# Patient Record
Sex: Female | Born: 1950
Health system: Southern US, Community
[De-identification: ages and names within clinical notes are randomized; demographics above are authoritative.]

## PROBLEM LIST (undated history)

## (undated) DIAGNOSIS — K219 Gastro-esophageal reflux disease without esophagitis: Secondary | ICD-10-CM

## (undated) DIAGNOSIS — I429 Cardiomyopathy, unspecified: Secondary | ICD-10-CM

## (undated) DIAGNOSIS — C50919 Malignant neoplasm of unspecified site of unspecified female breast: Secondary | ICD-10-CM

## (undated) DIAGNOSIS — E079 Disorder of thyroid, unspecified: Secondary | ICD-10-CM

## (undated) DIAGNOSIS — I454 Nonspecific intraventricular block: Secondary | ICD-10-CM

## (undated) DIAGNOSIS — J45909 Unspecified asthma, uncomplicated: Secondary | ICD-10-CM

## (undated) HISTORY — PX: BREAST SURGERY: SHX581

## (undated) HISTORY — PX: THYROIDECTOMY: SHX17

## (undated) HISTORY — PX: TONSILLECTOMY: SUR1361

## (undated) HISTORY — DX: Cardiomyopathy, unspecified: I42.9

## (undated) HISTORY — PX: TUBAL LIGATION: SHX77

## (undated) HISTORY — PX: STERILIZATION: SHX533

---

## 1998-10-14 DIAGNOSIS — I10 Essential (primary) hypertension: Secondary | ICD-10-CM

## 1998-10-14 HISTORY — DX: Essential (primary) hypertension: I10

## 1998-10-27 ENCOUNTER — Emergency Department (HOSPITAL_COMMUNITY): Admission: EM | Admit: 1998-10-27 | Discharge: 1998-10-28 | Payer: Self-pay | Admitting: Emergency Medicine

## 1998-10-28 ENCOUNTER — Encounter: Payer: Self-pay | Admitting: Emergency Medicine

## 2000-03-26 ENCOUNTER — Encounter: Payer: Self-pay | Admitting: Obstetrics and Gynecology

## 2000-03-26 ENCOUNTER — Encounter: Admission: RE | Admit: 2000-03-26 | Discharge: 2000-03-26 | Payer: Self-pay | Admitting: Obstetrics and Gynecology

## 2000-05-08 ENCOUNTER — Other Ambulatory Visit: Admission: RE | Admit: 2000-05-08 | Discharge: 2000-05-08 | Payer: Self-pay | Admitting: Obstetrics and Gynecology

## 2000-06-20 ENCOUNTER — Encounter: Payer: Self-pay | Admitting: Cardiology

## 2000-06-21 ENCOUNTER — Inpatient Hospital Stay (HOSPITAL_COMMUNITY): Admission: EM | Admit: 2000-06-21 | Discharge: 2000-06-23 | Payer: Self-pay | Admitting: Emergency Medicine

## 2000-06-21 ENCOUNTER — Encounter: Payer: Self-pay | Admitting: Cardiology

## 2001-03-31 ENCOUNTER — Encounter: Payer: Self-pay | Admitting: Obstetrics and Gynecology

## 2001-03-31 ENCOUNTER — Encounter: Admission: RE | Admit: 2001-03-31 | Discharge: 2001-03-31 | Payer: Self-pay | Admitting: Obstetrics and Gynecology

## 2001-05-18 ENCOUNTER — Other Ambulatory Visit: Admission: RE | Admit: 2001-05-18 | Discharge: 2001-05-18 | Payer: Self-pay | Admitting: Obstetrics and Gynecology

## 2001-07-28 ENCOUNTER — Encounter: Admission: RE | Admit: 2001-07-28 | Discharge: 2001-07-28 | Payer: Self-pay | Admitting: Family Medicine

## 2001-07-28 ENCOUNTER — Encounter: Payer: Self-pay | Admitting: Family Medicine

## 2001-08-03 ENCOUNTER — Encounter: Payer: Self-pay | Admitting: Family Medicine

## 2001-08-03 ENCOUNTER — Ambulatory Visit (HOSPITAL_COMMUNITY): Admission: RE | Admit: 2001-08-03 | Discharge: 2001-08-03 | Payer: Self-pay | Admitting: Family Medicine

## 2001-08-06 ENCOUNTER — Encounter: Payer: Self-pay | Admitting: Family Medicine

## 2001-08-06 ENCOUNTER — Encounter: Admission: RE | Admit: 2001-08-06 | Discharge: 2001-08-06 | Payer: Self-pay | Admitting: Family Medicine

## 2002-04-13 ENCOUNTER — Encounter: Payer: Self-pay | Admitting: Obstetrics and Gynecology

## 2002-04-13 ENCOUNTER — Encounter: Admission: RE | Admit: 2002-04-13 | Discharge: 2002-04-13 | Payer: Self-pay | Admitting: Obstetrics and Gynecology

## 2002-10-14 DIAGNOSIS — C50919 Malignant neoplasm of unspecified site of unspecified female breast: Secondary | ICD-10-CM

## 2002-10-14 HISTORY — PX: BREAST LUMPECTOMY: SHX2

## 2002-10-14 HISTORY — DX: Malignant neoplasm of unspecified site of unspecified female breast: C50.919

## 2003-05-24 ENCOUNTER — Encounter: Admission: RE | Admit: 2003-05-24 | Discharge: 2003-05-24 | Payer: Self-pay | Admitting: Obstetrics and Gynecology

## 2003-05-24 ENCOUNTER — Encounter: Payer: Self-pay | Admitting: Obstetrics and Gynecology

## 2003-05-30 ENCOUNTER — Encounter (INDEPENDENT_AMBULATORY_CARE_PROVIDER_SITE_OTHER): Payer: Self-pay | Admitting: *Deleted

## 2003-05-30 ENCOUNTER — Encounter: Payer: Self-pay | Admitting: Obstetrics and Gynecology

## 2003-05-30 ENCOUNTER — Encounter: Admission: RE | Admit: 2003-05-30 | Discharge: 2003-05-30 | Payer: Self-pay | Admitting: Obstetrics and Gynecology

## 2003-06-02 ENCOUNTER — Encounter (HOSPITAL_COMMUNITY): Admission: RE | Admit: 2003-06-02 | Discharge: 2003-08-31 | Payer: Self-pay | Admitting: Surgery

## 2003-06-02 ENCOUNTER — Encounter: Payer: Self-pay | Admitting: Surgery

## 2003-06-03 ENCOUNTER — Encounter: Payer: Self-pay | Admitting: Surgery

## 2003-06-08 ENCOUNTER — Encounter: Payer: Self-pay | Admitting: Surgery

## 2003-06-08 ENCOUNTER — Encounter: Admission: RE | Admit: 2003-06-08 | Discharge: 2003-06-08 | Payer: Self-pay | Admitting: Surgery

## 2003-06-09 ENCOUNTER — Encounter (INDEPENDENT_AMBULATORY_CARE_PROVIDER_SITE_OTHER): Payer: Self-pay | Admitting: *Deleted

## 2003-06-09 ENCOUNTER — Ambulatory Visit (HOSPITAL_BASED_OUTPATIENT_CLINIC_OR_DEPARTMENT_OTHER): Admission: RE | Admit: 2003-06-09 | Discharge: 2003-06-09 | Payer: Self-pay | Admitting: Surgery

## 2003-06-09 ENCOUNTER — Encounter: Admission: RE | Admit: 2003-06-09 | Discharge: 2003-06-09 | Payer: Self-pay | Admitting: Surgery

## 2003-06-09 ENCOUNTER — Encounter: Payer: Self-pay | Admitting: Surgery

## 2003-06-16 ENCOUNTER — Ambulatory Visit: Admission: RE | Admit: 2003-06-16 | Discharge: 2003-08-13 | Payer: Self-pay | Admitting: Radiation Oncology

## 2003-06-27 ENCOUNTER — Encounter: Admission: RE | Admit: 2003-06-27 | Discharge: 2003-06-27 | Payer: Self-pay | Admitting: Oncology

## 2003-06-27 ENCOUNTER — Encounter: Payer: Self-pay | Admitting: Oncology

## 2003-07-01 ENCOUNTER — Ambulatory Visit (HOSPITAL_COMMUNITY): Admission: RE | Admit: 2003-07-01 | Discharge: 2003-07-01 | Payer: Self-pay | Admitting: Oncology

## 2003-07-01 ENCOUNTER — Encounter: Payer: Self-pay | Admitting: Oncology

## 2003-10-04 ENCOUNTER — Ambulatory Visit: Admission: RE | Admit: 2003-10-04 | Discharge: 2003-11-30 | Payer: Self-pay | Admitting: Radiation Oncology

## 2003-10-11 ENCOUNTER — Encounter: Admission: RE | Admit: 2003-10-11 | Discharge: 2003-10-11 | Payer: Self-pay | Admitting: Radiation Oncology

## 2003-11-08 ENCOUNTER — Encounter: Admission: RE | Admit: 2003-11-08 | Discharge: 2003-11-08 | Payer: Self-pay | Admitting: Oncology

## 2003-11-14 ENCOUNTER — Ambulatory Visit (HOSPITAL_COMMUNITY): Admission: RE | Admit: 2003-11-14 | Discharge: 2003-11-14 | Payer: Self-pay | Admitting: Oncology

## 2003-11-26 ENCOUNTER — Emergency Department (HOSPITAL_COMMUNITY): Admission: EM | Admit: 2003-11-26 | Discharge: 2003-11-26 | Payer: Self-pay

## 2003-11-28 ENCOUNTER — Ambulatory Visit (HOSPITAL_COMMUNITY): Admission: RE | Admit: 2003-11-28 | Discharge: 2003-11-28 | Payer: Self-pay | Admitting: Gastroenterology

## 2003-12-29 ENCOUNTER — Ambulatory Visit: Admission: RE | Admit: 2003-12-29 | Discharge: 2003-12-29 | Payer: Self-pay | Admitting: Radiation Oncology

## 2004-05-29 ENCOUNTER — Encounter: Admission: RE | Admit: 2004-05-29 | Discharge: 2004-05-29 | Payer: Self-pay | Admitting: Surgery

## 2004-06-11 ENCOUNTER — Encounter: Admission: RE | Admit: 2004-06-11 | Discharge: 2004-06-11 | Payer: Self-pay | Admitting: Family Medicine

## 2004-07-12 ENCOUNTER — Ambulatory Visit: Admission: RE | Admit: 2004-07-12 | Discharge: 2004-07-12 | Payer: Self-pay | Admitting: Radiation Oncology

## 2004-09-28 ENCOUNTER — Inpatient Hospital Stay (HOSPITAL_COMMUNITY): Admission: EM | Admit: 2004-09-28 | Discharge: 2004-10-02 | Payer: Self-pay | Admitting: Emergency Medicine

## 2004-10-29 ENCOUNTER — Encounter: Admission: RE | Admit: 2004-10-29 | Discharge: 2004-10-29 | Payer: Self-pay | Admitting: Family Medicine

## 2004-11-13 ENCOUNTER — Ambulatory Visit: Payer: Self-pay | Admitting: Oncology

## 2005-02-20 ENCOUNTER — Encounter: Admission: RE | Admit: 2005-02-20 | Discharge: 2005-02-20 | Payer: Self-pay | Admitting: Family Medicine

## 2005-03-20 ENCOUNTER — Encounter: Admission: RE | Admit: 2005-03-20 | Discharge: 2005-03-20 | Payer: Self-pay | Admitting: Endocrinology

## 2005-03-20 ENCOUNTER — Other Ambulatory Visit: Admission: RE | Admit: 2005-03-20 | Discharge: 2005-03-20 | Payer: Self-pay | Admitting: Interventional Radiology

## 2005-03-20 ENCOUNTER — Encounter (INDEPENDENT_AMBULATORY_CARE_PROVIDER_SITE_OTHER): Payer: Self-pay | Admitting: *Deleted

## 2005-05-09 ENCOUNTER — Ambulatory Visit (HOSPITAL_COMMUNITY): Admission: RE | Admit: 2005-05-09 | Discharge: 2005-05-09 | Payer: Self-pay | Admitting: Oncology

## 2005-05-17 ENCOUNTER — Encounter (INDEPENDENT_AMBULATORY_CARE_PROVIDER_SITE_OTHER): Payer: Self-pay | Admitting: Specialist

## 2005-05-17 ENCOUNTER — Ambulatory Visit (HOSPITAL_COMMUNITY): Admission: RE | Admit: 2005-05-17 | Discharge: 2005-05-18 | Payer: Self-pay | Admitting: Surgery

## 2005-05-22 ENCOUNTER — Emergency Department (HOSPITAL_COMMUNITY): Admission: EM | Admit: 2005-05-22 | Discharge: 2005-05-23 | Payer: Self-pay | Admitting: Emergency Medicine

## 2005-05-30 ENCOUNTER — Encounter: Admission: RE | Admit: 2005-05-30 | Discharge: 2005-05-30 | Payer: Self-pay | Admitting: Oncology

## 2005-06-03 ENCOUNTER — Ambulatory Visit: Payer: Self-pay | Admitting: Oncology

## 2005-12-09 ENCOUNTER — Ambulatory Visit: Payer: Self-pay | Admitting: Oncology

## 2006-05-29 ENCOUNTER — Ambulatory Visit: Payer: Self-pay | Admitting: Oncology

## 2006-06-02 ENCOUNTER — Encounter: Admission: RE | Admit: 2006-06-02 | Discharge: 2006-06-02 | Payer: Self-pay | Admitting: Oncology

## 2006-06-03 LAB — CBC WITH DIFFERENTIAL/PLATELET
Basophils Absolute: 0 10*3/uL (ref 0.0–0.1)
HCT: 40.9 % (ref 34.8–46.6)
HGB: 14.2 g/dL (ref 11.6–15.9)
LYMPH%: 30.2 % (ref 14.0–48.0)
MCH: 29.8 pg (ref 26.0–34.0)
MCHC: 34.7 g/dL (ref 32.0–36.0)
MONO#: 0.4 10*3/uL (ref 0.1–0.9)
NEUT%: 60 % (ref 39.6–76.8)
Platelets: 231 10*3/uL (ref 145–400)
lymph#: 1.8 10*3/uL (ref 0.9–3.3)

## 2006-06-03 LAB — COMPREHENSIVE METABOLIC PANEL
BUN: 16 mg/dL (ref 6–23)
CO2: 23 mEq/L (ref 19–32)
Calcium: 9.1 mg/dL (ref 8.4–10.5)
Chloride: 105 mEq/L (ref 96–112)
Creatinine, Ser: 0.67 mg/dL (ref 0.40–1.20)
Total Bilirubin: 0.6 mg/dL (ref 0.3–1.2)

## 2006-06-03 LAB — CANCER ANTIGEN 27.29: CA 27.29: 7 U/mL (ref 0–39)

## 2006-07-17 ENCOUNTER — Ambulatory Visit (HOSPITAL_COMMUNITY): Admission: RE | Admit: 2006-07-17 | Discharge: 2006-07-17 | Payer: Self-pay | Admitting: Oncology

## 2006-12-08 ENCOUNTER — Ambulatory Visit: Payer: Self-pay | Admitting: Oncology

## 2006-12-10 LAB — COMPREHENSIVE METABOLIC PANEL
ALT: 11 U/L (ref 0–35)
AST: 12 U/L (ref 0–37)
CO2: 26 mEq/L (ref 19–32)
Creatinine, Ser: 0.63 mg/dL (ref 0.40–1.20)
Total Bilirubin: 0.5 mg/dL (ref 0.3–1.2)

## 2006-12-10 LAB — CANCER ANTIGEN 27.29: CA 27.29: 12 U/mL (ref 0–39)

## 2006-12-10 LAB — CBC WITH DIFFERENTIAL/PLATELET
BASO%: 0.5 % (ref 0.0–2.0)
EOS%: 3.1 % (ref 0.0–7.0)
HCT: 41.4 % (ref 34.8–46.6)
LYMPH%: 27.8 % (ref 14.0–48.0)
MCH: 29.8 pg (ref 26.0–34.0)
MCHC: 34.7 g/dL (ref 32.0–36.0)
MCV: 85.8 fL (ref 81.0–101.0)
MONO%: 7.3 % (ref 0.0–13.0)
NEUT%: 61.3 % (ref 39.6–76.8)
Platelets: 245 10*3/uL (ref 145–400)

## 2007-05-29 ENCOUNTER — Ambulatory Visit: Payer: Self-pay | Admitting: Oncology

## 2007-06-03 LAB — CBC WITH DIFFERENTIAL/PLATELET
Basophils Absolute: 0 10*3/uL (ref 0.0–0.1)
EOS%: 2 % (ref 0.0–7.0)
HCT: 37.8 % (ref 34.8–46.6)
HGB: 13.3 g/dL (ref 11.6–15.9)
MCH: 30.5 pg (ref 26.0–34.0)
NEUT%: 64 % (ref 39.6–76.8)
lymph#: 2.1 10*3/uL (ref 0.9–3.3)

## 2007-06-03 LAB — COMPREHENSIVE METABOLIC PANEL
AST: 14 U/L (ref 0–37)
BUN: 16 mg/dL (ref 6–23)
CO2: 24 mEq/L (ref 19–32)
Calcium: 9 mg/dL (ref 8.4–10.5)
Chloride: 105 mEq/L (ref 96–112)
Creatinine, Ser: 0.57 mg/dL (ref 0.40–1.20)
Glucose, Bld: 102 mg/dL — ABNORMAL HIGH (ref 70–99)

## 2007-06-03 LAB — CANCER ANTIGEN 27.29: CA 27.29: 5 U/mL (ref 0–39)

## 2007-06-05 ENCOUNTER — Encounter: Admission: RE | Admit: 2007-06-05 | Discharge: 2007-06-05 | Payer: Self-pay | Admitting: Oncology

## 2007-12-02 ENCOUNTER — Ambulatory Visit: Payer: Self-pay | Admitting: Oncology

## 2007-12-04 LAB — CBC WITH DIFFERENTIAL/PLATELET
Eosinophils Absolute: 0.2 10*3/uL (ref 0.0–0.5)
HCT: 40.4 % (ref 34.8–46.6)
HGB: 14.1 g/dL (ref 11.6–15.9)
LYMPH%: 30.6 % (ref 14.0–48.0)
MONO#: 0.5 10*3/uL (ref 0.1–0.9)
NEUT#: 3.5 10*3/uL (ref 1.5–6.5)
NEUT%: 57.9 % (ref 39.6–76.8)
Platelets: 237 10*3/uL (ref 145–400)
WBC: 6 10*3/uL (ref 3.9–10.0)
lymph#: 1.8 10*3/uL (ref 0.9–3.3)

## 2007-12-04 LAB — COMPREHENSIVE METABOLIC PANEL
ALT: 11 U/L (ref 0–35)
Alkaline Phosphatase: 47 U/L (ref 39–117)
CO2: 24 mEq/L (ref 19–32)
Creatinine, Ser: 0.62 mg/dL (ref 0.40–1.20)
Sodium: 141 mEq/L (ref 135–145)
Total Bilirubin: 0.4 mg/dL (ref 0.3–1.2)
Total Protein: 6.7 g/dL (ref 6.0–8.3)

## 2007-12-04 LAB — CANCER ANTIGEN 27.29: CA 27.29: 7 U/mL (ref 0–39)

## 2007-12-30 ENCOUNTER — Encounter: Admission: RE | Admit: 2007-12-30 | Discharge: 2007-12-30 | Payer: Self-pay | Admitting: Family Medicine

## 2008-01-06 ENCOUNTER — Encounter: Admission: RE | Admit: 2008-01-06 | Discharge: 2008-01-06 | Payer: Self-pay | Admitting: Family Medicine

## 2008-01-22 ENCOUNTER — Encounter: Admission: RE | Admit: 2008-01-22 | Discharge: 2008-01-22 | Payer: Self-pay | Admitting: Specialist

## 2008-05-29 ENCOUNTER — Ambulatory Visit: Payer: Self-pay | Admitting: Oncology

## 2008-06-02 LAB — CBC WITH DIFFERENTIAL/PLATELET
Basophils Absolute: 0 10*3/uL (ref 0.0–0.1)
EOS%: 3.8 % (ref 0.0–7.0)
HGB: 14 g/dL (ref 11.6–15.9)
LYMPH%: 29.8 % (ref 14.0–48.0)
MCH: 29.9 pg (ref 26.0–34.0)
MCV: 85.2 fL (ref 81.0–101.0)
MONO%: 8.9 % (ref 0.0–13.0)
RDW: 11.6 % (ref 11.3–14.5)

## 2008-06-03 LAB — COMPREHENSIVE METABOLIC PANEL
AST: 12 U/L (ref 0–37)
Alkaline Phosphatase: 48 U/L (ref 39–117)
BUN: 19 mg/dL (ref 6–23)
Creatinine, Ser: 0.71 mg/dL (ref 0.40–1.20)
Potassium: 4.3 mEq/L (ref 3.5–5.3)
Total Bilirubin: 0.4 mg/dL (ref 0.3–1.2)

## 2008-06-06 ENCOUNTER — Encounter: Admission: RE | Admit: 2008-06-06 | Discharge: 2008-06-06 | Payer: Self-pay | Admitting: Oncology

## 2008-11-29 ENCOUNTER — Ambulatory Visit: Payer: Self-pay | Admitting: Oncology

## 2008-12-01 ENCOUNTER — Encounter: Admission: RE | Admit: 2008-12-01 | Discharge: 2008-12-01 | Payer: Self-pay | Admitting: Oncology

## 2008-12-01 LAB — CBC WITH DIFFERENTIAL/PLATELET
BASO%: 0.1 % (ref 0.0–2.0)
EOS%: 3.1 % (ref 0.0–7.0)
HCT: 40.8 % (ref 34.8–46.6)
LYMPH%: 24.6 % (ref 14.0–48.0)
MCH: 29.9 pg (ref 26.0–34.0)
MCHC: 34 g/dL (ref 32.0–36.0)
MCV: 88 fL (ref 81.0–101.0)
MONO%: 7.1 % (ref 0.0–13.0)
NEUT%: 65.1 % (ref 39.6–76.8)
Platelets: 212 10*3/uL (ref 145–400)
lymph#: 2.2 10*3/uL (ref 0.9–3.3)

## 2008-12-01 LAB — COMPREHENSIVE METABOLIC PANEL
ALT: 18 U/L (ref 0–35)
AST: 18 U/L (ref 0–37)
Alkaline Phosphatase: 50 U/L (ref 39–117)
BUN: 24 mg/dL — ABNORMAL HIGH (ref 6–23)
Creatinine, Ser: 0.61 mg/dL (ref 0.40–1.20)
Total Bilirubin: 0.6 mg/dL (ref 0.3–1.2)

## 2009-05-04 ENCOUNTER — Ambulatory Visit: Payer: Self-pay | Admitting: Oncology

## 2009-05-09 LAB — CBC WITH DIFFERENTIAL/PLATELET
BASO%: 0.2 % (ref 0.0–2.0)
LYMPH%: 22.3 % (ref 14.0–49.7)
MCHC: 33.9 g/dL (ref 31.5–36.0)
MONO#: 0.6 10*3/uL (ref 0.1–0.9)
RBC: 4.53 10*6/uL (ref 3.70–5.45)
WBC: 9.7 10*3/uL (ref 3.9–10.3)
lymph#: 2.2 10*3/uL (ref 0.9–3.3)

## 2009-05-09 LAB — COMPREHENSIVE METABOLIC PANEL
ALT: 16 U/L (ref 0–35)
AST: 18 U/L (ref 0–37)
Alkaline Phosphatase: 33 U/L — ABNORMAL LOW (ref 39–117)
Calcium: 9.3 mg/dL (ref 8.4–10.5)
Chloride: 105 mEq/L (ref 96–112)
Creatinine, Ser: 0.75 mg/dL (ref 0.40–1.20)

## 2009-05-09 LAB — RESEARCH LABS

## 2009-06-07 ENCOUNTER — Encounter: Admission: RE | Admit: 2009-06-07 | Discharge: 2009-06-07 | Payer: Self-pay | Admitting: Oncology

## 2009-09-01 DIAGNOSIS — J029 Acute pharyngitis, unspecified: Secondary | ICD-10-CM

## 2009-09-01 HISTORY — DX: Acute pharyngitis, unspecified: J02.9

## 2009-10-20 ENCOUNTER — Ambulatory Visit: Payer: Self-pay | Admitting: Oncology

## 2009-10-25 LAB — CBC WITH DIFFERENTIAL/PLATELET
EOS%: 4.1 % (ref 0.0–7.0)
Eosinophils Absolute: 0.4 10*3/uL (ref 0.0–0.5)
LYMPH%: 26.7 % (ref 14.0–49.7)
MCH: 30.2 pg (ref 25.1–34.0)
MCHC: 33.4 g/dL (ref 31.5–36.0)
MCV: 90.2 fL (ref 79.5–101.0)
MONO%: 7.7 % (ref 0.0–14.0)
Platelets: 219 10*3/uL (ref 145–400)
RBC: 4.42 10*6/uL (ref 3.70–5.45)

## 2009-10-25 LAB — COMPREHENSIVE METABOLIC PANEL
AST: 19 U/L (ref 0–37)
Alkaline Phosphatase: 30 U/L — ABNORMAL LOW (ref 39–117)
Glucose, Bld: 85 mg/dL (ref 70–99)
Sodium: 137 mEq/L (ref 135–145)
Total Bilirubin: 0.8 mg/dL (ref 0.3–1.2)
Total Protein: 6.5 g/dL (ref 6.0–8.3)

## 2009-10-25 LAB — RESEARCH LABS

## 2009-10-25 LAB — VITAMIN D 25 HYDROXY (VIT D DEFICIENCY, FRACTURES): Vit D, 25-Hydroxy: 61 ng/mL (ref 30–89)

## 2010-01-25 DIAGNOSIS — J302 Other seasonal allergic rhinitis: Secondary | ICD-10-CM | POA: Insufficient documentation

## 2010-01-25 DIAGNOSIS — M899 Disorder of bone, unspecified: Secondary | ICD-10-CM | POA: Insufficient documentation

## 2010-01-25 DIAGNOSIS — R5383 Other fatigue: Secondary | ICD-10-CM | POA: Insufficient documentation

## 2010-01-25 DIAGNOSIS — E039 Hypothyroidism, unspecified: Secondary | ICD-10-CM | POA: Insufficient documentation

## 2010-01-25 HISTORY — DX: Hypothyroidism, unspecified: E03.9

## 2010-01-25 HISTORY — DX: Other seasonal allergic rhinitis: J30.2

## 2010-01-25 HISTORY — DX: Other fatigue: R53.83

## 2010-01-25 HISTORY — DX: Disorder of bone, unspecified: M89.9

## 2010-03-20 ENCOUNTER — Encounter: Payer: Self-pay | Admitting: Emergency Medicine

## 2010-03-20 DIAGNOSIS — R509 Fever, unspecified: Secondary | ICD-10-CM | POA: Insufficient documentation

## 2010-03-20 HISTORY — DX: Fever, unspecified: R50.9

## 2010-04-10 ENCOUNTER — Ambulatory Visit: Payer: Self-pay | Admitting: Emergency Medicine

## 2010-04-10 DIAGNOSIS — K219 Gastro-esophageal reflux disease without esophagitis: Secondary | ICD-10-CM

## 2010-04-10 DIAGNOSIS — C50919 Malignant neoplasm of unspecified site of unspecified female breast: Secondary | ICD-10-CM

## 2010-04-10 DIAGNOSIS — J45909 Unspecified asthma, uncomplicated: Secondary | ICD-10-CM | POA: Insufficient documentation

## 2010-04-10 DIAGNOSIS — R490 Dysphonia: Secondary | ICD-10-CM

## 2010-04-10 DIAGNOSIS — J309 Allergic rhinitis, unspecified: Secondary | ICD-10-CM | POA: Insufficient documentation

## 2010-04-10 HISTORY — DX: Malignant neoplasm of unspecified site of unspecified female breast: C50.919

## 2010-04-10 HISTORY — DX: Gastro-esophageal reflux disease without esophagitis: K21.9

## 2010-04-20 ENCOUNTER — Ambulatory Visit: Payer: Self-pay | Admitting: Emergency Medicine

## 2010-04-24 ENCOUNTER — Ambulatory Visit: Payer: Self-pay | Admitting: Emergency Medicine

## 2010-06-08 ENCOUNTER — Encounter: Admission: RE | Admit: 2010-06-08 | Discharge: 2010-06-08 | Payer: Self-pay | Admitting: Oncology

## 2010-08-06 ENCOUNTER — Ambulatory Visit: Payer: Self-pay | Admitting: Emergency Medicine

## 2010-11-13 NOTE — Letter (Signed)
Summary: [facility]  [facility]   Imported By: [employer] 04/26/2010 08:34:39  _____________________________________________________________________  External Attachment:    Type:   Image     Comment:   External Document

## 2010-11-13 NOTE — Miscellaneous (Signed)
Summary: Orders Update pft charges  Clinical Lists Changes  Orders: Added new Service order of Carbon Monoxide diffusing w/capacity (94720) - Signed Added new Service order of Lung Volumes (94240) - Signed Added new Service order of Spirometry (Pre & Post) (94060) - Signed 

## 2010-11-13 NOTE — Assessment & Plan Note (Signed)
Summary: asthma   Visit Type:  Follow-up Copy to:  Dr. Waynard Edwards Primary Provider/Referring Provider:  Dr. Rodrigo Ran  CC:  3 month follow up , review ventolin usage, and denies cough.  History of Present Illness: 60 yo woman, never smoker, hx allergic rhinitis and asthma. The asthma was dx as a child, was on meds at that time. Improved and then returned in her 20's. Has been very well controlled and intermittant for years. Then over the last 2 years she has had increased SABA use. Since December '10 she has at least 3 bouts of flaring, often when outside. Also woke her from sleep a few times, requiring her to use her SABA. Has been treated with pred. Three weeks ago she started with cough, non-prod, some PND, hoarse voice. Developed wheeze, lost her voice. Then was treated with prednisone. CXR clear 03/20/10. Many of the symptoms are better but still with cough and hoarse voice. Was restarted on Alvesco two times a day. Her SABA use has decreased since she started it.   Triggers seem to include cats, smoke, working outdoors in the heat, cleaning solutions, newspaper ink, perfumes. Has trouble with allergies more in the Fall.   ROS - also significant for some GERD symptoms. Has gained 20 lbs over the last 2 yrs.   ROV 04/24/10 -- returns for f/u. Cough and hoarse voice are better. Last time we stopped Alvesco, she may have lost some ground in that she is using her SABA 2 -3 days out of the week, rarely 2x a day. We didn't agressively treat GERD or allergies.   ROV 08/06/10 -- mild AFL, clinical asthma and UA iritation. UA symptoms are better. We kept a record of her SABA use, rarely used it 2x in a day, then would go many days without it. Cough and hoarse voice are better.   Preventive Screening-Counseling & Management  Alcohol-Tobacco     Smoking Status: never  Current Medications (verified): 1)  Tamoxifen Citrate 20 Mg Tabs (Tamoxifen Citrate) .Marland Kitchen.. 1 By Mouth Daily 2)  Benicar 20 Mg Tabs  (Olmesartan Medoxomil) .Marland Kitchen.. 1 By Mouth Daily 3)  Synthroid 112 Mcg Tabs (Levothyroxine Sodium) .Marland Kitchen.. 1 By Mouth Daily 4)  Aspirin 81 Mg Tabs (Aspirin) .Marland Kitchen.. 1 By Mouth Daily 5)  Multivitamins  Tabs (Multiple Vitamin) .Marland Kitchen.. 1 By Mouth Daily 6)  Fish Oil 1000 Mg Caps (Omega-3 Fatty Acids) .Marland Kitchen.. 1 By Mouth Daily 7)  Nystatin 100000 Unit/ml Susp (Nystatin) .Marland Kitchen.. 1 Tsp Four Times Daily As Needed 8)  Ventolin Hfa 108 (90 Base) Mcg/act Aers (Albuterol Sulfate) .... 2 Puffs Four Times Daily As Needed  Allergies (verified): 1)  ! * Sulfa  Vital Signs:  Patient profile:   60 year old female Height:      64 inches Weight:      167.6 pounds BMI:     28.87 O2 Sat:      94 % on Room air Temp:     98 degrees F oral Pulse rate:   94 / minute BP sitting:   140 / 80  (left arm)  Vitals Entered By: Renold Genta RCP, LPN (August 06, 2010 3:32 PM)  O2 Flow:  Room air CC: 3 month follow up , review ventolin usage, denies cough Comments Medications reviewed with patient Renold Genta RCP, LPN  August 06, 2010 3:36 PM    Physical Exam  General:  normal appearance and healthy appearing.   Head:  normocephalic and atraumatic Eyes:  conjunctiva and sclera  clear Nose:  no deformity, discharge, inflammation, or lesions Mouth:  no deformity or lesions, narrow post pharynx.  Neck:  no masses, thyromegaly, or abnormal cervical nodes, no stridor Lungs:  clear bilaterally to auscultation and percussion Heart:  regular rate and rhythm, S1, S2 without murmurs, rubs, gallops, or clicks Abdomen:  not-examined Msk:  no deformity or scoliosis noted with normal posture Extremities:  no clubbing, cyanosis, edema, or deformity noted Neurologic:  non-focal Psych:  alert and cooperative; normal mood and affect; normal attention span and concentration   Impression & Recommendations:  Problem # 1:  ASTHMA (ICD-493.90) Mild intermitant asthma.  - as needed SABA - rov 1 yr or as needed   Other Orders: Est.  Patient Level III (16109)  Patient Instructions: 1)  Please continue your Ventolin as needed  2)  Follow with Dr Delton Coombes in 1 year or as needed  Prescriptions: VENTOLIN HFA 108 (90 BASE) MCG/ACT AERS (ALBUTEROL SULFATE) 2 puffs four times daily as needed  #1 x 11   Entered and Authorized by:   Leslye Peer MD   Signed by:   Leslye Peer MD on 08/06/2010   Method used:   Electronically to        [facility]* (retail)       [address] [place], [place]  [address]       Ph: [phone] or [phone]       Fax: [phone]   RxID:   [id number]

## 2010-11-13 NOTE — Assessment & Plan Note (Signed)
Summary: asthma, UA irritation   Visit Type:  Follow-up Copy to:  Dr. Waynard Edwards Primary Provider/Referring Provider:  Dr. Rodrigo Ran  CC:  Asthma follow-up with PFt's. The patient says her voice came back within 4-5 days of stopping the Alvesco. She is using her Ventolin inhaler 3-4 times daily.Allison Clark  History of Present Illness: 60 yo woman, never smoker, hx allergic rhinitis and asthma. The asthma was dx as a child, was on meds at that time. Improved and then returned in her 20's. Has been very well controlled and intermittant for years. Then over the last 2 years she has had increased SABA use. Since December '10 she has at least 3 bouts of flaring, often when outside. Also woke her from sleep a few times, requiring her to use her SABA. Has been treated with pred. Three weeks ago she started with cough, non-prod, some PND, hoarse voice. Developed wheeze, lost her voice. Then was treated with prednisone. CXR clear 03/20/10. Many of the symptoms are better but still with cough and hoarse voice. Was restarted on Alvesco two times a day. Her SABA use has decreased since she started it.   Triggers seem to include cats, smoke, working outdoors in the heat, cleaning solutions, newspaper ink, perfumes. Has trouble with allergies more in the Fall.   ROS - also significant for some GERD symptoms. Has gained 20 lbs over the last 2 yrs.   ROV 04/24/10 -- returns for f/u. Cough and hoarse voice are better. Last time we stopped Alvesco, she may have lost some ground in that she is using her SABA 2 -3 days out of the week, rarely 2x a day. We didn't agressively treat GERD or allergies.   Current Medications (verified): 1)  Tamoxifen Citrate 20 Mg Tabs (Tamoxifen Citrate) .Allison Clark.. 1 By Mouth Daily 2)  Benicar 20 Mg Tabs (Olmesartan Medoxomil) .Allison Clark.. 1 By Mouth Daily 3)  Synthroid 112 Mcg Tabs (Levothyroxine Sodium) .Allison Clark.. 1 By Mouth Daily 4)  Aspirin 81 Mg Tabs (Aspirin) .Allison Clark.. 1 By Mouth Daily 5)  Multivitamins  Tabs  (Multiple Vitamin) .Allison Clark.. 1 By Mouth Daily 6)  Fish Oil 1000 Mg Caps (Omega-3 Fatty Acids) .Allison Clark.. 1 By Mouth Daily 7)  Nystatin 100000 Unit/ml Susp (Nystatin) .Allison Clark.. 1 Tsp Four Times Daily As Needed 8)  Ventolin Hfa 108 (90 Base) Mcg/act Aers (Albuterol Sulfate) .... 2 Puffs Four Times Daily As Needed  Allergies (verified): 1)  ! * Sulfa  Vital Signs:  Patient profile:   60 year old female Height:      64 inches (162.56 cm) Weight:      165 pounds (75 kg) BMI:     28.42 O2 Sat:      92 % on Room air Temp:     98.2 degrees F (36.78 degrees C) oral Pulse rate:   74 / minute BP sitting:   130 / 70  (right arm) Cuff size:   regular  O2 Sat at Rest %:  92 O2 Flow:  Room air CC: Asthma follow-up with PFt's. The patient says her voice came back within 4-5 days of stopping the Alvesco. She is using her Ventolin inhaler 3-4 times daily. Comments Medications reviewed. Daytime phone verofied. Michel Bickers [facility]  April 24, 2010 3:37 PM   Physical Exam  General:  normal appearance and healthy appearing.   Head:  normocephalic and atraumatic Eyes:  conjunctiva and sclera clear Nose:  no deformity, discharge, inflammation, or lesions Mouth:  no deformity or lesions, narrow post pharynx.  Neck:  no masses, thyromegaly, or abnormal cervical nodes, no stridor Lungs:  clear bilaterally to auscultation and percussion Heart:  regular rate and rhythm, S1, S2 without murmurs, rubs, gallops, or clicks Abdomen:  not-examined Msk:  no deformity or scoliosis noted with normal posture Extremities:  no clubbing, cyanosis, edema, or deformity noted Neurologic:  non-focal Psych:  alert and cooperative; normal mood and affect; normal attention span and concentration   Pulmonary Function Test Date: 04/20/2010 Height (in.): 64 Gender: Female  Pre-Spirometry FVC    Value: 2.49 L/min   Pred: 3.09 L/min     % Pred: 80 % FEV1    Value: 1.82 L     Pred: 2.29 L     % Pred: 80 % FEV1/FVC  Value: 73 %     Pred: 74  %    FEF 25-75  Value: 1.39 L/min   Pred: 2.62 L/min     % Pred: 53 %  Post-Spirometry FVC    Value: 2.92 L/min   Pred: 3.09 L/min     % Pred: 95 % FEV1    Value: 2.13 L     Pred: 2.29 L     % Pred: 93 % FEV1/FVC  Value: 73 %     Pred: 74 %    FEF 25-75  Value: 1.63 L/min   Pred: 2.62 L/min     % Pred: 62 %  Lung Volumes TLC    Value: 4.48 L   % Pred: 91 % RV    Value: 1.88 L   % Pred: 102 % DLCO    Value: 20.5 %   % Pred: 95 % DLCO/VA  Value: 4.87 %   % Pred: 130 %  Comments: Mild AFL based on BD reponsiveness. Volumes and DLCO normal. RSB  Impression & Recommendations:  Problem # 1:  ASTHMA (ICD-493.90) Confirmed on PFT today. Seems to fall into mild intermittant category although she has periods wher her SABA use goes up.  - asked her to concentrate on trigger avoidance (cats, cleaning products, etc) - keep a log of albuterol use to review next time - SABA as needed  - rov 3 mo  Problem # 2:  HOARSENESS (ZOX-096.04)  Improved. She will be high risk for recurrance in setting GERD, allergies.   Orders: Est. Patient Level IV (54098)  Medications Added to Medication List This Visit: 1)  Ventolin Hfa 108 (90 Base) Mcg/act Aers (Albuterol sulfate) .... 2 puffs four times daily as needed  Patient Instructions: 1)  Use your albuterol inhaler up to every 4 hours if needed for shortness of breath 2)  Do not restart Alvesco  3)  Follow up with Dr Delton Coombes in three months with a log of how frequently you are using your albuterol.  4)  Call our office to be seen sooner if you have worsening of your breathing or any other problems.

## 2010-11-13 NOTE — Assessment & Plan Note (Signed)
Summary: asthma, hoarseness   Visit Type:  Initial Consult Copy to:  Dr. Waynard Edwards Primary Provider/Referring Provider:  Dr. Rodrigo Ran  CC:  Pulmonary consult for asthma.  The patient c/o asthma flare. Increased sob  due to warmer weather..  History of Present Illness: 60 yo woman, never smoker, hx allergic rhinitis and asthma. The asthma was dx as a child, was on meds at that time. Improved and then returned in her 20's. Has been very well controlled and intermittant for years. Then over the last 2 years she has had increased SABA use. Since December '10 she has at least 3 bouts of flaring, often when outside. Also woke her from sleep a few times, requiring her to use her SABA. Has been treated with pred. Three weeks ago she started with cough, non-prod, some PND, hoarse voice. Developed wheeze, lost her voice. Then was treated with prednisone. CXR clear 03/20/10. Many of the symptoms are better but still with cough and hoarse voice. Was restarted on Alvesco two times a day. Her SABA use has decreased since she started it.   Triggers seem to include cats, smoke, working outdoors in the heat, cleaning solutions, newspaper ink, perfumes. Has trouble with allergies more in the Fall.   ROS - also significant for some GERD symptoms. Has gained 20 lbs over the last 2 yrs.   Preventive Screening-Counseling & Management  Alcohol-Tobacco     Alcohol drinks/day: 0     Smoking Status: never  Current Medications (verified): 1)  Tamoxifen Citrate 20 Mg Tabs (Tamoxifen Citrate) .Marland Kitchen.. 1 By Mouth Daily 2)  Benicar 20 Mg Tabs (Olmesartan Medoxomil) .Marland Kitchen.. 1 By Mouth Daily 3)  Synthroid 112 Mcg Tabs (Levothyroxine Sodium) .Marland Kitchen.. 1 By Mouth Daily 4)  Aspirin 81 Mg Tabs (Aspirin) .Marland Kitchen.. 1 By Mouth Daily 5)  Alvesco 80 Mcg/act Aers (Ciclesonide) .Marland Kitchen.. 1 Puff Two Times A Day 6)  Multivitamins  Tabs (Multiple Vitamin) .Marland Kitchen.. 1 By Mouth Daily 7)  Fish Oil 1000 Mg Caps (Omega-3 Fatty Acids) .Marland Kitchen.. 1 By Mouth Daily 8)   Nystatin 100000 Unit/ml Susp (Nystatin) .Marland Kitchen.. 1 Tsp Four Times Daily As Needed  Allergies (verified): 1)  ! * Sulfa  Past History:  Past Medical History: Current Problems:  Hx of BREAST CANCER (ICD-174.9) ASTHMA (ICD-493.90) ALLERGIC RHINITIS (ICD-477.9)  Past Surgical History: Lumpectomy in left breast in 2004 Tonsillectomy age 57 Tubal Ligation in 1986 Thyroidectomy in 2006  Family History: Family History Prostate Cancer---brother Heart disease---mother Clotting disorder---mother  Social History: Patient never smoked.  Married Works as an Camera operator drinks/day:  0 Smoking Status:  never  Review of Systems       The patient complains of shortness of breath with activity, shortness of breath at rest, productive cough, weight change, sore throat, nasal congestion/difficulty breathing through nose, and change in color of mucus.  The patient denies non-productive cough, coughing up blood, chest pain, irregular heartbeats, acid heartburn, indigestion, loss of appetite, abdominal pain, difficulty swallowing, tooth/dental problems, headaches, sneezing, itching, ear ache, anxiety, depression, hand/feet swelling, joint stiffness or pain, rash, and fever.    Vital Signs:  Patient profile:   60 year old female Height:      63 inches (160.02 cm) Weight:      164 pounds (74.55 kg) BMI:     29.16 O2 Sat:      95 % on Room air Temp:     98.3 degrees F (36.83 degrees C) oral Pulse rate:   92 / minute BP sitting:  130 / 76  (right arm) Cuff size:   regular  Vitals Entered By: Michel Bickers CMA (April 10, 2010 1:58 PM)  O2 Sat at Rest %:  95 O2 Flow:  Room air CC: Pulmonary consult for asthma.  The patient c/o asthma flare. Increased sob  due to warmer weather. Comments Medications reviewed. Michel Bickers [facility]  April 10, 2010 1:59 PM   Physical Exam  General:  normal appearance and healthy appearing.   Head:  normocephalic and atraumatic Eyes:  conjunctiva and  sclera clear Nose:  no deformity, discharge, inflammation, or lesions Mouth:  no deformity or lesions, narrow post pharynx.  Neck:  no masses, thyromegaly, or abnormal cervical nodes, no stridor Lungs:  clear bilaterally to auscultation and percussion Heart:  regular rate and rhythm, S1, S2 without murmurs, rubs, gallops, or clicks Abdomen:  not-examined Msk:  no deformity or scoliosis noted with normal posture Extremities:  no clubbing, cyanosis, edema, or deformity noted Neurologic:  non-focal Psych:  alert and cooperative; normal mood and affect; normal attention span and concentration   Impression & Recommendations:  Problem # 1:  ASTHMA (ICD-493.90) - need full PFTs - may need control of factors that are driving her asthma - allergies, GERD  Problem # 2:  HOARSENESS (MWN-027.25)  ? whether related to the Alvesco. Suspect that her allergies are worse than she describes. She also has intermittant bouts of GERD - may be affecting her UA disease nad her asthma. At this time she doesn't want to treat either allergies or GERD. She is willing to hold the Alvesco in case this med is affecting her UA.  - hold Alvesco temporarily. Consider a substitute if her hoarseness improves  Orders: Consultation Level IV (36644)  Problem # 3:  ALLERGIC RHINITIS (ICD-477.9)  Problem # 4:  GERD (ICD-530.81)  Medications Added to Medication List This Visit: 1)  Tamoxifen Citrate 20 Mg Tabs (Tamoxifen citrate) .Marland Kitchen.. 1 by mouth daily 2)  Benicar 20 Mg Tabs (Olmesartan medoxomil) .Marland Kitchen.. 1 by mouth daily 3)  Synthroid 112 Mcg Tabs (Levothyroxine sodium) .Marland Kitchen.. 1 by mouth daily 4)  Aspirin 81 Mg Tabs (Aspirin) .Marland Kitchen.. 1 by mouth daily 5)  Alvesco 80 Mcg/act Aers (Ciclesonide) .Marland Kitchen.. 1 puff two times a day 6)  Multivitamins Tabs (Multiple vitamin) .Marland Kitchen.. 1 by mouth daily 7)  Fish Oil 1000 Mg Caps (Omega-3 fatty acids) .Marland Kitchen.. 1 by mouth daily 8)  Nystatin 100000 Unit/ml Susp (Nystatin) .Marland Kitchen.. 1 tsp four times daily as  needed  Patient Instructions: 1)  Please stop your Alvesco until your next visit 2)  Continue to have your ventolin available to use as needed  3)  Call our office if your breathin worsens off the Alvesco 4)  Follow up with Dr Delton Coombes in 2 weeks. 5)  Full PFT's either on the day of your follow up appointment or prior to that appointment.

## 2011-01-17 ENCOUNTER — Encounter (HOSPITAL_BASED_OUTPATIENT_CLINIC_OR_DEPARTMENT_OTHER): Payer: Managed Care, Other (non HMO) | Admitting: Oncology

## 2011-01-17 ENCOUNTER — Other Ambulatory Visit: Payer: Self-pay | Admitting: Oncology

## 2011-01-17 DIAGNOSIS — C50419 Malignant neoplasm of upper-outer quadrant of unspecified female breast: Secondary | ICD-10-CM

## 2011-01-17 LAB — CBC WITH DIFFERENTIAL/PLATELET
Basophils Absolute: 0.1 10*3/uL (ref 0.0–0.1)
Eosinophils Absolute: 0.3 10*3/uL (ref 0.0–0.5)
HGB: 12.7 g/dL (ref 11.6–15.9)
MCV: 88.6 fL (ref 79.5–101.0)
NEUT#: 5.6 10*3/uL (ref 1.5–6.5)
RDW: 13.1 % (ref 11.2–14.5)
lymph#: 2.7 10*3/uL (ref 0.9–3.3)

## 2011-01-17 LAB — COMPREHENSIVE METABOLIC PANEL
Albumin: 4 g/dL (ref 3.5–5.2)
BUN: 21 mg/dL (ref 6–23)
Calcium: 9 mg/dL (ref 8.4–10.5)
Chloride: 102 mEq/L (ref 96–112)
Glucose, Bld: 95 mg/dL (ref 70–99)
Potassium: 4 mEq/L (ref 3.5–5.3)

## 2011-01-18 LAB — VITAMIN D 25 HYDROXY (VIT D DEFICIENCY, FRACTURES): Vit D, 25-Hydroxy: 87 ng/mL (ref 30–89)

## 2011-01-31 ENCOUNTER — Encounter (HOSPITAL_BASED_OUTPATIENT_CLINIC_OR_DEPARTMENT_OTHER): Payer: Managed Care, Other (non HMO) | Admitting: Oncology

## 2011-01-31 DIAGNOSIS — C50419 Malignant neoplasm of upper-outer quadrant of unspecified female breast: Secondary | ICD-10-CM

## 2011-01-31 DIAGNOSIS — M899 Disorder of bone, unspecified: Secondary | ICD-10-CM

## 2011-01-31 DIAGNOSIS — Z170 Estrogen receptor positive status [ER+]: Secondary | ICD-10-CM

## 2011-02-25 ENCOUNTER — Ambulatory Visit: Payer: Managed Care, Other (non HMO) | Admitting: Adult Health

## 2011-03-01 NOTE — Procedures (Signed)
[place]  Patient:    Allison Clark, Allison Clark                      MRN: [id number] Proc. Date: 06/23/00 Adm. Date:  [id number] Attending:  Norman Clay CC:         Darden Palmer., M.D.   Procedure Report  PROCEDURE PERFORMED:  Esophagogastroduodenoscopy with biopsy  INDICATION FOR PROCEDURE:  Epigastric abdominal pain with negative CT scan and abdominal ultrasound and failure to respond strongly to Proton pump inhibitor, although with some relief by antacids and milk.  DESCRIPTION OF PROCEDURE:  The patient was placed in the left lateral decubitus position and placed on the pulse monitor with continuous low flow oxygen delivered by nasal cannula.  She was sedated with 50 mg of IV Demerol and 5 mg of IV Versed.  The Olympus video endoscope was advanced under direct vision into the oropharynx and esophagus.  The esophagus was straight and of normal caliber with the squamocolumnar line at 38 cm.  There was no visible hiatal hernia, ring, stricture or other abnormality of the GE junction.  The stomach was entered and a small amount of liquid secretions were suctioned from the fundus.  Retroflex view of the cardia was unremarkable.  The fundus, body, antrum and pylorus all appeared normal with the exception of some mild scattered areas of erythema, possibly consistent with a minimal gastritis. The pylorus was nondeformed and easily allowed passive of the endoscope tip into the duodenum.  Both bulb and second portion were well inspected and appeared to be within normal limits.  The scope was withdrawn back into the stomach and a CLOtest obtained.  The scope was then withdrawn and the patient returned to the recovery room in stable condition.  She tolerated the procedure well and there were no immediate complications.  IMPRESSION: Questionable mild antral gastritis, otherwise normal endoscopy.  PLAN:  Will continue Proton pump  inhibitor with p.r.n. Carafate or antacids and will treat her for Helicobacter of CLOtest positive. DD:  06/23/00 TD:  06/23/00 Job: [id number]

## 2011-03-01 NOTE — H&P (Signed)
NAME:  Allison Clark, Allison Clark               ACCOUNT NO.:  [id number]   MEDICAL RECORD NO.:  [id number]          PATIENT TYPE:  INP   LOCATION:  2018                         FACILITY:  [facility]   PHYSICIAN:  Kela Millin, M.D.DATE OF BIRTH:  01-Dec-1950   DATE OF ADMISSION:  09/28/2004  DATE OF DISCHARGE:                                HISTORY & PHYSICAL   PRIMARY CARE PHYSICIAN:  Donia Guiles, M.D.   CHIEF COMPLAINT:  Shortness of breath.   HISTORY OF PRESENT ILLNESS:  The patient is a 60 year old white female with  a past medical history significant for asthma, history of breast cancer,  status post lumpectomy, chemotherapy and radiation, currently on Arimidex,  hypertension, also recently diagnosed with a sinus infection and started on  antibiotics as well as Flonase, who presents with the above complaints x 1  day.  The patient states that about 2-3 days ago, she saw her primary care  physician for URI symptoms and was started on antibiotics and Flonase as  already stated.  On the day prior to presentation, she began coughing,  productive of a small amount of yellow sputum and overnight she was awakened  by the cough and began feeling short of breath and so came to the ER for  further evaluation.  She states that she has had nasal congestion,  rhinorrhea, and headaches.  The patient also admits to chest pain described  as a pressure, a 7 out of 10 in intensity, left precordial in location,  associated with nausea.  No radiation and no vomiting.  She admits to  associated shortness of breath as already discussed.  Denies diaphoresis.  Allison Clark states that she had a cardiac catheterization, in September 2001,  by Dr. Donnie Aho, and this was within normal limits.  She was later diagnosed  with GERD by Dr. Madilyn Fireman and treated with proton pump inhibitors which she  took for some time and discontinued.  The patient denies fevers, dysuria,  melena, hematochezia, hematemesis, and no PND.  The  patient reports leg pain  that is more in her left leg and reports that some time ago she was  evaluated for a DVT by her oncologist, Dr. [name], and that was within  normal limits.  The patient was evaluated in the ER and her D-dimmer was  elevated and also her first set of point-of-care enzymes, the troponin was  elevated and the patient is admitted to the [facility] for  further evaluation and management.   PAST MEDICAL HISTORY:  1.  As stated above.  2.  Seasonal allergies.  3.  History of GERD.  4.  History of left bundle branch block.   MEDICATIONS:  1.  Lisinopril 20 mg p.o. every day.  2.  Amoxicillin 500 mg p.o. b.i.d.  3.  Flonase two sprays every day.  4.  Arimidex 1 mg p.o. every day.  5.  Albuterol p.r.n.   ALLERGIES:  SULFUR.   SOCIAL HISTORY:  Denies tobacco, also denies alcohol.   FAMILY HISTORY:  Her mother had hypertension, MI at age 64, stroke, and  blood clots.  REVIEW OF SYSTEMS:  As per HPI, other comprehensive review of systems  negative.   PHYSICAL EXAMINATION:  VITAL SIGNS:  Temperature 98.2, blood pressure  128/76, pulse 89, respiratory rate initially 26 and now 18, O2 saturation  96%.  GENERAL:  The patient is a middle-aged white female in no acute distress,  acutely ill-appearing, well developed, well nourished.  HEENT:  PERRL.  EOMI.  Facial tenderness over her right maxillary sinus and  also over her left frontal area.  Sclerae anicteric.  Moist mucous  membranes.  No oral exudates.  NECK:  Supple.  No adenopathy.  No JVD.  No carotid bruits appreciated.  LUNGS:  Few wheezes bilaterally.  No crackles.  CARDIOVASCULAR:  Regular rate and rhythm.  Normal S1 S2.  No S3 appreciated.  ABDOMEN:  Soft.  Bowel sounds present.  Nontender.  Nondistended.  No  organomegaly and no masses palpable.  EXTREMITIES:  No edema.  No cyanosis.  There is left calf tenderness  present.  NEURO:  Alert and oriented x 3.  Cranial nerves II-XII  grossly intact.  Nonfocal exam.   LABORATORY DATA:  Chest x-ray:  Borderline cardiomegaly, mild vascular  congestion.   Her white cell count is 9.4, hemoglobin is 15, hematocrit 43.3%, platelet  count is 199, and the neutrophil count is 64%.  Her sodium is 141, potassium  3.3, chloride 111, BUN is 11, glucose is 118.  Her pH is 7.44, pCO2 is 34.2.  The D-dimmer is 0.69.  Her CK-MB is less than 1.  Her troponin is 0.35.  Her  myoglobin is 38.2.   ASSESSMENT/PLAN:  1.  Shortness of breath.  Differential diagnoses include pulmonary embolus,      versus cardiac, versus asthma, versus pneumonia.  The patient has a      history of asthma and breast cancer, currently on Arimidex and she is      also complaining of chest pain.  As already discussed her D-dimmer is      elevated, and her initial troponin I was elevated.  I will obtain a CT      angiogram to evaluate for pulmonary embolus.  Chest x-ray as already      discussed shows mild vascular congestion.  We will check a B-natriuretic      peptide as well.  We will obtain serial cardiac enzymes to rule out MI.      The patient started on aspirin, nitroglycerin p.r.n.  Follow and consult      cardiology pending enzymes if appropriate.  We will obtain an ABG.  The      patient started on bronchodilators and supplemental oxygen as clinically      indicated.  2.  Sinusitis.  Antibiotics, Zithromax, we will also add antihistamines,      antitussives, and continue Flonase.  3.  History of asthma.  Bronchodilators as already discussed.  Follow ABG as      well as a CT angio for any lung infiltrates.  4.  Hypertension.  Continue Lisinopril.  5.  History of gastroesophageal reflux disease.  Protonix, follow.  6.  Leg pain.  We will obtain lower extremity Doppler studies to evaluate      for deep vein thrombosis.  7.  Hypokalemia.  We will replace potassium.  8.  Deep vein thrombosis prophylaxis.  Lovenox. 9.  History of breast cancer.  We will  hold Arimidex for now.  Follow and      resume when appropriate.  The  patient is followed by Dr. [name].  10. History of left bundle branch block.  Follow.       ACV/MEDQ  D:  09/29/2004  T:  09/29/2004  Job:  [id number]   cc:   Donia Guiles, M.D.  [address] [place]  [place] [address]  Fax: [phone]

## 2011-03-01 NOTE — Op Note (Signed)
NAME:  Allison Clark, Allison Clark               ACCOUNT NO.:  [id number]   MEDICAL RECORD NO.:  [id number]          PATIENT TYPE:  OIB   LOCATION:  1432                         FACILITY:  [facility]   PHYSICIAN:  Currie Paris, M.D.DATE OF BIRTH:  Jul 21, 1951   DATE OF PROCEDURE:  05/17/2005  DATE OF DISCHARGE:                                 OPERATIVE REPORT   PREOPERATIVE DIAGNOSIS:  Multinodular goiter gradually enlarging.   POSTOPERATIVE DIAGNOSIS:  Multinodular goiter gradually enlarging.   OPERATION:  Total thyroidectomy with autotransplant of right inferior  parathyroid.   SURGEON:  Dr. Jamey Ripa   ASSISTANT:  Dr. Carolynne Edouard   ANESTHESIA:  General endotracheal.   CLINICAL HISTORY:  This is a 60 year old lady who has recently been  evaluated for a multinodular goiter which has gotten larger.  Because of  increasing symptoms, it was recommended to her that she proceed to  thyroidectomy.   DESCRIPTION OF PROCEDURE:  The patient was seen in the holding area and had  no further questions.  She confirmed total thyroidectomy as planned  procedure.  She was taken to the operating room and after satisfactory  general endotracheal anesthesia had been obtained, the neck was prepped and  draped.  The time-out occurred.   I made a transverse and cervical incision about 1 fingerbreadth above the  clavicle and divided the platysma with the cautery.  Some platysma flaps  were raised in the usual fashion and a self-retaining retractor placed.  The  prethyroid fascia was opened in the midline.  The left lobe was the largest  and most symptomatic, so it was approached first.  I elevated the strap  muscles off of the thyroid with moderate difficulty as they were fairly  stuck to the superior pole.  I was able to mobilize this up and around.  I  started out inferiorly and found in the plane of the surgical plane of the  thyroid.  I divided that some with the cautery and some small vessels with  clips.  I saw  the left inferior parathyroid and was able to dissect that off  the thyroid, push it back down posteriorly, and then continued to come a  little bit further towards the middle of the gland and the middle vein which  was divided.  At this point, I stopped and got better visualization of the  superior pole, was able to dissect that out and strip it down so that we  just had the superior pole vessels, and these were ligated in continuity  with 2-0 silk plus a clip.  I also divided a little bit of tissue medial to  that so we got better mobilization and then mobilized the entire left lobe  out medially so I could see the area of the recurrent laryngeal nerve.  I  also saw what appeared to be a superior parathyroid gland and again up on  the thyroid and was able to get in the plane of the thyroid, divide that,  and some small vessels and push the parathyroid posteriorly.  I also  identified the recurrent laryngeal nerve and made  sure that we avoided  injury to it.  Once I had the nerve identified, I was able to safely divide  the inferior pole vessels coming more close to the midline.  These were also  divided, some tied, and others clipped as needed.  This gave [place] even more  mobility to rotate the gland medially, and I continued to divide it off of  the thyroid, off of ligament of berry, close to the nerve and continue to  rotate it medially until I got across the midline.  There was a small  bleeding vessel right at the nerve, and I put a piece of Surgical on that.  We made sure everything appeared to be dry, and I replaced the left lobe  into its anatomic position and approached the right lobe.   The right lobe was removed pretty much in an identical fashion.  Again, the  superior pole vessels were divided close to the thyroid just divided those  without any other structures.  The middle vein was clipped and divided.  The  recurrent laryngeal nerve was carefully identified, dissected, and  made sure  that we stayed away from it.  What appeared to be a right superior  parathyroid was able to be preserved by staying up in the surgical plane and  pushing it back.  What appeared to be the right inferior parathyroid was  just basically attached by a blood vessel that appeared to be almost  directly coming out of the thyroid.  There was no way to salvage this and  leave it with its normal blood supply, so the vessel was divided and the  gland temporarily placed in saline.  I continued to rotate the gland  medially until we could find and divide off of the artery and, again, we had  a small amount of bleeding right at some tiny vessels near the nerve, and I  left some Surgicel there, and we completed the thyroidectomy by dividing the  remaining attachments to the thyroid gland, primarily using cautery.  The  specimen was sent to pathology.  I irrigated the right side and left some  more Surgicel back by where the small bleeder was near the nerve and looked  at the left side.  This was copiously irrigated and appeared to be dry.  I  replaced some Surgicel by the nerve.  On the right side, we came back and  even a though it had a little bit of pressure on the nerve, it was still  bleeding, so I put some small amount of FloSeal on this and then a piece of  Surgicel, left that for several minutes, and then rechecked and it was  absolutely completely dry.  Final irrigation showed no evidence of other  bleeding.  Again, I left a small piece of Surgicel along this area.   At this point the small parathyroid gland was minced and implanted into the  right sternocleidomastoid muscle.   The wound was closed with 3-0 Vicryl to close the midline fascia, 3-0 Vicryl  on the platysma, and staples and Steri-Strips on the skin.   The patient tolerated the procedure well.  There no operative complications,  and all counts were correct.      CJS/MEDQ  D:  05/17/2005  T:  05/18/2005  Job:   [id number]   cc:   Donia Guiles, M.D.  [address] [place]  [place] [address]  Fax: [phone]   Dorisann Frames, M.D.  [address]  [address] [place] [address]  Fax: [phone]

## 2011-03-01 NOTE — Discharge Summary (Signed)
NAME:  Allison Clark, Allison Clark               ACCOUNT NO.:  [id number]   MEDICAL RECORD NO.:  [id number]          PATIENT TYPE:  INP   LOCATION:  2018                         FACILITY:  [facility]   PHYSICIAN:  Corinna L. Lendell Caprice, MDDATE OF BIRTH:  [birth year]   DATE OF ADMISSION:  09/28/2004  DATE OF DISCHARGE:  10/01/2004                                 DISCHARGE SUMMARY   DIAGNOSES:  1.  Left lower lobe pneumonia.  2.  Pleurisy.  3.  Asthma exacerbation.  4.  Left leg pain, most likely muscle spasm.  5.  Hypertension.  6.  History of breast cancer.  7.  Left bundle branch block.  8.  Sinusitis.   DISCHARGE MEDICATIONS:  1.  Ceftin 500 mg p.o. b.i.d. for 6 more days.  2.  Tylenol or ibuprofen as needed for leg and chest pain.  3.  She is to stop amoxicillin.  4.  Tussionex 5 mL p.o. q.12 h. p.r.n. cough.   She may continue her other home medications which include:  1.  Lisinopril 20 mg p.o. daily.  2.  Flonase 2 sprays to each nostril daily.  3.  Arimidex daily.  4.  Albuterol MDI 2 puffs q.4 h. p.r.n.   FOLLOWUP:  Follow up with Dr. Donia Guiles in 2-4 weeks.   ACTIVITY:  No heavy exertion for a week.  She may return to work in a week.   DIET:  She is encouraged to drink adequate fluids.   PROCEDURES:  None.   PERTINENT LABORATORIES:  UA negative.  Urine culture grew out E. coli,  60,000 colonies.  Serial cardiac enzymes were negative with the exception of  a single troponin of 0.35 and an hour later was normal at less than 0.05.  ___________ test normal.  BNP normal.  Complete metabolic panel on admission  was significant for a potassium of 3.3, which was adequately repleted.  CBC  was unremarkable.  ABG on 2 L nasal cannula revealed a pH of 7.439, PCO2 of  38, PO2 of 68.   SPECIAL STUDIES AND RADIOLOGY:  Chest x-ray and CT angiogram of the chest  showed left lower lobe pneumonia, no pulmonary emboli, mildly progressive  thyroid goiter, reactive lymph node, little change in  previously  demonstrated low-density areas in the spleen are no longer seen.   X-ray of the left tib-fib negative.  EKG showed left bundle branch block,  sinus tachycardia.  Dopplers of the legs were negative for DVT.   HISTORY AND HOSPITAL COURSE:  Allison Clark is a pleasant 60 year old white  female patient of Dr. Donia Guiles, who had been on amoxicillin as an  outpatient for sinusitis.  She presented short of breath.  She has a history  of asthma.  She had a cough productive of yellowish sputum.  She also had  chest pain which was fairly pleuritic in nature.  She had normal vital signs  on admission.  She was admitted and after the CAT scan ruled out pulmonary  embolus, she was started on Rocephin and azithromycin.  She was given  nebulizer treatments.  She ruled out  for a myocardial infarction.  She also  complained of chronic episodic left calf pain, sometimes worse at night.  She had no DVT by Doppler.  X-ray was negative as well.  She reports that it  feels like a cramping sensation and that Tylenol helps this.  At the time of  discharge, her shortness of breath had resolved, she had no wheezing and she  had normal vital signs, ambulating and is stable for discharge.      Cori   CLS/MEDQ  D:  10/02/2004  T:  10/03/2004  Job:  [id number]   cc:   Donia Guiles, M.D.  [address] [place]  [place] [address]  Fax: [phone]

## 2011-05-13 ENCOUNTER — Other Ambulatory Visit (INDEPENDENT_AMBULATORY_CARE_PROVIDER_SITE_OTHER): Payer: Self-pay | Admitting: Surgery

## 2011-05-13 DIAGNOSIS — Z1231 Encounter for screening mammogram for malignant neoplasm of breast: Secondary | ICD-10-CM

## 2011-06-18 ENCOUNTER — Inpatient Hospital Stay (HOSPITAL_COMMUNITY)
Admission: EM | Admit: 2011-06-18 | Discharge: 2011-06-23 | DRG: 194 | Disposition: A | Discharge status: Home / Self Care | Payer: Managed Care, Other (non HMO) | Attending: Internal Medicine | Admitting: Internal Medicine

## 2011-06-18 ENCOUNTER — Emergency Department (HOSPITAL_COMMUNITY): Payer: Managed Care, Other (non HMO)

## 2011-06-18 DIAGNOSIS — T368X5A Adverse effect of other systemic antibiotics, initial encounter: Secondary | ICD-10-CM | POA: Diagnosis present

## 2011-06-18 DIAGNOSIS — Z853 Personal history of malignant neoplasm of breast: Secondary | ICD-10-CM

## 2011-06-18 DIAGNOSIS — J45901 Unspecified asthma with (acute) exacerbation: Secondary | ICD-10-CM | POA: Diagnosis present

## 2011-06-18 DIAGNOSIS — I447 Left bundle-branch block, unspecified: Secondary | ICD-10-CM | POA: Diagnosis present

## 2011-06-18 DIAGNOSIS — J189 Pneumonia, unspecified organism: Principal | ICD-10-CM | POA: Diagnosis present

## 2011-06-18 DIAGNOSIS — M949 Disorder of cartilage, unspecified: Secondary | ICD-10-CM | POA: Diagnosis present

## 2011-06-18 DIAGNOSIS — I10 Essential (primary) hypertension: Secondary | ICD-10-CM | POA: Diagnosis present

## 2011-06-18 DIAGNOSIS — M899 Disorder of bone, unspecified: Secondary | ICD-10-CM | POA: Diagnosis present

## 2011-06-18 DIAGNOSIS — E890 Postprocedural hypothyroidism: Secondary | ICD-10-CM | POA: Diagnosis present

## 2011-06-18 DIAGNOSIS — R0789 Other chest pain: Secondary | ICD-10-CM | POA: Diagnosis present

## 2011-06-18 LAB — POCT I-STAT TROPONIN I

## 2011-06-18 LAB — CARDIAC PANEL(CRET KIN+CKTOT+MB+TROPI)
CK, MB: 1.7 ng/mL (ref 0.3–4.0)
Relative Index: INVALID (ref 0.0–2.5)
Relative Index: INVALID (ref 0.0–2.5)
Total CK: 38 U/L (ref 7–177)
Troponin I: <0.3 ng/mL (ref <0.30)

## 2011-06-18 LAB — COMPREHENSIVE METABOLIC PANEL
Albumin: 3.8 g/dL (ref 3.5–5.2)
Alkaline Phosphatase: 42 U/L (ref 39–117)
BUN: 14 mg/dL (ref 6–23)
Creatinine, Ser: 0.55 mg/dL (ref 0.50–1.10)
GFR calc Af Amer: >60 mL/min (ref >60)
Glucose, Bld: 99 mg/dL (ref 70–99)
Sodium: 140 mEq/L (ref 135–145)
Total Bilirubin: 0.2 mg/dL — ABNORMAL LOW (ref 0.3–1.2)
Total Protein: 7 g/dL (ref 6.0–8.3)

## 2011-06-18 LAB — CBC
HCT: 40.8 % (ref 36.0–46.0)
Hemoglobin: 13.7 g/dL (ref 12.0–15.0)
RBC: 4.63 MIL/uL (ref 3.87–5.11)
WBC: 16.7 10*3/uL — ABNORMAL HIGH (ref 4.0–10.5)

## 2011-06-18 LAB — BLOOD GAS, ARTERIAL
Acid-base deficit: 3.4 mmol/L — ABNORMAL HIGH (ref 0.0–2.0)
Drawn by: 103701
O2 Content: 4 L/min
O2 Saturation: 96.1 %
pCO2 arterial: 34.3 mmHg — ABNORMAL LOW (ref 35.0–45.0)
pO2, Arterial: 112 mmHg — ABNORMAL HIGH (ref 80.0–100.0)

## 2011-06-18 LAB — D-DIMER, QUANTITATIVE: D-Dimer, Quant: 0.45 ug/mL-FEU (ref 0.00–0.48)

## 2011-06-19 ENCOUNTER — Inpatient Hospital Stay (HOSPITAL_COMMUNITY): Payer: Managed Care, Other (non HMO)

## 2011-06-19 LAB — BASIC METABOLIC PANEL
BUN: 12 mg/dL (ref 6–23)
CO2: 22 mEq/L (ref 19–32)
Calcium: 8.7 mg/dL (ref 8.4–10.5)
Creatinine, Ser: 0.5 mg/dL (ref 0.50–1.10)
GFR calc non Af Amer: >60 mL/min (ref >60)
Glucose, Bld: 164 mg/dL — ABNORMAL HIGH (ref 70–99)

## 2011-06-19 LAB — PHOSPHORUS: Phosphorus: 2.1 mg/dL — ABNORMAL LOW (ref 2.3–4.6)

## 2011-06-19 LAB — CARDIAC PANEL(CRET KIN+CKTOT+MB+TROPI): CK, MB: 2 ng/mL (ref 0.3–4.0)

## 2011-06-19 LAB — CBC
HCT: 38.1 % (ref 36.0–46.0)
Hemoglobin: 13 g/dL (ref 12.0–15.0)
MCH: 29.9 pg (ref 26.0–34.0)
MCHC: 34.1 g/dL (ref 30.0–36.0)
MCV: 87.6 fL (ref 78.0–100.0)
RBC: 4.35 MIL/uL (ref 3.87–5.11)

## 2011-06-19 LAB — MAGNESIUM: Magnesium: 2.2 mg/dL (ref 1.5–2.5)

## 2011-06-20 LAB — CBC
Hemoglobin: 11.7 g/dL — ABNORMAL LOW (ref 12.0–15.0)
MCHC: 33.1 g/dL (ref 30.0–36.0)
RBC: 3.98 MIL/uL (ref 3.87–5.11)
WBC: 28 10*3/uL — ABNORMAL HIGH (ref 4.0–10.5)

## 2011-06-20 LAB — BASIC METABOLIC PANEL
Chloride: 106 mEq/L (ref 96–112)
GFR calc non Af Amer: >60 mL/min (ref >60)
Glucose, Bld: 105 mg/dL — ABNORMAL HIGH (ref 70–99)
Potassium: 3.6 mEq/L (ref 3.5–5.1)
Sodium: 139 mEq/L (ref 135–145)

## 2011-06-21 LAB — COMPREHENSIVE METABOLIC PANEL
ALT: 13 U/L (ref 0–35)
Alkaline Phosphatase: 43 U/L (ref 39–117)
CO2: 26 mEq/L (ref 19–32)
GFR calc Af Amer: >60 mL/min (ref >60)
GFR calc non Af Amer: >60 mL/min (ref >60)
Glucose, Bld: 83 mg/dL (ref 70–99)
Potassium: 3.9 mEq/L (ref 3.5–5.1)
Sodium: 140 mEq/L (ref 135–145)
Total Bilirubin: 0.2 mg/dL — ABNORMAL LOW (ref 0.3–1.2)

## 2011-06-21 LAB — DIFFERENTIAL
Basophils Absolute: 0 10*3/uL (ref 0.0–0.1)
Basophils Relative: 0 % (ref 0–1)
Lymphocytes Relative: 21 % (ref 12–46)
Monocytes Relative: 7 % (ref 3–12)
Neutro Abs: 12.2 10*3/uL — ABNORMAL HIGH (ref 1.7–7.7)
Neutrophils Relative %: 70 % (ref 43–77)

## 2011-06-21 LAB — CBC
Hemoglobin: 12.3 g/dL (ref 12.0–15.0)
RBC: 4.19 MIL/uL (ref 3.87–5.11)

## 2011-06-22 ENCOUNTER — Inpatient Hospital Stay (HOSPITAL_COMMUNITY): Payer: Managed Care, Other (non HMO)

## 2011-06-22 DIAGNOSIS — I447 Left bundle-branch block, unspecified: Secondary | ICD-10-CM

## 2011-06-22 DIAGNOSIS — R079 Chest pain, unspecified: Secondary | ICD-10-CM

## 2011-06-22 LAB — BASIC METABOLIC PANEL
BUN: 17 mg/dL (ref 6–23)
Chloride: 102 mEq/L (ref 96–112)
GFR calc Af Amer: >60 mL/min (ref >60)
Glucose, Bld: 145 mg/dL — ABNORMAL HIGH (ref 70–99)
Potassium: 4.4 mEq/L (ref 3.5–5.1)

## 2011-06-22 LAB — CBC
HCT: 40.6 % (ref 36.0–46.0)
Hemoglobin: 13.8 g/dL (ref 12.0–15.0)
RDW: 13.4 % (ref 11.5–15.5)
WBC: 17.1 10*3/uL — ABNORMAL HIGH (ref 4.0–10.5)

## 2011-06-23 LAB — CBC
Hemoglobin: 12.7 g/dL (ref 12.0–15.0)
MCH: 29.1 pg (ref 26.0–34.0)
RBC: 4.36 MIL/uL (ref 3.87–5.11)
WBC: 14.2 10*3/uL — ABNORMAL HIGH (ref 4.0–10.5)

## 2011-06-23 LAB — BASIC METABOLIC PANEL
CO2: 28 mEq/L (ref 19–32)
Calcium: 8.7 mg/dL (ref 8.4–10.5)
Chloride: 101 mEq/L (ref 96–112)
Glucose, Bld: 82 mg/dL (ref 70–99)
Sodium: 138 mEq/L (ref 135–145)

## 2011-06-24 ENCOUNTER — Ambulatory Visit: Payer: Managed Care, Other (non HMO)

## 2011-06-24 NOTE — Consult Note (Signed)
NAMEMarland Kitchen  Clark, Allison [name]:  [id number]  MEDICAL RECORD NO.:  [id number]  LOCATION:  1415                         FACILITY:  [facility]  PHYSICIAN:  Madolyn Frieze. Jens Som, MD, FACCDATE OF BIRTH:  1951/04/07  DATE OF CONSULTATION:  06/22/2011 DATE OF DISCHARGE:                                CONSULTATION   Allison Clark is a pleasant 60 year old female with past medical history of asthma, hypertension, left bundle branch block, breast cancer.  I am asked to evaluate for chest pain and intermittent left bundle branch block.  The patient has seen Dr. Tresa Endo in the past.  An echocardiogram in September 2001, showed preserved LV function with septal motion consistent with left bundle branch block.  She also apparently had a cardiac catheterization as well, but I do not have those records available.  Per the patient, there was no coronary artery disease noted. The patient has a long history of asthma.  Last weekend, she developed viral type symptoms followed by increased shortness of breath.  She also had a chest tightness that was diffuse in nature.  It did not radiate to her neck, left upper extremity, abdomen, or back.  She did have shortness of breath, but no nausea or diaphoresis.  Her symptoms were similar to her previous asthma exacerbations.  She was admitted and has been treated with bronchodilators, steroids, and antibiotics.  She has had some improvement.  Note; when she receives her albuterol treatments, her heart rate increases and she is noted to have a rate related left bundle branch block.  She has had a left bundle branch block in the past, which is why she had a previous cardiac workup.  When she is not having asthma exacerbation, she does have dyspnea on exertion, which is a chronic issue.; However, there is no orthopnea, PND, or pedal edema. She does not have exertional chest pain.  Her medications at present include: 1. Albuterol 2.5 mg inhaled t.i.d. 2.  Aspirin 81 mg daily p.o. q. day. 3. Rocephin. 4. Vitamin D3. 5. Enoxaparin 40 mg subcutaneously daily. 6. Flonase. 7. Claritin. 8. Multivitamin. 9. Benicar 20 mg daily. 10.Lovaza. 11.Protonix. 12.Prednisone 40 mg p.o. q. day. 13.Sudafed. 14.Tamoxifen. 15.__________ medications.  ALLERGIES:  She has an allergy to SULFA and AVELOX.  SOCIAL HISTORY:  She does not smoke and only rarely consumes alcohol.  FAMILY HISTORY:  Positive for a myocardial infarction in her mother in her 22s.  PAST MEDICAL HISTORY:  Significant for hypertension, but there is no diabetes mellitus or hyperlipidemia.  She has had a previous thyroidectomy because of goiter.  She has a long history of asthma.  She has a history of left bundle branch block with negative workup in the past.  She also has a history of breast cancer, has had a previous lumpectomy, chemotherapy, and radiation.  She has a history of gastroesophageal reflux disease and osteoarthritis.  She has had a prior tonsillectomy.  She has had a prior tubal ligation.  REVIEW OF SYSTEMS:  She denies any headaches or fevers or chills.  She has had rhinorrhea and increased shortness of breath and a cough.  There is no hemoptysis.  There is no dysphagia,  odynophagia, melena, or hematochezia.  There is no dysuria or hematuria.  There is no rash or seizure activity.  There is no orthopnea, PND, or pedal edema.  She does have arthritis in her knees.  Remaining systems are negative.  PHYSICAL EXAMINATION:  VITAL SIGNS:  Today shows a blood pressure of 138/78.  Pulse is 80.  Temperature is 98.5.  She is 92% on room air. GENERAL:  She is well-developed and well-nourished.  No acute distress. SKIN:  Warm and dry.  She __________ .  There is no peripheral clubbing. BACK:  Normal. HEENT:  Normal.  Normal eyelids. NECK:  Supple with normal upstrokes bilaterally.  No bruits.  There is no jugular venous distention.  I cannot appreciate thyromegaly. CHEST:   Shows diminished breath sounds throughout with mild expiratory wheeze. CARDIOVASCULAR:  Regular rate rhythm with normal S1 and S2.  No murmurs, rubs, or gallops noted. ABDOMEN:  Nontender, nondistended.  Positive bowel sounds.  No hepatosplenomegaly.  No mass appreciated.  There is no abdominal bruit. She has 2+ femoral pulses bilaterally.  No bruits. EXTREMITIES:  Show no edema.  I could palpable no cords.  She has 2+ posterior tibial pulses bilaterally. NEUROLOGIC:  Grossly intact.  LABORATORY DATA:  Show sodium of 140, potassium of 3.9.  BUN and creatinine are 19 and 0.49 respectively.  Liver functions are normal. White blood cell count is 17.5 with a hemoglobin 12.3, hematocrit 36.9. Platelet count is 254.  Cardiac markers had been negative.  A D-dimer was normal on June 18, 2011.  A chest x-ray from June 19, 2011, shows improved lung volumes with bilateral basilar densities, most likely representing atelectasis.  There is possible small bilateral pleural effusions.  Electrocardiogram from June 18, 2011, showed probable sinus tachycardia with left bundle branch block.  Review of her telemetry shows a sinus rhythm with an intermittent left bundle branch block.  DIAGNOSES: 1. Chest pain - the patient's symptoms are atypical.  They are most     likely related to her asthma flares that she has had similar     symptoms in the past with her asthma exacerbations.  Her enzymes     are negative.  After she is treated for her asthma, she can follow     up with Dr. Tresa Endo for consideration of outpatient stress test. 2. Intermittent left bundle branch block - the patient does have a     rate related left bundle branch block.  She has had a left bundle     diagnosed in the past with a previous negative evaluation as     outlined in the HPI.  Her elevation in heart rate is most likely     secondary to her albuterol along with her asthma exacerbation.  I     agree     with changing  albuterol to Xopenex.  Otherwise, no further workup     at this time. 3. Hypertension - she will continue on her Benicar. 4. Asthma - I agree with steroids, bronchodilators, and antibiotics.     Madolyn Frieze Jens Som, MD, [facility]     BSC/MEDQ  D:  06/22/2011  T:  06/22/2011  Job:  [id number]  Electronically Signed by Olga Millers MD [facility] on 06/24/2011 07:31:10 PM

## 2011-06-25 NOTE — Discharge Summary (Signed)
NAMEMarland Kitchen  Allison Clark, [name]:  [id number]  MEDICAL RECORD NO.:  [id number]  LOCATION:  1415                         FACILITY:  [facility]  PHYSICIAN:  Allison Clark, M.D.  DATE OF BIRTH:  07/12/1951  DATE OF ADMISSION:  06/18/2011 DATE OF DISCHARGE:  06/23/2011                              DISCHARGE SUMMARY   DISCHARGE DIAGNOSES: 1. Acute asthma exacerbation. 2. Left lower lobe pneumonia. 3. Atypical chest pain, likely secondary to acute asthma exacerbation. 4. Intermittent left bundle branch block. 5. Hypertension. 6. Seasonal allergic rhinitis. 7. Breast cancer, status post lumpectomy, chemotherapy, radiation, and     ongoing tamoxifen therapy (diagnosed in 2004). 8. Osteoarthritis. 9. Osteopenia. 10.Hypothyroidism, status post thyroidectomy (2006).  DISCHARGE MEDICATIONS: 1. Augmentin 875 mg b.i.d. for 7 days. 2. Tylenol 650 q.6 h. p.r.n. pain. 3. Claritin 10 mg daily as needed for allergies. 4. Nystatin swish and swallow 5 cc q.i.d. for 1 week. 5. Prednisone taper as directed. 6. QVAR 1 puff b.i.d. 7. Saline nasal spray. 8. Xopenex HFA 2 puffs q.6 h. p.r.n. (to replace albuterol). 9. Flora Q probiotics daily. 10.Aspirin 81 mg daily. 11.Benicar 20 mg daily. 12.Fish oil 1 capsule daily. 13.Flonase 2 sprays each nostril daily. 14.Multivitamin daily. 15.Synthroid 112 mcg daily. 16.Tamoxifen 20 mg daily. 17.Vitamin D 400 units daily.  HOSPITAL CONSULTS: 1. Cardiology Arlys John S. Jens Som, MD, [facility]). 2. Chest x-ray on September 4th, September 5th, and September 8th     showing possible left lower lobe pneumonia versus atelectasis,     resolved on followup x-rays.  HISTORY OF PRESENT ILLNESS:  For full details, please see dictated history and physical.  Briefly, Allison Clark is a 60 year old white female with a history of asthma, seasonal allergic rhinitis, and hypertension who presented to the emergency department with a complaint of shortness of breath  for 24 hours.  The patient states that she developed increasing allergy symptoms 2 days prior to admission.  She subsequently developed increasing cough, shortness of breath, and began using her albuterol more frequently.  She had associated wheezing and green/yellow sputum production.  Her symptoms worsened overnight, on the morning of admission prompting her to come to the emergency department where she complained of a left-sided sharp pain that was worse with a deep breath. She was treated with nebulizers, prednisone, Rocephin, and Avelox in the emergency department with some improvement.  However, given her persistent symptoms she was admitted for further management.  HOSPITAL COURSE:  The patient was admitted to a telemetry bed.  She was placed on IV Solu-Medrol and scheduled nebulizers with albuterol and Atrovent.  She did have a local reaction to Avelox in the emergency department, so she was placed on Rocephin and azithromycin to cover possible left lower lobe community-acquired pneumonia.  With this, she had slow improvement in her wheezing, shortness of breath, cough, and sputum production.  She was transitioned to oral steroids. She continued to have periodic episodes of sharp left-sided chest pain that was worse with a deep breath.  On the day prior to discharge, she had an exaggerated episode with some associated sweating which correlated with a change on her telemetry monitor from a narrow complex rhythm to  a wide complex left bundle branch block.  She does have a known left bundle branch block in the past.  However, given her telemetry changes and recurrent chest pain, Cardiology was consulted.  She was seen by Dr. Olga Millers with [facility] covering for Dr. Donnie Aho who confirmed that her pain was likely related to her asthma exacerbation. He did not feel that further evaluation of her intermittent left bundle branch block was warranted given the known prior  history of left bundle branch block.  He did suggest follow up with Dr. Donnie Aho to consider an outpatient Myoview.  I did decide to switch her albuterol to Xopenex given the propensity for cardiac irritability.  With this, she felt better with no further episodes.  At this point, the patient is feeling much better and denies chest pain, palpitations, or shortness of breath.  Her wheezing and cough has significantly improved and she is stable for discharge home.  DISCHARGE LABORATORY DATA:  CBC shows a white count of 14.2 which is improved, hemoglobin 12.7, platelets 249.  BMET shows sodium 138, potassium 3.7, chloride 101, bicarbonate 28, BUN 22, creatinine 0.56, glucose 82.  Magnesium and phosphorus have been normal.  Cardiac enzymes were negative x3.  D-dimer 0.45 (normal) on admission.  Arterial blood gas on admission showed a pH of 7.39, pCO2 of 34.3, pO2 of 112.  Liver function test on admission were normal.  DISCHARGE DIET:  Cardiac prudent.  DISCHARGE INSTRUCTIONS:  She was instructed to call if she has increasing shortness of breath, cough, wheezing, or chest pain.  HOSPITAL FOLLOWUP:  She will follow up with Dr. Waynard Edwards at [facility] within the next 2 weeks.  She should call for an appointment.  CONDITION ON DISCHARGE:  Improved.  DISPOSITION:  To home.     Allison Clark, M.D.     WS/MEDQ  D:  06/23/2011  T:  06/23/2011  Job:  [id number]  cc:   Georga Hacking, M.D. Fax: [phone] Email: [email or web address]  Mark A. Perini, M.D. Fax: [phone]  Electronically Signed by Lacretia Nicks. Buren Kos M.D. on 06/25/2011 09:43:01 PM

## 2011-06-25 NOTE — H&P (Signed)
NAME:  Allison Clark, Allison [name]:  [id number]  MEDICAL RECORD NO.:  [id number]  LOCATION:  WLED                         FACILITY:  [facility]  PHYSICIAN:  Kari Baars, M.D.  DATE OF BIRTH:  11/22/1950  DATE OF ADMISSION:  06/18/2011 DATE OF DISCHARGE:                             HISTORY & PHYSICAL   CHIEF COMPLAINT:  Shortness of breath.  HISTORY OF PRESENT ILLNESS:  Mrs. Allison Clark is a 60 year old white female with a history of asthma and hypertension, who presented to the emergency department with a complaint of shortness of breath for the past 24 hours.  She reports increasing allergy symptoms developing about 2 days ago which were initially improved with Claritin.  Yesterday, she noticed increasing cough and shortness of breath, and began using her albuterol more frequently.  She did have some wheezing and sputum production associated with this.  Overnight, her symptoms worsened and she developed worsening shortness of breath and increasing left-sided chest pain and tenderness.  She describes this as a sharp pain over her left breast which was worse with a deep breath.  She states her symptoms were unrelieved with albuterol which she took three to four times overnight, prompting her to come to the emergency department.  In the emergency department she has received prednisone 60 mg, nebulizers x2, Rocephin, and Avelox, and is experiencing some improvement in her symptoms.  She continues to have shortness of breath and chest pain albeit improved.  At baseline, she does have longstanding asthma since childhood.  She has been hospitalized one time previously for an asthma exacerbation.  At baseline she does not use albuterol more than 1 or 2 times per month. She has not been on an inhaled corticosteroid as she has not needed this.  Her symptoms are usually triggered by allergies which are worse in the spring in the fall.  REVIEW OF SYSTEMS:  All systems reviewed with  the patient were negative except in HPI with following exceptions:  She does have allergies with increasing sinus drainage and sore throat.  No fevers, chills, or sweats.  PAST MEDICAL HISTORY: 1. Asthma. 2. Seasonal allergic rhinitis. 3. Hypertension. 4. Chronic left bundle-branch block with reported heart     catheterization about 10 years ago by Dr. Viann Clark which was     normal. 5. Breast cancer, status post lumpectomy, chemotherapy, radiation, and     on chronic tamoxifen therapy (diagnosed in 2004). 6. Osteoarthritis. 7. Osteopenia. 8. Status post thyroidectomy (2006).  CURRENT MEDICATIONS: 1. Aspirin 81 mg daily. 2. Ventolin p.r.n. 3. Benicar 20 mg daily. 4. Synthroid 112 mcg daily. 5. Tamoxifen 20 mg daily. 6. Flonase p.r.n. 7. Claritin p.r.n.  ALLERGIES:  SULFA CAUSES A RASH, ARIMIDEX CAUSES RESTLESS LEG SYNDROME, and ALVESCO CAUSED HOARSENESS.  SOCIAL HISTORY:  She is married, and has three children and nine grandchildren.  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 No tobacco, alcohol, or drug use.  FAMILY HISTORY:  Father died of a cirrhosis at 52.  Mother died of an MI at 29 and had a stroke in her 52s.  She has a brother with prostate cancer.  PHYSICAL EXAMINATION:  VITAL SIGNS:  Temperature 98.5,  blood pressure 134/64, pulse 115, respirations 27, and oxygen saturation 93% on 4 liters. GENERAL:  She is anxious and tachypneic at rest. HEENT:  Pupils are equal, round, and reactive to light.  Extraocular movements are intact.  Oropharynx moist. NECK:  Supple without lymphadenopathy, JVD, or carotid bruits. HEART:  Tachycardiac without murmurs, rubs or gallops. LUNGS:  Minimal bilateral wheezing throughout. ABDOMEN:  Soft, nondistended, and nontender with normoactive bowel sounds. EXTREMITIES:  No clubbing, cyanosis, or edema.  LABORATORY DATA:  CBC shows a white count of 16.7,hemoglobin 13.7, and platelets 243.  BMET significant for  sodium 140, potassium 3.8, chloride 104, bicarbonate 24, BUN 14, creatinine 0.5, and glucose 99.  Liver function tests are normal.  Troponin 0.00.  STUDIES: 1. Chest x-ray personally reviewed shows a slight left lower lobe     opacity which may indicate pneumonia.  Mild blunting of the left     costophrenic angle may be a small effusion.  Airway thickening     consistent with reactive airways disease. 2. EKG:  Her first EKG shows sinus tachycardia with an incomplete left     bundle-branch block with repolarization changes including inferior     lateral ST depression with T-wave inversion.  Her second EKG shows     a left bundle-branch block which is old.  ASSESSMENT/PLAN: 1. Asthma exacerbation secondary to probable left lower lobe pneumonia-     her increasing shortness of breath, pleuritic chest pain,     leukocytosis, and hypoxia are most consistent with an asthma     exacerbation in the setting of pneumonia.  Much less likely is a     pulmonary embolism as she does not have any risks other than     tamoxifen therapy.  She will be treated with IV steroids,     nebulizers, and IV antibiotics.  We will obtain an ABG to rule out     carbon dioxide to rule out hypercarbia.  Repeat chest x-ray in the     morning to further evaluate pneumonia.  If her symptoms fail to     improve with this treatment, consider CT scan to rule out pulmonary     embolism, though again I feel this is less likely. 2. Pleuritic chest pain-her history is most consistent with pleuritic     chest pain secondary to her asthma.  It is clearly worse with deep     breath.  The EKG changes are noted.  She does have an old left     bundle-branch block.  Although her initial EKG showed a narrow QRS     complex; the second EKG is consistent with her known left bundle-     branch block.  We will rule out myocardial infarction with cardiac     enzymes and place on telemetry.  She had a prior cardiac     catheterization  about 10 years ago by Dr. Donnie Clark that was negative.     Should further     cardiac evaluation be warranted.  She would desire Dr. York Clark     input. 3. Hypertension-continue Benicar. 4. Deep venous thrombus prophylaxis with Lovenox.     Kari Baars, M.D.     WS/MEDQ  D:  06/18/2011  T:  06/18/2011  Job:  [id number]  cc:   Leslye Peer, MD [address] [place], [place] [address]  Loraine Leriche A. Perini, M.D. Fax: [phone]  W. Allison Clark, M.D. Fax: [phone] Email: [email or web address]  Electronically Signed by  W. Buren Kos M.D. on 06/25/2011 09:42:55 PM

## 2011-06-28 ENCOUNTER — Ambulatory Visit
Admission: RE | Admit: 2011-06-28 | Discharge: 2011-06-28 | Disposition: A | Discharge status: Home / Self Care | Payer: Managed Care, Other (non HMO) | Source: Ambulatory Visit | Attending: Surgery | Admitting: Surgery

## 2011-06-28 DIAGNOSIS — Z1231 Encounter for screening mammogram for malignant neoplasm of breast: Secondary | ICD-10-CM

## 2011-07-01 DIAGNOSIS — I493 Ventricular premature depolarization: Secondary | ICD-10-CM

## 2011-07-01 HISTORY — DX: Ventricular premature depolarization: I49.3

## 2011-08-20 ENCOUNTER — Ambulatory Visit: Payer: Managed Care, Other (non HMO) | Admitting: Emergency Medicine

## 2011-11-20 ENCOUNTER — Other Ambulatory Visit: Payer: Self-pay | Admitting: Oncology

## 2011-11-20 ENCOUNTER — Telehealth: Payer: Self-pay | Admitting: Oncology

## 2011-11-20 DIAGNOSIS — C50919 Malignant neoplasm of unspecified site of unspecified female breast: Secondary | ICD-10-CM

## 2011-11-20 NOTE — Telephone Encounter (Signed)
S/w the pt and she is aware of her April 2013 appts °

## 2012-01-28 ENCOUNTER — Other Ambulatory Visit (HOSPITAL_BASED_OUTPATIENT_CLINIC_OR_DEPARTMENT_OTHER): Payer: Managed Care, Other (non HMO) | Admitting: Lab

## 2012-01-28 DIAGNOSIS — C50919 Malignant neoplasm of unspecified site of unspecified female breast: Secondary | ICD-10-CM

## 2012-01-28 LAB — COMPREHENSIVE METABOLIC PANEL
CO2: 25 mEq/L (ref 19–32)
Calcium: 9 mg/dL (ref 8.4–10.5)
Chloride: 104 mEq/L (ref 96–112)
Creatinine, Ser: 0.59 mg/dL (ref 0.50–1.10)
Glucose, Bld: 88 mg/dL (ref 70–99)
Total Bilirubin: 0.3 mg/dL (ref 0.3–1.2)

## 2012-01-28 LAB — CBC WITH DIFFERENTIAL/PLATELET
Basophils Absolute: 0.1 10*3/uL (ref 0.0–0.1)
Eosinophils Absolute: 0.2 10*3/uL (ref 0.0–0.5)
HCT: 39.5 % (ref 34.8–46.6)
HGB: 13.1 g/dL (ref 11.6–15.9)
LYMPH%: 27.5 % (ref 14.0–49.7)
MCV: 89.2 fL (ref 79.5–101.0)
MONO%: 8 % (ref 0.0–14.0)
NEUT#: 6 10*3/uL (ref 1.5–6.5)
NEUT%: 62.3 % (ref 38.4–76.8)
Platelets: 218 10*3/uL (ref 145–400)

## 2012-02-04 ENCOUNTER — Other Ambulatory Visit: Payer: Managed Care, Other (non HMO) | Admitting: Lab

## 2012-02-04 ENCOUNTER — Telehealth: Payer: Self-pay | Admitting: Oncology

## 2012-02-04 ENCOUNTER — Ambulatory Visit (HOSPITAL_BASED_OUTPATIENT_CLINIC_OR_DEPARTMENT_OTHER): Payer: Managed Care, Other (non HMO) | Admitting: Oncology

## 2012-02-04 VITALS — BP 132/75 | HR 87 | Temp 98.2°F | Ht 64.0 in | Wt 171.3 lb

## 2012-02-04 DIAGNOSIS — C50919 Malignant neoplasm of unspecified site of unspecified female breast: Secondary | ICD-10-CM

## 2012-02-04 DIAGNOSIS — C50419 Malignant neoplasm of upper-outer quadrant of unspecified female breast: Secondary | ICD-10-CM

## 2012-02-04 DIAGNOSIS — Z170 Estrogen receptor positive status [ER+]: Secondary | ICD-10-CM

## 2012-02-04 MED ORDER — TAMOXIFEN CITRATE 20 MG PO TABS: 20.0000 mg | ORAL_TABLET | Freq: Every day | ORAL | Status: DC

## 2012-02-04 NOTE — Progress Notes (Signed)
ID: AMOUR TRIGG   DOB: 1951-07-21  MR#: [id number]  JXB#:[id number]  HISTORY OF PRESENT ILLNESS: [name] has routine, regular mammograms which have been essentially unremarkable.  On the most recent mammogram, obtained May 24, 2003, at [facility], a possible mass was noted in the left breast.  She was referred to the [facility] for further evaluation and on May 30, 2003, she had spot compression views and an ultrasound.  The spot compression views confirmed the presence of an area of architectural distortion and on the sonogram there was a small appearing, 0.6-cm, area of ill- defined shadowing.  This was biopsied on the same day and showed ([id number]) an infiltrating lobular carcinoma.  It was ER positive at 26%,  progesterone receptor positive at 6%, HER-2 neu negative.  With this information she was referred to [facility] and after an appropriate discussion she proceeded to a left lumpectomy and sentinel lymph node dissection on June 09, 2003.  The pathology here ([id number]) showed a 4.2-cm infiltrating lobular carcinoma, with 0/2 lymph nodes involved.  An initially positive margin was cleared with an additional resection during the same procedure.     Her subsequent history is as detailed below  INTERVAL HISTORY: Dula returns today with [name] her husband for followup of her breast cancer. The interval history is significant for her having had at least 3 major asthma attacks since last September, the first one landing her in the hospital intensive care unit. Her medications have been changed and she is now seeing [facility] allergy management.  REVIEW OF SYSTEMS: She still a bit hoarse from her last attack, but currently is otherwise asymptomatic. She does have mild sinus problems for which she takes loratadine. She does a lot of walking at work, including multiple stair climbing daily, but does not otherwise exercise formally. She has some knee issues, mild hot flashes, but otherwise  A  review of systems is negative and she is tolerating tamoxifen without unusual side effects.  PAST MEDICAL HISTORY: Osteoporosis Asthma Goiter,  GERD Arrhythmias DDD LBBB  PAST SURGICAL HISTORY: s/p thyroidectomy S/p BTL S/p A&T  FAMILY HISTORY The patient's father died at the age of 66 from cirrhosis.  The patient's mother died at the age of 73 from clots in her colon.  There is no other family history of a clotting disorder.  The patient's brother has a history of prostate cancer.  GYNECOLOGIC HISTORY: She is G2, P3.  Last menstrual period in 1998.  She never took hormones in terms of postmenopausal estrogen replacement therapy.    SOCIAL HISTORY:  She works as an Brewing technologist.  She has been married for 13 years to [place] who is in Airline pilot.  She has three children from a prior marriage.  A  son who works as an Special educational needs teacher and  twins, one who is in Aeronautical engineer and the other works in a nursery.  They are all married.  She has three grandchildren.  Mr. Stober, himself, has two children from a prior marriage and one grandson.  This large extended family is all in the area.  The patient is a member of the [employer].    ADVANCED DIRECTIVES: in place  HEALTH MAINTENANCE: History  Substance Use Topics  . Smoking status: Not on file  . Smokeless tobacco: Not on file  . Alcohol Use: Not on file     Colonoscopy:  PAP:  Bone density:  Lipid panel:  Allergies  Allergen  Reactions  . Avelox (Moxifloxacin Hcl In Nacl) Hives  . Sulfonamide Derivatives     REACTION: rash    Current Outpatient Prescriptions  Medication Sig Dispense Refill  . beclomethasone (QVAR) 80 MCG/ACT inhaler Inhale 1 puff into the lungs as needed.      Marland Kitchen dexlansoprazole (DEXILANT) 60 MG capsule Take 60 mg by mouth daily.      . fluticasone (FLONASE) 50 MCG/ACT nasal spray Place 2 sprays into the nose daily.      Marland Kitchen levalbuterol (XOPENEX HFA) 45 MCG/ACT  inhaler Inhale 1-2 puffs into the lungs every 4 (four) hours as needed.      . ranitidine (ZANTAC) 300 MG tablet Take 300 mg by mouth at bedtime.      Marland Kitchen BENICAR 20 MG tablet       . montelukast (SINGULAIR) 10 MG tablet       . SYNTHROID 112 MCG tablet       . tamoxifen (NOLVADEX) 20 MG tablet         OBJECTIVE: Middle-aged white woman in no acute distress Filed Vitals:   02/04/12 1453  BP: 132/75  Pulse: 87  Temp: 98.2 F (36.8 C)     Body mass index is 29.40 kg/(m^2).    ECOG FS: 1  Sclerae unicteric Oropharynx clear No peripheral adenopathy Lungs no rales or rhonchi Heart regular rate and rhythm Abd benign MSK no focal spinal tenderness, no peripheral edema Neuro: nonfocal Breasts: right breast no suspicious masses; left breast status post lumpectomy, no evidence of local recurrence  LAB RESULTS: Lab Results  Component Value Date   WBC 9.6 01/28/2012   NEUTROABS 6.0 01/28/2012   HGB 13.1 01/28/2012   HCT 39.5 01/28/2012   MCV 89.2 01/28/2012   PLT 218 01/28/2012      Chemistry      Component Value Date/Time   NA 138 01/28/2012 1421   K 4.3 01/28/2012 1421   CL 104 01/28/2012 1421   CO2 25 01/28/2012 1421   BUN 18 01/28/2012 1421   CREATININE 0.59 01/28/2012 1421      Component Value Date/Time   CALCIUM 9.0 01/28/2012 1421   ALKPHOS 45 01/28/2012 1421   AST 15 01/28/2012 1421   ALT 14 01/28/2012 1421   BILITOT 0.3 01/28/2012 1421       Lab Results  Component Value Date   LABCA2 9 01/17/2011   LABCA2 9 01/17/2011   LABCA2 9 01/17/2011    No components found with this basename: [id number]    No results found for this basename: INR:1;PROTIME:1 in the last 168 hours  Urinalysis No results found for this basename: colorurine, appearanceur, labspec, phurine, glucoseu, hgbur, bilirubinur, ketonesur, proteinur, urobilinogen, nitrite, leukocytesur    STUDIES: Mammogram September 2012 unremarkable  ASSESSMENT:  This is a 61 year old [place] woman, status post left  lumpectomy and sentinel lymph node dissection August 2004 for a T2 N0, stage IIA invasive lobular carcinoma, grade 1,ER and PR positive, but HER2/neu negative, treated adjuvantly with weekly Taxol times twelve, then radiation, then status post five years of Arimidex completed in February 2010, at which point she switched to tamoxifen  PLAN: From my point of view she is doing very well and the plan is to continue tamoxifen for 2 more years of which will make it a total of 10 years on anti-estrogens. she knows to call for any problems that may develop before the next visit here.Effie Janoski C    02/04/2012

## 2012-02-04 NOTE — Telephone Encounter (Signed)
gve the pt her April 2014 appt calendar °

## 2012-06-05 ENCOUNTER — Other Ambulatory Visit (INDEPENDENT_AMBULATORY_CARE_PROVIDER_SITE_OTHER): Payer: Self-pay | Admitting: Surgery

## 2012-06-05 ENCOUNTER — Other Ambulatory Visit: Payer: Self-pay | Admitting: Oncology

## 2012-06-05 DIAGNOSIS — Z1231 Encounter for screening mammogram for malignant neoplasm of breast: Secondary | ICD-10-CM

## 2012-06-12 ENCOUNTER — Emergency Department (HOSPITAL_COMMUNITY)
Admission: EM | Admit: 2012-06-12 | Discharge: 2012-06-13 | Disposition: A | Discharge status: Home / Self Care | Payer: Managed Care, Other (non HMO) | Attending: Emergency Medicine | Admitting: Emergency Medicine

## 2012-06-12 ENCOUNTER — Emergency Department (HOSPITAL_COMMUNITY): Payer: Managed Care, Other (non HMO)

## 2012-06-12 ENCOUNTER — Encounter (HOSPITAL_COMMUNITY): Payer: Self-pay | Admitting: *Deleted

## 2012-06-12 DIAGNOSIS — K5792 Diverticulitis of intestine, part unspecified, without perforation or abscess without bleeding: Secondary | ICD-10-CM

## 2012-06-12 DIAGNOSIS — R109 Unspecified abdominal pain: Secondary | ICD-10-CM | POA: Insufficient documentation

## 2012-06-12 DIAGNOSIS — K5732 Diverticulitis of large intestine without perforation or abscess without bleeding: Secondary | ICD-10-CM | POA: Insufficient documentation

## 2012-06-12 DIAGNOSIS — R10819 Abdominal tenderness, unspecified site: Secondary | ICD-10-CM | POA: Insufficient documentation

## 2012-06-12 HISTORY — DX: Malignant neoplasm of unspecified site of unspecified female breast: C50.919

## 2012-06-12 HISTORY — DX: Disorder of thyroid, unspecified: E07.9

## 2012-06-12 LAB — COMPREHENSIVE METABOLIC PANEL
ALT: 16 U/L (ref 0–35)
AST: 13 U/L (ref 0–37)
Alkaline Phosphatase: 45 U/L (ref 39–117)
CO2: 25 mEq/L (ref 19–32)
GFR calc Af Amer: >90 mL/min (ref >90)
GFR calc non Af Amer: >90 mL/min (ref >90)
Glucose, Bld: 110 mg/dL — ABNORMAL HIGH (ref 70–99)
Potassium: 3.7 mEq/L (ref 3.5–5.1)
Sodium: 134 mEq/L — ABNORMAL LOW (ref 135–145)
Total Protein: 6.8 g/dL (ref 6.0–8.3)

## 2012-06-12 MED ORDER — ONDANSETRON HCL 4 MG/2ML IJ SOLN
4.0000 mg | Freq: Once | INTRAMUSCULAR | Status: AC
  Administered 2012-06-12: 4 mg via INTRAVENOUS
  Filled 2012-06-12: qty 2

## 2012-06-12 MED ORDER — ONDANSETRON HCL 4 MG PO TABS: 4.0000 mg | ORAL_TABLET | Freq: Once | ORAL | Status: DC

## 2012-06-12 MED ORDER — METRONIDAZOLE IN NACL 5-0.79 MG/ML-% IV SOLN
500.0000 mg | Freq: Once | INTRAVENOUS | Status: AC
  Administered 2012-06-12: 500 mg via INTRAVENOUS
  Filled 2012-06-12: qty 100

## 2012-06-12 MED ORDER — HYDROMORPHONE HCL PF 1 MG/ML IJ SOLN
1.0000 mg | Freq: Once | INTRAMUSCULAR | Status: AC
  Administered 2012-06-12: 1 mg via INTRAVENOUS
  Filled 2012-06-12: qty 1

## 2012-06-12 MED ORDER — SODIUM CHLORIDE 0.9 % IV SOLN
Freq: Once | INTRAVENOUS | Status: AC
  Administered 2012-06-12: 21:00:00 via INTRAVENOUS

## 2012-06-12 MED ORDER — IOHEXOL 300 MG/ML  SOLN
100.0000 mL | Freq: Once | INTRAMUSCULAR | Status: AC | PRN
  Administered 2012-06-12: 100 mL via INTRAVENOUS

## 2012-06-12 MED ORDER — AMOXICILLIN-POT CLAVULANATE 875-125 MG PO TABS
1.0000 | ORAL_TABLET | Freq: Once | ORAL | Status: AC
  Administered 2012-06-12: 1 via ORAL
  Filled 2012-06-12: qty 1

## 2012-06-12 NOTE — ED Notes (Signed)
Pt c/o lower severe abd pain x 2 days; vomiting; nausea; seen at [facility] PCP today and had a WBC count 19.7 told to come for CT scan; pain 10/10 at present

## 2012-06-12 NOTE — ED Provider Notes (Signed)
History     CSN: [id number]  Arrival date & time 06/12/12  1907   First MD Initiated Contact with Patient 06/12/12 2047      Chief Complaint  Patient presents with  . Abdominal Pain    (Consider location/radiation/quality/duration/timing/severity/associated sxs/prior treatment) HPI The patient presents with abdominal pain.  She notes her symptoms began yesterday, insidiously.  Since onset symptoms have been worse.  Pain is focally about the lower abdomen, sort/sharp, worse with motion or palpation.  The patient notes that initially she had several episodes of loose stool, then vomiting.  Since that time she's had anorexia, nausea, but no additional episodes of emesis.  She also endorses chills, denies fevers. The patient initially presented to her primary care Center, have labs drawn, with a leukocytosis of 20,000.  She was referred here for further evaluation. Past Medical History  Diagnosis Date  . Breast cancer   . Thyroid disease     Past Surgical History  Procedure Date  . Breast surgery   . Tonsillectomy   . Thyroidectomy   . Sterilization     No family history on file.  History  Substance Use Topics  . Smoking status: Never Smoker   . Smokeless tobacco: Not on file  . Alcohol Use: No    OB History    Grav Para Term Preterm Abortions TAB SAB Ect Mult Living                  Review of Systems  Constitutional:       HPI  HENT:       HPI otherwise negative  Eyes: Negative.   Respiratory:       HPI, otherwise negative  Cardiovascular:       HPI, otherwise nmegative  Gastrointestinal: Positive for vomiting.  Genitourinary:       HPI, otherwise negative  Musculoskeletal:       HPI, otherwise negative  Skin: Negative.   Neurological: Negative for syncope.    Allergies  Avelox and Sulfonamide derivatives  Home Medications   Current Outpatient Rx  Name Route Sig Dispense Refill  . ASPIRIN 81 MG PO TABS Oral Take 81 mg by mouth daily.    .  BECLOMETHASONE DIPROPIONATE 80 MCG/ACT IN AERS Inhalation Inhale 1 puff into the lungs as needed.    Marland Kitchen BENICAR 20 MG PO TABS Oral Take 20 mg by mouth daily.     . DEXLANSOPRAZOLE 60 MG PO CPDR Oral Take 60 mg by mouth daily.    . OMEGA-3 FATTY ACIDS 1000 MG PO CAPS Oral Take 2 g by mouth daily.    Marland Kitchen FLUTICASONE PROPIONATE 50 MCG/ACT NA SUSP Nasal Place 2 sprays into the nose daily.    Marland Kitchen LEVALBUTEROL TARTRATE 45 MCG/ACT IN AERO Inhalation Inhale 1-2 puffs into the lungs every 4 (four) hours as needed. Shortness of breath    . LORATADINE 10 MG PO TABS Oral Take 10 mg by mouth daily.    Marland Kitchen MONTELUKAST SODIUM 10 MG PO TABS Oral Take 10 mg by mouth at bedtime.     . ADULT MULTIVITAMIN W/MINERALS CH Oral Take 1 tablet by mouth daily.    Marland Kitchen RANITIDINE HCL 300 MG PO TABS Oral Take 300 mg by mouth at bedtime.    Marland Kitchen SYNTHROID 112 MCG PO TABS Oral Take 112 mcg by mouth daily.     Marland Kitchen TAMOXIFEN CITRATE 20 MG PO TABS Oral Take 1 tablet (20 mg total) by mouth daily. 90 tablet 12  BP 122/49  Pulse 103  Temp 98.9 F (37.2 C) (Oral)  Resp 20  Wt 170 lb (77.111 kg)  SpO2 95%  Physical Exam  Nursing note and vitals reviewed. Constitutional: She is oriented to person, place, and time. She appears well-developed and well-nourished. No distress.  HENT:  Head: Normocephalic and atraumatic.  Eyes: Conjunctivae and EOM are normal.  Cardiovascular: Normal rate and regular rhythm.   Pulmonary/Chest: Effort normal and breath sounds normal. No stridor. No respiratory distress.  Abdominal: She exhibits no distension. There is no hepatosplenomegaly. There is tenderness in the right lower quadrant, suprapubic area and left lower quadrant. There is guarding. There is no rigidity, no rebound and no CVA tenderness.  Musculoskeletal: She exhibits no edema.  Neurological: She is alert and oriented to person, place, and time. No cranial nerve deficit.  Skin: Skin is warm and dry.  Psychiatric: She has a normal mood and  affect.    ED Course  Procedures (including critical care time)  Labs Reviewed  COMPREHENSIVE METABOLIC PANEL - Abnormal; Notable for the following:    Sodium 134 (*)     Glucose, Bld 110 (*)     All other components within normal limits  LIPASE, BLOOD   No results found.   No diagnosis found.  11:57 PM Patient feeling better  MDM  The patient presents with new abdominal pain, bowel habit changes, chills.  On exam she is uncomfortable appearing, though in no distress.  Given the patient's description of abdominal there suspicion of appendicitis versus diverticulitis versus colitis.  Diverticulitis is demonstrated on her CAT scan, and given her leukocytosis, she's treated with fever from antibiotics and emergency department.  She has a gastroenterologist, with whom she will followup.  She was discharged in stable condition   Gerhard Munch, MD 06/13/12 0002

## 2012-06-13 MED ORDER — ONDANSETRON HCL 4 MG PO TABS: 4.0000 mg | ORAL_TABLET | Freq: Four times a day (QID) | ORAL | Status: AC | PRN

## 2012-06-13 MED ORDER — METRONIDAZOLE 500 MG PO TABS: 500.0000 mg | ORAL_TABLET | Freq: Two times a day (BID) | ORAL | Status: DC

## 2012-06-13 MED ORDER — HYDROCODONE-ACETAMINOPHEN 5-500 MG PO TABS: 1.0000 | ORAL_TABLET | Freq: Four times a day (QID) | ORAL | Status: DC | PRN

## 2012-06-13 MED ORDER — AMOXICILLIN-POT CLAVULANATE 875-125 MG PO TABS: 1.0000 | ORAL_TABLET | Freq: Two times a day (BID) | ORAL | Status: DC

## 2012-06-14 ENCOUNTER — Inpatient Hospital Stay (HOSPITAL_COMMUNITY)
Admission: EM | Admit: 2012-06-14 | Discharge: 2012-06-22 | DRG: 391 | Disposition: A | Discharge status: Home / Self Care | Payer: Managed Care, Other (non HMO) | Attending: Internal Medicine | Admitting: Internal Medicine

## 2012-06-14 ENCOUNTER — Encounter (HOSPITAL_COMMUNITY): Payer: Self-pay | Admitting: *Deleted

## 2012-06-14 ENCOUNTER — Emergency Department (HOSPITAL_COMMUNITY): Payer: Managed Care, Other (non HMO)

## 2012-06-14 DIAGNOSIS — J45909 Unspecified asthma, uncomplicated: Secondary | ICD-10-CM

## 2012-06-14 DIAGNOSIS — R109 Unspecified abdominal pain: Secondary | ICD-10-CM

## 2012-06-14 DIAGNOSIS — A0472 Enterocolitis due to Clostridium difficile, not specified as recurrent: Secondary | ICD-10-CM

## 2012-06-14 DIAGNOSIS — C50919 Malignant neoplasm of unspecified site of unspecified female breast: Secondary | ICD-10-CM

## 2012-06-14 DIAGNOSIS — N179 Acute kidney failure, unspecified: Secondary | ICD-10-CM

## 2012-06-14 DIAGNOSIS — J189 Pneumonia, unspecified organism: Secondary | ICD-10-CM

## 2012-06-14 DIAGNOSIS — K5792 Diverticulitis of intestine, part unspecified, without perforation or abscess without bleeding: Secondary | ICD-10-CM

## 2012-06-14 DIAGNOSIS — E039 Hypothyroidism, unspecified: Secondary | ICD-10-CM | POA: Diagnosis present

## 2012-06-14 DIAGNOSIS — J4489 Other specified chronic obstructive pulmonary disease: Secondary | ICD-10-CM | POA: Diagnosis present

## 2012-06-14 DIAGNOSIS — K5732 Diverticulitis of large intestine without perforation or abscess without bleeding: Principal | ICD-10-CM | POA: Diagnosis present

## 2012-06-14 DIAGNOSIS — J449 Chronic obstructive pulmonary disease, unspecified: Secondary | ICD-10-CM | POA: Diagnosis present

## 2012-06-14 DIAGNOSIS — E876 Hypokalemia: Secondary | ICD-10-CM | POA: Diagnosis present

## 2012-06-14 DIAGNOSIS — K529 Noninfective gastroenteritis and colitis, unspecified: Secondary | ICD-10-CM

## 2012-06-14 DIAGNOSIS — K219 Gastro-esophageal reflux disease without esophagitis: Secondary | ICD-10-CM

## 2012-06-14 HISTORY — DX: Gastro-esophageal reflux disease without esophagitis: K21.9

## 2012-06-14 HISTORY — DX: Pneumonia, unspecified organism: J18.9

## 2012-06-14 HISTORY — DX: Nonspecific intraventricular block: I45.4

## 2012-06-14 HISTORY — DX: Diverticulitis of intestine, part unspecified, without perforation or abscess without bleeding: K57.92

## 2012-06-14 HISTORY — DX: Unspecified asthma, uncomplicated: J45.909

## 2012-06-14 LAB — CBC WITH DIFFERENTIAL/PLATELET
Basophils Absolute: 0 10*3/uL (ref 0.0–0.1)
Basophils Relative: 0 % (ref 0–1)
Eosinophils Absolute: 0.2 10*3/uL (ref 0.0–0.7)
Eosinophils Relative: 2 % (ref 0–5)
HCT: 38.5 % (ref 36.0–46.0)
MCH: 29.3 pg (ref 26.0–34.0)
MCHC: 33.2 g/dL (ref 30.0–36.0)
MCV: 88.1 fL (ref 78.0–100.0)
Monocytes Absolute: 0.9 10*3/uL (ref 0.1–1.0)
RDW: 13.1 % (ref 11.5–15.5)

## 2012-06-14 LAB — DIFFERENTIAL
Basophils Absolute: 0 10*3/uL (ref 0.0–0.1)
Eosinophils Relative: 0 % (ref 0–5)
Lymphocytes Relative: 11 % — ABNORMAL LOW (ref 12–46)
Lymphs Abs: 1.9 10*3/uL (ref 0.7–4.0)
Monocytes Absolute: 0.9 10*3/uL (ref 0.1–1.0)

## 2012-06-14 LAB — URINALYSIS, ROUTINE W REFLEX MICROSCOPIC
Glucose, UA: NEGATIVE mg/dL
Hgb urine dipstick: NEGATIVE
Specific Gravity, Urine: 1.019 (ref 1.005–1.030)
Urobilinogen, UA: 0.2 mg/dL (ref 0.0–1.0)

## 2012-06-14 LAB — COMPREHENSIVE METABOLIC PANEL
ALT: 13 U/L (ref 0–35)
AST: 15 U/L (ref 0–37)
AST: 11 U/L (ref 0–37)
Albumin: 3.4 g/dL — ABNORMAL LOW (ref 3.5–5.2)
Alkaline Phosphatase: 38 U/L — ABNORMAL LOW (ref 39–117)
CO2: 27 mEq/L (ref 19–32)
Calcium: 8.8 mg/dL (ref 8.4–10.5)
Calcium: 8.4 mg/dL (ref 8.4–10.5)
Chloride: 103 mEq/L (ref 96–112)
Creatinine, Ser: 0.58 mg/dL (ref 0.50–1.10)
GFR calc non Af Amer: >90 mL/min (ref >90)
Potassium: 3.6 mEq/L (ref 3.5–5.1)
Sodium: 137 mEq/L (ref 135–145)

## 2012-06-14 LAB — CBC
HCT: 36.5 % (ref 36.0–46.0)
MCH: 29.6 pg (ref 26.0–34.0)
MCV: 87.1 fL (ref 78.0–100.0)
RDW: 13.1 % (ref 11.5–15.5)
WBC: 16.7 10*3/uL — ABNORMAL HIGH (ref 4.0–10.5)

## 2012-06-14 LAB — URINE MICROSCOPIC-ADD ON

## 2012-06-14 LAB — TROPONIN I: Troponin I: <0.3 ng/mL (ref <0.30)

## 2012-06-14 MED ORDER — VANCOMYCIN HCL IN DEXTROSE 1-5 GM/200ML-% IV SOLN
1000.0000 mg | Freq: Three times a day (TID) | INTRAVENOUS | Status: DC
  Filled 2012-06-14 (×2): qty 200

## 2012-06-14 MED ORDER — ASPIRIN EC 81 MG PO TBEC
81.0000 mg | DELAYED_RELEASE_TABLET | Freq: Every day | ORAL | Status: DC
  Administered 2012-06-16 – 2012-06-21 (×5): 81 mg via ORAL
  Filled 2012-06-14 (×8): qty 1

## 2012-06-14 MED ORDER — VANCOMYCIN HCL IN DEXTROSE 1-5 GM/200ML-% IV SOLN
1000.0000 mg | Freq: Once | INTRAVENOUS | Status: AC
  Administered 2012-06-14: 1000 mg via INTRAVENOUS
  Filled 2012-06-14: qty 200

## 2012-06-14 MED ORDER — SODIUM CHLORIDE 0.9 % IV SOLN
INTRAVENOUS | Status: DC
  Administered 2012-06-14: 22:00:00 via INTRAVENOUS

## 2012-06-14 MED ORDER — LEVOTHYROXINE SODIUM 112 MCG PO TABS
112.0000 ug | ORAL_TABLET | Freq: Every day | ORAL | Status: DC
  Administered 2012-06-15 – 2012-06-16 (×2): 112 ug via ORAL
  Filled 2012-06-14 (×6): qty 1

## 2012-06-14 MED ORDER — FENTANYL CITRATE 0.05 MG/ML IJ SOLN
50.0000 ug | INTRAMUSCULAR | Status: DC | PRN
  Administered 2012-06-14 (×3): 50 ug via INTRAVENOUS
  Filled 2012-06-14 (×3): qty 2

## 2012-06-14 MED ORDER — IRBESARTAN 150 MG PO TABS
150.0000 mg | ORAL_TABLET | Freq: Every day | ORAL | Status: DC
  Filled 2012-06-14 (×2): qty 1

## 2012-06-14 MED ORDER — MONTELUKAST SODIUM 10 MG PO TABS
10.0000 mg | ORAL_TABLET | Freq: Every day | ORAL | Status: DC
  Administered 2012-06-16 – 2012-06-21 (×5): 10 mg via ORAL
  Filled 2012-06-14 (×8): qty 1

## 2012-06-14 MED ORDER — VANCOMYCIN HCL IN DEXTROSE 1-5 GM/200ML-% IV SOLN
1000.0000 mg | Freq: Two times a day (BID) | INTRAVENOUS | Status: DC
  Administered 2012-06-15 – 2012-06-16 (×3): 1000 mg via INTRAVENOUS
  Filled 2012-06-14 (×2): qty 200

## 2012-06-14 MED ORDER — PIPERACILLIN-TAZOBACTAM 3.375 G IVPB: 3.3750 g | Freq: Once | INTRAVENOUS | Status: DC

## 2012-06-14 MED ORDER — IOHEXOL 300 MG/ML  SOLN
100.0000 mL | Freq: Once | INTRAMUSCULAR | Status: AC | PRN
  Administered 2012-06-14: 100 mL via INTRAVENOUS

## 2012-06-14 MED ORDER — PIPERACILLIN-TAZOBACTAM 3.375 G IVPB 30 MIN
3.3750 g | INTRAVENOUS | Status: AC
  Administered 2012-06-14: 3.375 g via INTRAVENOUS
  Filled 2012-06-14: qty 50

## 2012-06-14 MED ORDER — METRONIDAZOLE IN NACL 5-0.79 MG/ML-% IV SOLN
500.0000 mg | Freq: Three times a day (TID) | INTRAVENOUS | Status: DC
  Administered 2012-06-14 – 2012-06-15 (×2): 500 mg via INTRAVENOUS
  Filled 2012-06-14 (×3): qty 100

## 2012-06-14 MED ORDER — METRONIDAZOLE IN NACL 5-0.79 MG/ML-% IV SOLN: 500.0000 mg | Freq: Three times a day (TID) | INTRAVENOUS | Status: DC

## 2012-06-14 MED ORDER — TAMOXIFEN CITRATE 10 MG PO TABS
20.0000 mg | ORAL_TABLET | Freq: Every day | ORAL | Status: DC
  Administered 2012-06-16 – 2012-06-21 (×5): 20 mg via ORAL
  Filled 2012-06-14 (×10): qty 2

## 2012-06-14 MED ORDER — ADULT MULTIVITAMIN W/MINERALS CH
1.0000 | ORAL_TABLET | Freq: Every day | ORAL | Status: DC
  Administered 2012-06-16 – 2012-06-18 (×3): 1 via ORAL
  Filled 2012-06-14 (×8): qty 1

## 2012-06-14 MED ORDER — MORPHINE SULFATE 4 MG/ML IJ SOLN
4.0000 mg | Freq: Once | INTRAMUSCULAR | Status: AC
  Administered 2012-06-14: 4 mg via INTRAVENOUS
  Filled 2012-06-14: qty 1

## 2012-06-14 MED ORDER — MORPHINE SULFATE 4 MG/ML IJ SOLN
INTRAMUSCULAR | Status: AC
  Filled 2012-06-14: qty 1

## 2012-06-14 MED ORDER — ONDANSETRON HCL 4 MG/2ML IJ SOLN
4.0000 mg | Freq: Four times a day (QID) | INTRAMUSCULAR | Status: DC | PRN
  Administered 2012-06-14 – 2012-06-15 (×2): 4 mg via INTRAVENOUS

## 2012-06-14 MED ORDER — ONDANSETRON HCL 4 MG/2ML IJ SOLN
4.0000 mg | Freq: Three times a day (TID) | INTRAMUSCULAR | Status: AC | PRN
  Filled 2012-06-14 (×2): qty 2

## 2012-06-14 MED ORDER — SODIUM CHLORIDE 0.9 % IV SOLN: INTRAVENOUS | Status: DC

## 2012-06-14 MED ORDER — HYDROCODONE-ACETAMINOPHEN 5-325 MG PO TABS
1.0000 | ORAL_TABLET | ORAL | Status: DC | PRN
  Administered 2012-06-16 (×3): 2 via ORAL
  Filled 2012-06-14 (×3): qty 2
  Filled 2012-06-14: qty 1

## 2012-06-14 MED ORDER — FAMOTIDINE 20 MG PO TABS
20.0000 mg | ORAL_TABLET | Freq: Two times a day (BID) | ORAL | Status: DC
  Administered 2012-06-15: 20 mg via ORAL
  Filled 2012-06-14 (×5): qty 1

## 2012-06-14 MED ORDER — FLUTICASONE PROPIONATE 50 MCG/ACT NA SUSP
2.0000 | Freq: Every day | NASAL | Status: DC
  Administered 2012-06-17 – 2012-06-21 (×4): 2 via NASAL
  Filled 2012-06-14: qty 16

## 2012-06-14 MED ORDER — METRONIDAZOLE IN NACL 5-0.79 MG/ML-% IV SOLN
500.0000 mg | Freq: Once | INTRAVENOUS | Status: AC
  Administered 2012-06-14: 500 mg via INTRAVENOUS
  Filled 2012-06-14: qty 100

## 2012-06-14 MED ORDER — PIPERACILLIN-TAZOBACTAM 3.375 G IVPB
3.3750 g | Freq: Three times a day (TID) | INTRAVENOUS | Status: DC
  Administered 2012-06-15 – 2012-06-16 (×5): 3.375 g via INTRAVENOUS
  Filled 2012-06-14 (×8): qty 50

## 2012-06-14 MED ORDER — LORATADINE 10 MG PO TABS
10.0000 mg | ORAL_TABLET | Freq: Every day | ORAL | Status: DC
  Administered 2012-06-16 – 2012-06-21 (×5): 10 mg via ORAL
  Filled 2012-06-14 (×8): qty 1

## 2012-06-14 MED ORDER — ONDANSETRON HCL 4 MG/2ML IJ SOLN
4.0000 mg | Freq: Once | INTRAMUSCULAR | Status: AC
  Administered 2012-06-14: 4 mg via INTRAVENOUS
  Filled 2012-06-14: qty 2

## 2012-06-14 MED ORDER — PANTOPRAZOLE SODIUM 40 MG PO TBEC
40.0000 mg | DELAYED_RELEASE_TABLET | Freq: Every day | ORAL | Status: DC
  Administered 2012-06-15: 40 mg via ORAL
  Filled 2012-06-14 (×2): qty 1

## 2012-06-14 MED ORDER — FLUTICASONE PROPIONATE HFA 44 MCG/ACT IN AERO
2.0000 | INHALATION_SPRAY | Freq: Two times a day (BID) | RESPIRATORY_TRACT | Status: DC
  Filled 2012-06-14: qty 10.6

## 2012-06-14 MED ORDER — ONDANSETRON HCL 4 MG PO TABS: 4.0000 mg | ORAL_TABLET | Freq: Four times a day (QID) | ORAL | Status: DC | PRN

## 2012-06-14 MED ORDER — MORPHINE SULFATE 4 MG/ML IJ SOLN: 4.0000 mg | INTRAMUSCULAR | Status: DC | PRN

## 2012-06-14 MED ORDER — HYDROMORPHONE HCL PF 1 MG/ML IJ SOLN
1.0000 mg | INTRAMUSCULAR | Status: AC | PRN
  Administered 2012-06-14 – 2012-06-15 (×6): 1 mg via INTRAVENOUS
  Filled 2012-06-14 (×7): qty 1

## 2012-06-14 MED ORDER — SODIUM CHLORIDE 0.9 % IV BOLUS (SEPSIS)
500.0000 mL | Freq: Once | INTRAVENOUS | Status: AC
  Administered 2012-06-14: 500 mL via INTRAVENOUS

## 2012-06-14 MED ORDER — LEVALBUTEROL TARTRATE 45 MCG/ACT IN AERO
1.0000 | INHALATION_SPRAY | RESPIRATORY_TRACT | Status: DC | PRN
  Filled 2012-06-14: qty 15

## 2012-06-14 NOTE — Progress Notes (Addendum)
ANTIBIOTIC CONSULT NOTE - INITIAL  Pharmacy Consult for Vancomycin  Indication: suspected PNA  Allergies  Allergen Reactions  . Avelox (Moxifloxacin Hcl In Nacl) Hives  . Sulfonamide Derivatives     REACTION: rash    Patient Measurements:  Vital Signs: Temp: 98 F (36.7 C) (09/01 1605) Temp src: Oral (09/01 1605) BP: 126/44 mmHg (09/01 1605) Pulse Rate: 87  (09/01 1605) Intake/Output from previous day:   Intake/Output from this shift:    Labs:  Basename 06/14/12 1402 06/12/12 2020  WBC 13.0* --  HGB 12.8 --  PLT 242 --  LABCREA -- --  CREATININE 0.58 0.56   The CrCl is unknown because both a height and weight (above a minimum accepted value) are required for this calculation. No results found for this basename: VANCOTROUGH:2,VANCOPEAK:2,VANCORANDOM:2,GENTTROUGH:2,GENTPEAK:2,GENTRANDOM:2,TOBRATROUGH:2,TOBRAPEAK:2,TOBRARND:2,AMIKACINPEAK:2,AMIKACINTROU:2,AMIKACIN:2, in the last 72 hours   Microbiology: No results found for this or any previous visit (from the past 720 hour(s)).  Medical History: Past Medical History  Diagnosis Date  . Breast cancer   . Thyroid disease   . Asthma   . BBB (bundle branch block)     left  . GERD (gastroesophageal reflux disease)     Medications:  Scheduled:    . metronidazole  500 mg Intravenous Once  .  morphine injection  4 mg Intravenous Once  .  morphine injection  4 mg Intravenous Once  . ondansetron (ZOFRAN) IV  4 mg Intravenous Once  . piperacillin-tazobactam  3.375 g Intravenous To ER  . sodium chloride  500 mL Intravenous Once  . vancomycin  1,000 mg Intravenous Once  . vancomycin  1,000 mg Intravenous Q8H  . DISCONTD: piperacillin-tazobactam (ZOSYN)  IV  3.375 g Intravenous Once   Infusions:   PRN: fentaNYL, iohexol Assessment:  61 yo F with suspected HCAP on broad spectrum antibiotics with vancomycin and zosyn  WBC elevated (13.0), AF  Renal function stable and WNL, CrCL 75 ml/min  Blood cultures  sent   Goal of Therapy:  Vancomycin trough level 15-20 mcg/ml  Plan:  1.) Vancomycin 1 gram IV q12h. 2.) Continue Zosyn 3.375 gm IV q8h 3.) Monitor renal function 4.) F/u Cultures   Nilza Eaker, Loma Messing PharmD Pager #: [phone] 9:03 PM 06/14/2012

## 2012-06-14 NOTE — ED Notes (Signed)
Pt states she was seen in ED Friday past for LLQ pain which began Thursday.  Pt states she was dx with diverticulitis on Friday.  Pt states this morning after she ate breakfast she began having LLQ pain radiating in to LUQ again.  Pt has taken Vicodin with no relief.  Pt denies N/V/D and fever.

## 2012-06-14 NOTE — H&P (Addendum)
[facility] History and Physical  Allison Clark ZOX:[id number] DOB: 03/23/1951 DOA: 06/14/2012  Referring physician: ER physician PCP: Ezequiel Kayser, MD   Chief Complaint: abdominal pain  HPI:  61 year old female with history of GERD, hypothyroidism, asthma presented with left lower quadrant abdominal pain started today shortly after eating and has lasted about 45 minutes, continuously, 8/10 in intensity, non radiating and not relieved with home pain medications. Patient was seen few days prior for the same abdominal pain, was found to have diverticulitis and was prescribed antibiotics. Initially she reported she felt well but the pain came back suddenly today again with associated nausea but no vomiting. No fever or chills, no shortness of breath, no chest pain. NO lightheadedness and dizziness. No reports of blood in stool or urine.  Principal Problem:  *Diverticulitis - will continue zosyn (allergy to cipro) and flagyl for now - pain control with percocet and morphine PRN - zofran prn nausea - continue IV fludis, NS @ 75 cc/hr - keep NPO  Active Problems: Pneumonia - continue vanco and zosyn  - follow up labs associated with pneumonia order set - patient was just recently in ED, possible HCAP - mild leukocytosis on labs   ASTHMA - continue home meds   GERD - sever, continue protonix and pepcid  Breast cancer - continue tamoxifen  Code Status: Full Family Communication: Pt at bedside Disposition Plan: Admit for further evaluation  Manson Passey, MD  [facility] Pager [phone]  If 7PM-7AM, please contact night-coverage [email or web address] Password [email or web address] 06/14/2012, 8:37 PM  Review of Systems:   Constitutional: Negative for fever, chills and malaise/fatigue. Negative for diaphoresis.  HENT: Negative for hearing loss, ear pain, nosebleeds, congestion, sore throat, neck pain, tinnitus and ear discharge.   Eyes: Negative for blurred vision, double  vision, photophobia, pain, discharge and redness.  Respiratory: Negative for cough, hemoptysis, sputum production, shortness of breath, wheezing and stridor.   Cardiovascular: Negative for chest pain, palpitations, orthopnea, claudication and leg swelling.  Gastrointestinal: per HPI Genitourinary: Negative for dysuria, urgency, frequency, hematuria and flank pain.  Musculoskeletal: Negative for myalgias, back pain, joint pain and falls.  Skin: Negative for itching and rash.  Neurological: Negative for dizziness and weakness. Negative for tingling, tremors, sensory change, speech change, focal weakness, loss of consciousness and headaches.  Endo/Heme/Allergies: Negative for environmental allergies and polydipsia. Does not bruise/bleed easily.  Psychiatric/Behavioral: Negative for suicidal ideas. The patient is not nervous/anxious.      Past Medical History  Diagnosis Date  . Breast cancer   . Thyroid disease   . Asthma   . BBB (bundle branch block)     left  . GERD (gastroesophageal reflux disease)    Past Surgical History  Procedure Date  . Breast surgery   . Tonsillectomy   . Thyroidectomy   . Sterilization   . Tubal ligation    Social History:  reports that she has never smoked. She does not have any smokeless tobacco history on file. She reports that she does not drink alcohol. Her drug history not on file.  Allergies  Allergen Reactions  . Avelox (Moxifloxacin Hcl In Nacl) Hives  . Sulfonamide Derivatives     REACTION: rash    Family History: htn in mother  Prior to Admission medications   Medication Sig Start Date End Date Taking? Authorizing Provider  amoxicillin-clavulanate (AUGMENTIN) 875-125 MG per tablet Take 1 tablet by mouth 2 (two) times daily. 06/13/12 06/23/12 Yes Gerhard Munch, MD  aspirin 81  MG tablet Take 81 mg by mouth daily.   Yes Historical Provider, MD  beclomethasone (QVAR) 80 MCG/ACT inhaler Inhale 2 puffs into the lungs 2 (two) times daily.    Yes  Historical Provider, MD  BENICAR 20 MG tablet Take 20 mg by mouth daily.  01/20/12  Yes Historical Provider, MD  dexlansoprazole (DEXILANT) 60 MG capsule Take 60 mg by mouth daily.   Yes Historical Provider, MD  fish oil-omega-3 fatty acids 1000 MG capsule Take 2 g by mouth daily.   Yes Historical Provider, MD  fluticasone (FLONASE) 50 MCG/ACT nasal spray Place 2 sprays into the nose daily.   Yes Historical Provider, MD  HYDROcodone-acetaminophen (VICODIN) 5-500 MG per tablet Take 1 tablet by mouth every 6 (six) hours as needed for pain. 06/13/12 06/23/12 Yes Gerhard Munch, MD  levalbuterol [facility] HFA) 45 MCG/ACT inhaler Inhale 1-2 puffs into the lungs every 4 (four) hours as needed. Shortness of breath   Yes Historical Provider, MD  levothyroxine (SYNTHROID, LEVOTHROID) 112 MCG tablet Take 112 mcg by mouth daily.   Yes Historical Provider, MD  loratadine (CLARITIN) 10 MG tablet Take 10 mg by mouth daily.   Yes Historical Provider, MD  metroNIDAZOLE (FLAGYL) 500 MG tablet Take 1 tablet (500 mg total) by mouth 2 (two) times daily. 06/13/12 06/23/12 Yes Gerhard Munch, MD  montelukast (SINGULAIR) 10 MG tablet Take 10 mg by mouth daily.  01/20/12  Yes Historical Provider, MD  Multiple Vitamin (MULTIVITAMIN WITH MINERALS) TABS Take 1 tablet by mouth daily.   Yes Historical Provider, MD  ondansetron (ZOFRAN) 4 MG tablet Take 1 tablet (4 mg total) by mouth every 6 (six) hours as needed for nausea. 06/13/12 06/20/12 Yes Gerhard Munch, MD  ranitidine (ZANTAC) 300 MG tablet Take 300 mg by mouth at bedtime.   Yes Historical Provider, MD  tamoxifen (NOLVADEX) 20 MG tablet Take 1 tablet (20 mg total) by mouth daily. 02/04/12  Yes Lowella Dell, MD   Physical Exam: Filed Vitals:   06/14/12 1257 06/14/12 1605  BP: 128/53 126/44  Pulse: 77 87  Temp: 98.3 F (36.8 C) 98 F (36.7 C)  TempSrc:  Oral  Resp: 22 20  SpO2: 97% 95%    Physical Exam  Constitutional: Appears well-developed and well-nourished. No  distress.  HENT: Normocephalic. External right and left ear normal. Oropharynx is clear and moist.  Eyes: Conjunctivae and EOM are normal. PERRLA, no scleral icterus.  Neck: Normal ROM. Neck supple. No JVD. No tracheal deviation. No thyromegaly.  CVS: RRR, S1/S2 +, no murmurs, no gallops, no carotid bruit.  Pulmonary: Effort and breath sounds normal, no stridor, rhonchi, wheezes, rales.  Abdominal: (+) BS< left lower and mid abdominal tenderness with no rebound tenderness, (+) guarding Musculoskeletal: Normal range of motion. No edema and no tenderness.  Lymphadenopathy: No lymphadenopathy noted, cervical, inguinal. Neuro: Alert. Normal reflexes, muscle tone coordination. No cranial nerve deficit. Skin: Skin is warm and dry. No rash noted. Not diaphoretic. No erythema. No pallor.  Psychiatric: Normal mood and affect. Behavior, judgment, thought content normal.   Labs on Admission:  Basic Metabolic Panel:  Lab 06/14/12 4098 06/12/12 2020  NA 136 134*  K 3.5 3.7  CL 101 98  CO2 25 25  GLUCOSE 97 110*  BUN 15 17  CREATININE 0.58 0.56  CALCIUM 8.8 9.2  MG -- --  PHOS -- --   Liver Function Tests:  Lab 06/14/12 1402 06/12/12 2020  AST 15 13  ALT 14 16  ALKPHOS  43 45  BILITOT 0.2* 0.4  PROT 6.3 6.8  ALBUMIN 3.4* 3.8    Lab 06/12/12 2020  LIPASE 27  AMYLASE --   No results found for this basename: AMMONIA:5 in the last 168 hours CBC:  Lab 06/14/12 1402  WBC 13.0*  NEUTROABS 9.4*  HGB 12.8  HCT 38.5  MCV 88.1  PLT 242   Cardiac Enzymes:  Lab 06/14/12 1403  CKTOTAL --  CKMB --  CKMBINDEX --  TROPONINI <0.30   BNP: No components found with this basename: POCBNP:5 CBG: No results found for this basename: GLUCAP:5 in the last 168 hours  Radiological Exams on Admission: Ct Abdomen Pelvis W Contrast  06/14/2012  *RADIOLOGY REPORT*  Clinical Data: Progressive left abdominal pain.  History of diverticulitis.  CT ABDOMEN AND PELVIS WITH CONTRAST  Technique:   Multidetector CT imaging of the abdomen and pelvis was performed following the standard protocol during bolus administration of intravenous contrast.  Contrast: OMNIPAQUE IOHEXOL 300 MG/ML  SOLN  Comparison: Acute abdominal series same date.  Abdominal pelvic CT 06/12/2012.  Findings: There is mildly increased atelectasis at both lung bases. No significant pleural effusion is demonstrated.  Multiple hepatic cysts are again noted.  There is a stable cyst posteriorly in the mid left kidney.  The right kidney appears normal.  The spleen, gallbladder, pancreas and adrenal glands appear normal.  Small splenic artery aneurysms are unchanged. There is no evidence of large vessel occlusion.  Distal transverse colon wall thickening and surrounding inflammatory change are stable.  There is no evidence of bowel obstruction, perforation or extraluminal fluid collection.  The small bowel and appendix appear normal.  The uterus, ovaries and bladder appear normal.  There are no acute osseous findings.  IMPRESSION:  1.  Stable probable inflammatory process involving the distal transverse colon and splenic flexure since CT performed 2 days ago. Again, findings most likely represent diverticulitis, although focal ischemic colitis and underlying mucosal lesion cannot be excluded. 2.  No evidence of perforation or obstruction.  No evidence of large vessel occlusion. 3.  Stable hepatic and left renal cysts.   Original Report Authenticated By: Gerrianne Scale, M.D.    Ct Abdomen Pelvis W Contrast  06/12/2012  *RADIOLOGY REPORT*  Clinical Data: Lower abdominal pain, nausea, vomiting and elevated white blood cell count.  CT ABDOMEN AND PELVIS WITH CONTRAST  Technique:  Multidetector CT imaging of the abdomen and pelvis was performed following the standard protocol during bolus administration of intravenous contrast.  Contrast: OMNIPAQUE IOHEXOL 300 MG/ML  SOLN  Comparison: None.  Findings: At the level of the distal  transverse colon and splenic flexure, focal inflammation and dilatation of the colon is present with a stool ball present measuring approximately 5 cm.  At this level, inflammatory changes are present surrounding the colon and findings most likely reflect acute diverticulitis.  The colon is also thickened at this level and it would be difficult to exclude underlying tumor or stricture by CT.  The colon distal to this level is decompressed.  No evidence of enlarged lymph nodes or free intraperitoneal air. No focal abscess or significant free fluid is identified.  Small bowel loops are normal in caliber.  The liver shows multiple cysts which all appear benign.  The largest in the left lobe measures approximately 4.1 cm and in the right lobe the largest measures 3.6 cm.  There is no evidence of biliary ductal dilatation.  The gallbladder is unremarkable.  The pancreas, spleen, adrenal  glands and kidneys are unremarkable. There are focal partially calcified aneurysms involving the distal branches of the splenic artery near the splenic hilum.  The larger measures approximately 8 mm in diameter and a smaller aneurysm measures approximately 4 mm in diameter.  No evidence of rupture.  The bladder is unremarkable.  Uterus and adnexal regions are unremarkable by CT.  No hernias are identified.  IMPRESSION:  1.  Inflammation of the distal transverse colon and splenic flexure at the level of focal stool accumulation.  The findings likely relate to acute diverticulitis.  However, underlying lesion or stricture of the colon cannot be excluded.  There is no evidence of extraluminal air to suggest overt colonic perforation. 2.  Hepatic cysts which appear benign. 3.  Incidental small splenic artery aneurysms with the largest measuring 8 mm and a smaller aneurysm measuring 4 mm.   Original Report Authenticated By: Reola Calkins, M.D.    Dg Abd Acute W/chest  06/14/2012  *RADIOLOGY REPORT*  Clinical Data: Left lower quadrant  abdominal pain, nausea and vomiting.  ACUTE ABDOMEN SERIES (ABDOMEN 2 VIEW & CHEST 1 VIEW)  Comparison: Chest dated 06/22/2011 and abdomen and pelvis CT dated 06/12/2012.  Findings: The cardiac silhouette is borderline enlarged.  Interval linear density at the right lung base and patchy density at the left lung base.  Possible small left pleural effusion.  Normal bowel gas pattern without free peritoneal air.  Diffuse osteopenia.  IMPRESSION:  1.  Patchy opacity at the left lung base concerning for pneumonia. 2.  Minimal right basilar atelectasis. 3.  No acute abdominal abnormality.   Original Report Authenticated By: Darrol Angel, M.D.     EKG: Normal sinus rhythm, no ST/T wave changes

## 2012-06-14 NOTE — ED Notes (Signed)
Per Robynn Pane: Patient given female urinal by Robynn Pane. Patient unable to void. Offered patient help to the bathroom using stedy. Patient refused, requested I/O cath. RN aware

## 2012-06-14 NOTE — ED Provider Notes (Cosign Needed)
Patient was seen today for ago for abdominal pain and was diagnosed with diverticulitis. She was sent home with medications and was doing better yesterday. However this morning she felt like she daily and she ate scrambled eggs, biscuit, and taken with orange juice and about 45 minutes later started having her diffuse abdominal pain again. Patient was left to me at the change of shift to get her repeat CT scan done. Which basically is unchanged from 2 days ago showing of colitis or diverticulitis. Patient reports that she is having a lot of pain and doesn't feel like she can go home. Her chest x-ray did show possible new pneumonia.  Discussed results of CT tonight with patient and husband, she is agreeable to being admitted.   PCP Dr Waynard Edwards  20:28 Dr Elisabeth Pigeon admit to med-surg team 8   Ct Abdomen Pelvis W Contrast  06/14/2012  *RADIOLOGY REPORT*  Clinical Data: Progressive left abdominal pain.  History of diverticulitis.  CT ABDOMEN AND PELVIS WITH CONTRAST  Technique:  Multidetector CT imaging of the abdomen and pelvis was performed following the standard protocol during bolus administration of intravenous contrast.  Contrast: OMNIPAQUE IOHEXOL 300 MG/ML  SOLN  Comparison: Acute abdominal series same date.  Abdominal pelvic CT 06/12/2012.  Findings: There is mildly increased atelectasis at both lung bases. No significant pleural effusion is demonstrated.  Multiple hepatic cysts are again noted.  There is a stable cyst posteriorly in the mid left kidney.  The right kidney appears normal.  The spleen, gallbladder, pancreas and adrenal glands appear normal.  Small splenic artery aneurysms are unchanged. There is no evidence of large vessel occlusion.  Distal transverse colon wall thickening and surrounding inflammatory change are stable.  There is no evidence of bowel obstruction, perforation or extraluminal fluid collection.  The small bowel and appendix appear normal.  The uterus, ovaries and bladder  appear normal.  There are no acute osseous findings.  IMPRESSION:  1.  Stable probable inflammatory process involving the distal transverse colon and splenic flexure since CT performed 2 days ago. Again, findings most likely represent diverticulitis, although focal ischemic colitis and underlying mucosal lesion cannot be excluded. 2.  No evidence of perforation or obstruction.  No evidence of large vessel occlusion. 3.  Stable hepatic and left renal cysts.   Original Report Authenticated By: Gerrianne Scale, M.D.    Ct Abdomen Pelvis W Contrast  06/12/2012  *RADIOLOGY REPORT*  Clinical Data: Lower abdominal pain, nausea, vomiting and elevated white blood cell count.  CT ABDOMEN AND PELVIS WITH CONTRAST  Technique:  Multidetector CT imaging of the abdomen and pelvis was performed following the standard protocol during bolus administration of intravenous contrast.  Contrast: OMNIPAQUE IOHEXOL 300 MG/ML  SOLN  Comparison: None.  Findings: At the level of the distal transverse colon and splenic flexure, focal inflammation and dilatation of the colon is present with a stool ball present measuring approximately 5 cm.  At this level, inflammatory changes are present surrounding the colon and findings most likely reflect acute diverticulitis.  The colon is also thickened at this level and it would be difficult to exclude underlying tumor or stricture by CT.  The colon distal to this level is decompressed.  No evidence of enlarged lymph nodes or free intraperitoneal air. No focal abscess or significant free fluid is identified.  Small bowel loops are normal in caliber.  The liver shows multiple cysts which all appear benign.  The largest in the left lobe measures  approximately 4.1 cm and in the right lobe the largest measures 3.6 cm.  There is no evidence of biliary ductal dilatation.  The gallbladder is unremarkable.  The pancreas, spleen, adrenal glands and kidneys are unremarkable. There are focal partially  calcified aneurysms involving the distal branches of the splenic artery near the splenic hilum.  The larger measures approximately 8 mm in diameter and a smaller aneurysm measures approximately 4 mm in diameter.  No evidence of rupture.  The bladder is unremarkable.  Uterus and adnexal regions are unremarkable by CT.  No hernias are identified.  IMPRESSION:  1.  Inflammation of the distal transverse colon and splenic flexure at the level of focal stool accumulation.  The findings likely relate to acute diverticulitis.  However, underlying lesion or stricture of the colon cannot be excluded.  There is no evidence of extraluminal air to suggest overt colonic perforation. 2.  Hepatic cysts which appear benign. 3.  Incidental small splenic artery aneurysms with the largest measuring 8 mm and a smaller aneurysm measuring 4 mm.   Original Report Authenticated By: Reola Calkins, M.D.    Dg Abd Acute W/chest  06/14/2012  *RADIOLOGY REPORT*  Clinical Data: Left lower quadrant abdominal pain, nausea and vomiting.  ACUTE ABDOMEN SERIES (ABDOMEN 2 VIEW & CHEST 1 VIEW)  Comparison: Chest dated 06/22/2011 and abdomen and pelvis CT dated 06/12/2012.  Findings: The cardiac silhouette is borderline enlarged.  Interval linear density at the right lung base and patchy density at the left lung base.  Possible small left pleural effusion.  Normal bowel gas pattern without free peritoneal air.  Diffuse osteopenia.  IMPRESSION:  1.  Patchy opacity at the left lung base concerning for pneumonia. 2.  Minimal right basilar atelectasis. 3.  No acute abdominal abnormality.   Original Report Authenticated By: Darrol Angel, M.D.     Ward Givens, MD 06/14/12 2029

## 2012-06-14 NOTE — ED Notes (Addendum)
Patient c/o of itching and burning at IV site approximately 20 minutes after last dose of Morphine . Arm is red IV flushed and  tubing changed  states arm feels better. Will notified MD

## 2012-06-14 NOTE — ED Notes (Signed)
Pt was unsuccessful at voiding for UA. Will try again in 20 min.

## 2012-06-14 NOTE — ED Provider Notes (Signed)
History     CSN: [id number]  Arrival date & time 06/14/12  1240   First MD Initiated Contact with Patient 06/14/12 1351      Chief Complaint  Patient presents with  . Abdominal Pain    (Consider location/radiation/quality/duration/timing/severity/associated sxs/prior treatment) HPI Pt seen Friday evening and diagnosed with diverticulitis. Pt was feeling better until today when she ate solid and developed acutely worsening LUQ/LLQ pain with chills. No N/V/D. Patient has not had BM or flatus today. Took oral pain meds without relief. Pt states she has asthma and that it is baseline. No home O2, no cough, fever, chest pain.  Past Medical History  Diagnosis Date  . Breast cancer   . Thyroid disease   . Asthma   . BBB (bundle branch block)     left  . GERD (gastroesophageal reflux disease)     Past Surgical History  Procedure Date  . Breast surgery   . Tonsillectomy   . Thyroidectomy   . Sterilization   . Tubal ligation     No family history on file.  History  Substance Use Topics  . Smoking status: Never Smoker   . Smokeless tobacco: Not on file  . Alcohol Use: No    OB History    Grav Para Term Preterm Abortions TAB SAB Ect Mult Living                  Review of Systems  Constitutional: Positive for chills, diaphoresis and fatigue. Negative for fever.  Respiratory: Negative for shortness of breath and wheezing.   Cardiovascular: Negative for chest pain, palpitations and leg swelling.  Gastrointestinal: Positive for abdominal pain and constipation. Negative for nausea, vomiting and diarrhea.  Skin: Negative for rash.  Neurological: Negative for dizziness, light-headedness, numbness and headaches.    Allergies  Avelox and Sulfonamide derivatives  Home Medications   No current outpatient prescriptions on file.  BP 147/61  Pulse 96  Temp 99.3 F (37.4 C) (Oral)  Resp 18  Ht 5' 4.17" (1.63 m)  Wt 169 lb 15.6 oz (77.1 kg)  BMI 29.02 kg/m2  SpO2  91%  Physical Exam  Nursing note and vitals reviewed. Constitutional: She is oriented to person, place, and time. She appears well-developed and well-nourished. She appears distressed.  HENT:  Head: Normocephalic and atraumatic.  Mouth/Throat: Oropharynx is clear and moist.  Eyes: EOM are normal. Pupils are equal, round, and reactive to light.  Neck: Normal range of motion. Neck supple.  Cardiovascular: Normal rate and regular rhythm.   Pulmonary/Chest: Effort normal and breath sounds normal. No respiratory distress. She has no wheezes. She has no rales. She exhibits no tenderness.  Abdominal: Soft. Bowel sounds are normal. She exhibits no mass. There is tenderness (LLQ/LUQ/epigastrum. mild guarding. ). There is guarding.  Musculoskeletal: Normal range of motion. She exhibits no edema and no tenderness.  Neurological: She is alert and oriented to person, place, and time.       Moves all ext without deificit  Skin: Skin is warm and dry. No rash noted. No erythema.  Psychiatric: She has a normal mood and affect. Her behavior is normal.    ED Course  Procedures (including critical care time)  Labs Reviewed  CBC WITH DIFFERENTIAL - Abnormal; Notable for the following:    WBC 13.0 (*)     Neutro Abs 9.4 (*)     All other components within normal limits  COMPREHENSIVE METABOLIC PANEL - Abnormal; Notable for the following:  Albumin 3.4 (*)     Total Bilirubin 0.2 (*)     All other components within normal limits  URINALYSIS, ROUTINE W REFLEX MICROSCOPIC - Abnormal; Notable for the following:    Ketones, ur 15 (*)     Leukocytes, UA SMALL (*)     All other components within normal limits  COMPREHENSIVE METABOLIC PANEL - Abnormal; Notable for the following:    Glucose, Bld 117 (*)     Total Protein 5.9 (*)     Albumin 3.1 (*)     Alkaline Phosphatase 38 (*)     All other components within normal limits  CBC - Abnormal; Notable for the following:    WBC 16.7 (*)     All other  components within normal limits  DIFFERENTIAL - Abnormal; Notable for the following:    Neutrophils Relative 83 (*)     Neutro Abs 13.9 (*)     Lymphocytes Relative 11 (*)     All other components within normal limits  TSH - Abnormal; Notable for the following:    TSH 18.436 (*)     All other components within normal limits  COMPREHENSIVE METABOLIC PANEL - Abnormal; Notable for the following:    Glucose, Bld 105 (*)     Calcium 8.0 (*)     Total Protein 5.6 (*)     Albumin 2.9 (*)     Alkaline Phosphatase 37 (*)     All other components within normal limits  CBC - Abnormal; Notable for the following:    WBC 13.2 (*)     HCT 35.4 (*)     All other components within normal limits  GLUCOSE, CAPILLARY - Abnormal; Notable for the following:    Glucose-Capillary 100 (*)     All other components within normal limits  CBC - Abnormal; Notable for the following:    RBC 3.85 (*)     Hemoglobin 11.2 (*)     HCT 34.0 (*)     All other components within normal limits  BASIC METABOLIC PANEL - Abnormal; Notable for the following:    Potassium 3.1 (*)     Glucose, Bld 103 (*)     Calcium 7.9 (*)     All other components within normal limits  BASIC METABOLIC PANEL - Abnormal; Notable for the following:    Creatinine, Ser 1.51 (*)     Calcium 8.3 (*)     GFR calc non Af Amer 36 (*)     GFR calc Af Amer 42 (*)     All other components within normal limits  CBC - Abnormal; Notable for the following:    WBC 14.3 (*)     Hemoglobin 11.6 (*)     HCT 34.1 (*)     All other components within normal limits  TROPONIN I  URINE MICROSCOPIC-ADD ON  CULTURE, BLOOD (ROUTINE X 2)  CULTURE, BLOOD (ROUTINE X 2)  HIV ANTIBODY (ROUTINE TESTING)  LEGIONELLA ANTIGEN, URINE  STREP PNEUMONIAE URINARY ANTIGEN  MAGNESIUM  PHOSPHORUS  APTT  PROTIME-INR  GRAM STAIN   Dg Chest 2 View  06/16/2012  *RADIOLOGY REPORT*  Clinical Data: Follow up pneumonia.  No cough for congestion. History breast cancer.  CHEST  - 2 VIEW  Comparison: 06/22/2011.  Findings: Left lower lobe opacity is present.  This is in the retrocardiac region, better seen on the lateral view.  Increased density over the lower thoracic spine.  The cardiopericardial silhouette appears within normal limits.  Stable  prominence of the ascending aorta on the lateral view.  Basilar atelectasis present on the right.  Small pleural effusion on the left.  Follow-up to ensure radiographic clearing recommended.  Clearing is usually observed at 8 weeks.  IMPRESSION: Left lower lobe opacity.  On prior abdominal CT, this area was visualized and represents an area of atelectasis and / or scarring. Superimposed pneumonia is difficult to exclude but based on the prior CT, pneumonia is unlikely.  Follow-up to ensure resolution is recommended.   Original Report Authenticated By: Andreas Newport, M.D.      1. Abdominal pain   2. Colitis   3. BREAST CANCER   4. Diverticulitis   5. Esophageal reflux   6. Healthcare-associated pneumonia   7. Unspecified asthma      Date: 06/14/2012  Rate: 72  Rhythm: normal sinus rhythm  QRS Axis: normal  Intervals: normal  ST/T Wave abnormalities: ST depressions inferiorly and ST depressions laterally  Conduction Disutrbances:none  Narrative Interpretation:   Old EKG Reviewed: changes noted ST depression in lateral leads 9/812 not as pronounced as today. Inferior abnormality noted previously.    MDM          Loren Racer, MD 06/17/12 [phone]

## 2012-06-15 DIAGNOSIS — J45909 Unspecified asthma, uncomplicated: Secondary | ICD-10-CM

## 2012-06-15 DIAGNOSIS — J189 Pneumonia, unspecified organism: Secondary | ICD-10-CM

## 2012-06-15 LAB — COMPREHENSIVE METABOLIC PANEL
ALT: 11 U/L (ref 0–35)
AST: 10 U/L (ref 0–37)
CO2: 27 mEq/L (ref 19–32)
Chloride: 104 mEq/L (ref 96–112)
GFR calc non Af Amer: >90 mL/min (ref >90)
Sodium: 138 mEq/L (ref 135–145)
Total Bilirubin: 0.3 mg/dL (ref 0.3–1.2)

## 2012-06-15 LAB — CBC
MCH: 29.9 pg (ref 26.0–34.0)
MCHC: 33.9 g/dL (ref 30.0–36.0)
Platelets: 209 10*3/uL (ref 150–400)

## 2012-06-15 LAB — TSH: TSH: 18.436 u[IU]/mL — ABNORMAL HIGH (ref 0.350–4.500)

## 2012-06-15 LAB — GLUCOSE, CAPILLARY: Glucose-Capillary: 100 mg/dL — ABNORMAL HIGH (ref 70–99)

## 2012-06-15 LAB — STREP PNEUMONIAE URINARY ANTIGEN: Strep Pneumo Urinary Antigen: NEGATIVE

## 2012-06-15 MED ORDER — FLUTICASONE PROPIONATE HFA 44 MCG/ACT IN AERO
1.0000 | INHALATION_SPRAY | Freq: Two times a day (BID) | RESPIRATORY_TRACT | Status: DC
  Filled 2012-06-15: qty 10.6

## 2012-06-15 MED ORDER — PROMETHAZINE HCL 25 MG/ML IJ SOLN
12.5000 mg | Freq: Four times a day (QID) | INTRAMUSCULAR | Status: DC | PRN
  Administered 2012-06-15 – 2012-06-16 (×2): 12.5 mg via INTRAVENOUS
  Administered 2012-06-17: 25 mg via INTRAVENOUS
  Administered 2012-06-18 – 2012-06-20 (×9): 12.5 mg via INTRAVENOUS
  Filled 2012-06-15 (×13): qty 1

## 2012-06-15 MED ORDER — SENNOSIDES-DOCUSATE SODIUM 8.6-50 MG PO TABS
1.0000 | ORAL_TABLET | Freq: Two times a day (BID) | ORAL | Status: DC
  Administered 2012-06-15 – 2012-06-18 (×6): 1 via ORAL
  Filled 2012-06-15 (×13): qty 1

## 2012-06-15 MED ORDER — DEXTROSE-NACL 5-0.45 % IV SOLN
INTRAVENOUS | Status: DC
  Administered 2012-06-15 (×2): via INTRAVENOUS

## 2012-06-15 MED ORDER — POLYETHYLENE GLYCOL 3350 17 G PO PACK
17.0000 g | PACK | Freq: Every day | ORAL | Status: DC
  Administered 2012-06-16 – 2012-06-18 (×3): 17 g via ORAL
  Filled 2012-06-15 (×6): qty 1

## 2012-06-15 MED ORDER — BECLOMETHASONE DIPROPIONATE 80 MCG/ACT IN AERS: 2.0000 | INHALATION_SPRAY | Freq: Two times a day (BID) | RESPIRATORY_TRACT | Status: DC

## 2012-06-15 MED ORDER — HYDROMORPHONE HCL PF 1 MG/ML IJ SOLN
1.0000 mg | INTRAMUSCULAR | Status: DC | PRN
  Administered 2012-06-15 – 2012-06-22 (×22): 1 mg via INTRAVENOUS
  Filled 2012-06-15 (×23): qty 1

## 2012-06-15 NOTE — Evaluation (Signed)
Physical Therapy Evaluation Patient Details Name: Allison Clark MRN: [id number] DOB: 05/07/1951 Today's Date: 06/15/2012 Time: 1340-1400 PT Time Calculation (min): 20 min  PT Assessment / Plan / Recommendation Clinical Impression  Pt admitted with dx of divertiulitis presents with limitations in functional mobility caused by ongoing pain and nausea.    PT Assessment  Patient needs continued PT services    Follow Up Recommendations  No PT follow up    Barriers to Discharge        Equipment Recommendations  None recommended by PT    Recommendations for Other Services OT consult   Frequency Min 3X/week    Precautions / Restrictions Precautions Precautions: Fall Restrictions Weight Bearing Restrictions: No   Pertinent Vitals/Pain       Mobility  Bed Mobility Bed Mobility: Rolling Left;Supine to Sit;Left Sidelying to Sit Rolling Left: 4: Min guard;With rail Left Sidelying to Sit: 4: Min guard;HOB elevated Supine to Sit: 4: Min guard;HOB elevated Transfers Transfers: Sit to Stand;Stand to Sit Sit to Stand: 4: Min assist Stand to Sit: 4: Min assist Details for Transfer Assistance: cues for use of UEs to self assist Ambulation/Gait Ambulation/Gait Assistance: 4: Min assist Ambulation Distance (Feet): 5 Feet Assistive device: Rolling walker Ambulation/Gait Assistance Details: min cues for posture and position from RW Gait Pattern: Step-to pattern    Exercises     PT Diagnosis: Difficulty walking;Acute pain  PT Problem List: Decreased activity tolerance;Decreased balance;Decreased mobility;Decreased knowledge of use of DME;Pain PT Treatment Interventions: DME instruction;Gait training;Stair training;Functional mobility training;Therapeutic activities;Patient/family education   PT Goals Acute Rehab PT Goals PT Goal Formulation: With patient Time For Goal Achievement: 06/22/12 Potential to Achieve Goals: Good Pt will go Supine/Side to Sit: with modified  independence PT Goal: Supine/Side to Sit - Progress: Goal set today Pt will go Sit to Supine/Side: with modified independence PT Goal: Sit to Supine/Side - Progress: Goal set today Pt will go Sit to Stand: with modified independence PT Goal: Sit to Stand - Progress: Goal set today Pt will go Stand to Sit: with modified independence PT Goal: Stand to Sit - Progress: Goal set today Pt will Ambulate: >150 feet;with supervision PT Goal: Ambulate - Progress: Goal set today Pt will Go Up / Down Stairs: 1-2 stairs;with supervision;with least restrictive assistive device PT Goal: Up/Down Stairs - Progress: Goal set today  Visit Information  Last PT Received On: 06/15/12 Assistance Needed: +1    Subjective Data  Subjective: I get nauseous when I move around Patient Stated Goal: Resume previous lifestyle   Prior Functioning  Home Living Lives With: Spouse Available Help at Discharge: Family Type of Home: House Home Access: Stairs to enter Secretary/administrator of Steps: 1 Entrance Stairs-Rails: Left Home Layout: One level Home Adaptive Equipment: None Prior Function Level of Independence: Independent Able to Take Stairs?: Yes Driving: Yes Vocation: Full time employment Communication Communication: No difficulties    Cognition  Overall Cognitive Status: Appears within functional limits for tasks assessed/performed Arousal/Alertness: Awake/alert Orientation Level: Appears intact for tasks assessed Behavior During Session: [facility] for tasks performed    Extremity/Trunk Assessment Right Upper Extremity Assessment RUE ROM/Strength/Tone: [facility] for tasks assessed Left Upper Extremity Assessment LUE ROM/Strength/Tone: [facility] for tasks assessed Right Lower Extremity Assessment RLE ROM/Strength/Tone: [facility] for tasks assessed Left Lower Extremity Assessment LLE ROM/Strength/Tone: WFL for tasks assessed   Balance    End of Session PT - End of Session Activity Tolerance: Other (comment) (ltd by  nausea) Patient left: in chair;with call bell/phone within  reach;with family/visitor present Nurse Communication: Mobility status  GP     Teniola Tseng 06/15/2012, 2:16 PM

## 2012-06-15 NOTE — Progress Notes (Signed)
Utilization review completed.  

## 2012-06-15 NOTE — Progress Notes (Signed)
[facility] PROGRESS NOTE  Allison Clark AVW:[id number] DOB: 1950/10/30 DOA: 06/14/2012 PCP: Ezequiel Kayser, MD  Assessment/Plan: 1-Diverticulitis: continue pain meds, PRN antiemetics, continue IV antibiotics and NPO status. Due to findings of stool in the area of inflammation and hx of constipation will ad miralax and colace; patient to ambulate to increase peristalsis. Will follow clinical response and if no BM's in 2 days will repeat abd x-ray.   2-BREAST CANCER: continue tamoxifen  3-Hypothyroidism: continue synthroid  4-ASTHMA/COPD: continue home inhalers; no acute exacerbation appreciated.  5-GERD: continue protonix and pepcid  6-PNA: will continue van and zosyn; start IS, titrate O2 down to RA as tolerated and repeat CXR in am.  7-Constipation: due to thyroid disease and narcotics. Will start miralax and colace.   DVT: SCD's   Code Status: Full Family Communication: Husband at bedside Disposition Plan: back home when medically stable   Brief narrative: 61 year old female with history of GERD, hypothyroidism, asthma presented with left lower quadrant abdominal pain started today shortly after eating and has lasted about 45 minutes, continuously, 8/10 in intensity, non radiating and not relieved with home pain medications.   Consultants:  None  Procedures:  CT abdomen (demonstarting diverticulitis and some stool in her colon)  Antibiotics:  vanc   zosyn  HPI/Subjective: Afebrile; complaining of nausea and LLQ pain. Denies CP, no vomiting.  Objective: Filed Vitals:   06/14/12 1605 06/14/12 2100 06/14/12 2150 06/15/12 0545  BP: 126/44  129/47 108/67  Pulse: 87  86 80  Temp: 98 F (36.7 C)  98.5 F (36.9 C) 98.9 F (37.2 C)  TempSrc: Oral  Oral Oral  Resp: 20  18 18   Height:  5' 4.17" (1.63 m)    Weight:  77.1 kg (169 lb 15.6 oz)    SpO2: 95%  98% 97%    Intake/Output Summary (Last 24 hours) at 06/15/12 1008 Last data filed at 06/15/12 0604  Gross per 24 hour  Intake    300 ml  Output      0 ml  Net    300 ml   Filed Weights   06/14/12 2100  Weight: 77.1 kg (169 lb 15.6 oz)    Exam:   General:  NAD  Cardiovascular: S1 and S2; no rubs or gallops  Respiratory: no crackles, no wheezing; positive rhonchi L > R  Abdomen: soft, no guarding; TTP on LLQ, mild distension and positive BS  Data Reviewed: Basic Metabolic Panel:  Lab 06/15/12 8295 06/14/12 2210 06/14/12 1402 06/12/12 2020  NA 138 137 136 134*  K 3.5 3.6 3.5 3.7  CL 104 103 101 98  CO2 27 27 25 25   GLUCOSE 105* 117* 97 110*  BUN 13 11 15 17   CREATININE 0.62 0.59 0.58 0.56  CALCIUM 8.0* 8.4 8.8 9.2  MG -- 1.9 -- --  PHOS -- 3.2 -- --   Liver Function Tests:  Lab 06/15/12 0407 06/14/12 2210 06/14/12 1402 06/12/12 2020  AST 10 11 15 13   ALT 11 13 14 16   ALKPHOS 37* 38* 43 45  BILITOT 0.3 0.3 0.2* 0.4  PROT 5.6* 5.9* 6.3 6.8  ALBUMIN 2.9* 3.1* 3.4* 3.8    Lab 06/12/12 2020  LIPASE 27  AMYLASE --   CBC:  Lab 06/15/12 0407 06/14/12 2210 06/14/12 1402  WBC 13.2* 16.7* 13.0*  NEUTROABS -- 13.9* 9.4*  HGB 12.0 12.4 12.8  HCT 35.4* 36.5 38.5  MCV 88.3 87.1 88.1  PLT 209 218 242   Cardiac Enzymes:  Lab 06/14/12 1403  CKTOTAL --  CKMB --  CKMBINDEX --  TROPONINI <0.30   CBG:  Lab 06/15/12 0757  GLUCAP 100*    Studies: Ct Abdomen Pelvis W Contrast  06/14/2012  *RADIOLOGY REPORT*  Clinical Data: Progressive left abdominal pain.  History of diverticulitis.  CT ABDOMEN AND PELVIS WITH CONTRAST  Technique:  Multidetector CT imaging of the abdomen and pelvis was performed following the standard protocol during bolus administration of intravenous contrast.  Contrast: OMNIPAQUE IOHEXOL 300 MG/ML  SOLN  Comparison: Acute abdominal series same date.  Abdominal pelvic CT 06/12/2012.  Findings: There is mildly increased atelectasis at both lung bases. No significant pleural effusion is demonstrated.  Multiple hepatic cysts are again noted.   There is a stable cyst posteriorly in the mid left kidney.  The right kidney appears normal.  The spleen, gallbladder, pancreas and adrenal glands appear normal.  Small splenic artery aneurysms are unchanged. There is no evidence of large vessel occlusion.  Distal transverse colon wall thickening and surrounding inflammatory change are stable.  There is no evidence of bowel obstruction, perforation or extraluminal fluid collection.  The small bowel and appendix appear normal.  The uterus, ovaries and bladder appear normal.  There are no acute osseous findings.  IMPRESSION:  1.  Stable probable inflammatory process involving the distal transverse colon and splenic flexure since CT performed 2 days ago. Again, findings most likely represent diverticulitis, although focal ischemic colitis and underlying mucosal lesion cannot be excluded. 2.  No evidence of perforation or obstruction.  No evidence of large vessel occlusion. 3.  Stable hepatic and left renal cysts.   Original Report Authenticated By: Gerrianne Scale, M.D.    Ct Abdomen Pelvis W Contrast  06/12/2012  *RADIOLOGY REPORT*  Clinical Data: Lower abdominal pain, nausea, vomiting and elevated white blood cell count.  CT ABDOMEN AND PELVIS WITH CONTRAST  Technique:  Multidetector CT imaging of the abdomen and pelvis was performed following the standard protocol during bolus administration of intravenous contrast.  Contrast: OMNIPAQUE IOHEXOL 300 MG/ML  SOLN  Comparison: None.  Findings: At the level of the distal transverse colon and splenic flexure, focal inflammation and dilatation of the colon is present with a stool ball present measuring approximately 5 cm.  At this level, inflammatory changes are present surrounding the colon and findings most likely reflect acute diverticulitis.  The colon is also thickened at this level and it would be difficult to exclude underlying tumor or stricture by CT.  The colon distal to this level is decompressed.  No  evidence of enlarged lymph nodes or free intraperitoneal air. No focal abscess or significant free fluid is identified.  Small bowel loops are normal in caliber.  The liver shows multiple cysts which all appear benign.  The largest in the left lobe measures approximately 4.1 cm and in the right lobe the largest measures 3.6 cm.  There is no evidence of biliary ductal dilatation.  The gallbladder is unremarkable.  The pancreas, spleen, adrenal glands and kidneys are unremarkable. There are focal partially calcified aneurysms involving the distal branches of the splenic artery near the splenic hilum.  The larger measures approximately 8 mm in diameter and a smaller aneurysm measures approximately 4 mm in diameter.  No evidence of rupture.  The bladder is unremarkable.  Uterus and adnexal regions are unremarkable by CT.  No hernias are identified.  IMPRESSION:  1.  Inflammation of the distal transverse colon and splenic flexure at the  level of focal stool accumulation.  The findings likely relate to acute diverticulitis.  However, underlying lesion or stricture of the colon cannot be excluded.  There is no evidence of extraluminal air to suggest overt colonic perforation. 2.  Hepatic cysts which appear benign. 3.  Incidental small splenic artery aneurysms with the largest measuring 8 mm and a smaller aneurysm measuring 4 mm.   Original Report Authenticated By: Reola Calkins, M.D.    Dg Abd Acute W/chest  06/14/2012  *RADIOLOGY REPORT*  Clinical Data: Left lower quadrant abdominal pain, nausea and vomiting.  ACUTE ABDOMEN SERIES (ABDOMEN 2 VIEW & CHEST 1 VIEW)  Comparison: Chest dated 06/22/2011 and abdomen and pelvis CT dated 06/12/2012.  Findings: The cardiac silhouette is borderline enlarged.  Interval linear density at the right lung base and patchy density at the left lung base.  Possible small left pleural effusion.  Normal bowel gas pattern without free peritoneal air.  Diffuse osteopenia.  IMPRESSION:  1.   Patchy opacity at the left lung base concerning for pneumonia. 2.  Minimal right basilar atelectasis. 3.  No acute abdominal abnormality.   Original Report Authenticated By: Darrol Angel, M.D.     Scheduled Meds:   . aspirin EC  81 mg Oral Daily  . famotidine  20 mg Oral BID  . fluticasone  2 spray Each Nare Daily  . fluticasone  1 puff Inhalation BID  . irbesartan  150 mg Oral Daily  . levothyroxine  112 mcg Oral Q0600  . loratadine  10 mg Oral Daily  . metronidazole  500 mg Intravenous Once  . montelukast  10 mg Oral Daily  .  morphine injection  4 mg Intravenous Once  .  morphine injection  4 mg Intravenous Once  . multivitamin with minerals  1 tablet Oral Daily  . ondansetron (ZOFRAN) IV  4 mg Intravenous Once  . pantoprazole  40 mg Oral Q1200  . piperacillin-tazobactam  3.375 g Intravenous To ER  . piperacillin-tazobactam (ZOSYN)  IV  3.375 g Intravenous Q8H  . polyethylene glycol  17 g Oral Daily  . senna-docusate  1 tablet Oral BID  . sodium chloride  500 mL Intravenous Once  . tamoxifen  20 mg Oral Daily  . vancomycin  1,000 mg Intravenous Once  . vancomycin  1,000 mg Intravenous Q12H  . DISCONTD: beclomethasone  2 puff Inhalation BID  . DISCONTD: fluticasone  2 puff Inhalation BID  . DISCONTD: metronidazole  500 mg Intravenous Q8H  . DISCONTD: metronidazole  500 mg Intravenous Q8H  . DISCONTD: piperacillin-tazobactam (ZOSYN)  IV  3.375 g Intravenous Once  . DISCONTD: vancomycin  1,000 mg Intravenous Q8H   Continuous Infusions:   . dextrose 5 % and 0.45% NaCl 75 mL/hr at 06/15/12 0959  . DISCONTD: sodium chloride 125 mL/hr at 06/14/12 2209  . DISCONTD: sodium chloride       Time spent: >30    Andray Assefa  [facility] Pager [phone]. If 8PM-8AM, please contact night-coverage at [email or web address], password [facility] 06/15/2012, 10:08 AM  LOS: 1 day

## 2012-06-16 ENCOUNTER — Inpatient Hospital Stay (HOSPITAL_COMMUNITY): Payer: Managed Care, Other (non HMO)

## 2012-06-16 LAB — CBC
HCT: 34 % — ABNORMAL LOW (ref 36.0–46.0)
MCH: 29.1 pg (ref 26.0–34.0)
MCV: 88.3 fL (ref 78.0–100.0)
Platelets: 195 10*3/uL (ref 150–400)
RBC: 3.85 MIL/uL — ABNORMAL LOW (ref 3.87–5.11)

## 2012-06-16 LAB — BASIC METABOLIC PANEL
BUN: 12 mg/dL (ref 6–23)
CO2: 28 mEq/L (ref 19–32)
Calcium: 7.9 mg/dL — ABNORMAL LOW (ref 8.4–10.5)
Creatinine, Ser: 0.61 mg/dL (ref 0.50–1.10)

## 2012-06-16 MED ORDER — POTASSIUM CHLORIDE CRYS ER 20 MEQ PO TBCR
40.0000 meq | EXTENDED_RELEASE_TABLET | ORAL | Status: AC
  Administered 2012-06-16 (×3): 40 meq via ORAL
  Filled 2012-06-16 (×3): qty 2

## 2012-06-16 MED ORDER — BECLOMETHASONE DIPROPIONATE 80 MCG/ACT IN AERS
2.0000 | INHALATION_SPRAY | Freq: Two times a day (BID) | RESPIRATORY_TRACT | Status: DC
  Administered 2012-06-16 – 2012-06-22 (×7): 2 via RESPIRATORY_TRACT

## 2012-06-16 MED ORDER — IRBESARTAN 75 MG PO TABS
75.0000 mg | ORAL_TABLET | Freq: Every day | ORAL | Status: DC
  Administered 2012-06-17: 75 mg via ORAL
  Filled 2012-06-16: qty 1

## 2012-06-16 MED ORDER — SODIUM CHLORIDE 0.9 % IV SOLN
INTRAVENOUS | Status: DC
  Administered 2012-06-16 – 2012-06-18 (×3): via INTRAVENOUS
  Administered 2012-06-19: 150 mL/h via INTRAVENOUS
  Administered 2012-06-19: 09:00:00 via INTRAVENOUS
  Administered 2012-06-19: 150 mL/h via INTRAVENOUS
  Administered 2012-06-19: 16:00:00 via INTRAVENOUS
  Administered 2012-06-20: 150 mL/h via INTRAVENOUS
  Administered 2012-06-20 – 2012-06-21 (×2): via INTRAVENOUS

## 2012-06-16 MED ORDER — PIPERACILLIN-TAZOBACTAM 3.375 G IVPB
3.3750 g | Freq: Three times a day (TID) | INTRAVENOUS | Status: DC
  Administered 2012-06-16 – 2012-06-20 (×13): 3.375 g via INTRAVENOUS
  Filled 2012-06-16 (×17): qty 50

## 2012-06-16 MED ORDER — VANCOMYCIN HCL IN DEXTROSE 1-5 GM/200ML-% IV SOLN
1000.0000 mg | Freq: Two times a day (BID) | INTRAVENOUS | Status: AC
  Administered 2012-06-17: 1000 mg via INTRAVENOUS
  Filled 2012-06-16 (×2): qty 200

## 2012-06-16 MED ORDER — PANTOPRAZOLE SODIUM 40 MG PO TBEC
40.0000 mg | DELAYED_RELEASE_TABLET | Freq: Two times a day (BID) | ORAL | Status: DC
  Administered 2012-06-16 – 2012-06-21 (×10): 40 mg via ORAL
  Filled 2012-06-16 (×14): qty 1

## 2012-06-16 NOTE — Progress Notes (Signed)
Physical Therapy Treatment Patient Details Name: Allison Clark MRN: [id number] DOB: [birth year] Today's Date: 06/16/2012 Time: 7846-9629 PT Time Calculation (min): 10 min  PT Assessment / Plan / Recommendation Comments on Treatment Session  Pt ambulated to bathroom with spouse 1 HHA and then ambulated in hallway 60 feet with RW, distance limited by abdominal pain.    Follow Up Recommendations  No PT follow up    Barriers to Discharge        Equipment Recommendations  None recommended by PT    Recommendations for Other Services    Frequency     Plan Discharge plan remains appropriate;Frequency remains appropriate    Precautions / Restrictions Restrictions Weight Bearing Restrictions: No   Pertinent Vitals/Pain 8/10 abdomen pain, premedicated, repositioned, activity increased, RN notified    Mobility  Bed Mobility Bed Mobility: Right Sidelying to Sit Right Sidelying to Sit: HOB elevated;5: Supervision Transfers Transfers: Sit to Stand;Stand to Sit Sit to Stand: 4: Min guard;From toilet;From bed Stand to Sit: 4: Min guard;To toilet;To chair/3-in-1 Details for Transfer Assistance: slow transfers Ambulation/Gait Ambulation/Gait Assistance: 4: Min guard Ambulation Distance (Feet): 60 Feet Assistive device: Rolling walker Ambulation/Gait Assistance Details: pt doing better with ambulation today however very slow and reports still increased better however better than yesterday, RW used for comfort as pt is steady just with increased pain Gait Pattern: Step-through pattern;Decreased stride length;Trunk flexed    Exercises     PT Diagnosis:    PT Problem List:   PT Treatment Interventions:     PT Goals Acute Rehab PT Goals PT Goal: Supine/Side to Sit - Progress: Progressing toward goal PT Goal: Sit to Stand - Progress: Progressing toward goal PT Goal: Stand to Sit - Progress: Progressing toward goal PT Goal: Ambulate - Progress: Progressing toward goal  Visit  Information  Last PT Received On: 06/16/12 Assistance Needed: +1    Subjective Data  Subjective: I need to use the bathroom first.   Cognition  Overall Cognitive Status: Appears within functional limits for tasks assessed/performed    Balance     End of Session PT - End of Session Activity Tolerance: Patient limited by pain Patient left: in chair;with call bell/phone within reach;with family/visitor present   GP     Coreen Shippee,KATHrine E 06/16/2012, 11:36 AM Pager: [phone]

## 2012-06-16 NOTE — Progress Notes (Signed)
[facility] PROGRESS NOTE  Allison Clark YNW:[id number] DOB: 05/19/1951 DOA: 06/14/2012 PCP: Ezequiel Kayser, MD  Assessment/Plan: 1-Diverticulitis: continue pain meds, PRN antiemetics, continue IV antibiotics and since improving, will change diet to clear liquid and follow clinical response. Due to findings of stool in the area of inflammation and hx of constipation will continue miralax and colace; patient to ambulate to increase peristalsis. Will follow clinical response and if no BM's in 2 days will repeat abd x-ray. Will require bowel regimen and also low residue diet at discharge. In 2-3 weeks colonoscopy as an outpatient.      2-BREAST CANCER: stable, continue tamoxifen  3-Hypothyroidism: continue synthroid  4-ASTHMA/COPD: continue home inhalers; no acute exacerbation appreciated.  5-GERD: continue protonix BID  6-PNA: repeat CXR 2 views indicating opacity is most likely due to scarring and no new infiltrate. Will discontinue vancomycin after today dose; continue IS to prevent and improved atelectasis. Patient with good O2 sat on RA and w/o resp symptoms.Marland Kitchen  7-Constipation: due to thyroid disease and narcotics. Will continue miralax and colace; passing gas but no BM's yet. If no BM's in 48 hours repeat abd x-ray.  8-Hypokalemia: will replete; follow BMET in am  9-HTN: BP stable but soft, due to antihypertensive drugs and also pain meds. Will cut avapro in half for now and continue monitoring.   DVT: SCD's   Code Status: Full Family Communication: Husband at bedside Disposition Plan: back home when medically stable   Brief narrative: 61 year old female with history of GERD, hypothyroidism, asthma presented with left lower quadrant abdominal pain started today shortly after eating and has lasted about 45 minutes, continuously, 8/10 in intensity, non radiating and not relieved with home pain medications.   Consultants:  None  Procedures:  CT abdomen (demonstarting  diverticulitis and some stool in her colon)  Antibiotics:  vanc (9/1-9/3)  Zosyn 9/1  HPI/Subjective: Afebrile; reports feeling better, no nausea or vomiting today; had some during yesterday night. Denies CP, cough or SOB.  Objective: Filed Vitals:   06/15/12 0545 06/15/12 1450 06/15/12 2209 06/16/12 0517  BP: 108/67 127/68 100/58 102/56  Pulse: 80 71 75 79  Temp: 98.9 F (37.2 C) 99.3 F (37.4 C) 98.2 F (36.8 C) 98.5 F (36.9 C)  TempSrc: Oral  Oral Oral  Resp: 18 16 14 12   Height:      Weight:      SpO2: 97% 91% 93% 95%    Intake/Output Summary (Last 24 hours) at 06/16/12 1050 Last data filed at 06/16/12 0900  Gross per 24 hour  Intake    870 ml  Output   1650 ml  Net   -780 ml   Filed Weights   06/14/12 2100  Weight: 77.1 kg (169 lb 15.6 oz)    Exam:   General:  NAD, feeling better, afebrile  Cardiovascular: S1 and S2; no rubs or gallops; positive systolic murmur  Respiratory: no crackles, no wheezing; scattered rhonchi  Abdomen: soft, no guarding; TTP on LLQ (improved from yesterday), positive rebound and positive BS  Neurology: non focal deficit  Data Reviewed: Basic Metabolic Panel:  Lab 06/16/12 6578 06/15/12 0407 06/14/12 2210 06/14/12 1402 06/12/12 2020  NA 137 138 137 136 134*  K 3.1* 3.5 3.6 3.5 3.7  CL 102 104 103 101 98  CO2 28 27 27 25 25   GLUCOSE 103* 105* 117* 97 110*  BUN 12 13 11 15 17   CREATININE 0.61 0.62 0.59 0.58 0.56  CALCIUM 7.9* 8.0* 8.4 8.8  9.2  MG -- -- 1.9 -- --  PHOS -- -- 3.2 -- --   Liver Function Tests:  Lab 06/15/12 0407 06/14/12 2210 06/14/12 1402 06/12/12 2020  AST 10 11 15 13   ALT 11 13 14 16   ALKPHOS 37* 38* 43 45  BILITOT 0.3 0.3 0.2* 0.4  PROT 5.6* 5.9* 6.3 6.8  ALBUMIN 2.9* 3.1* 3.4* 3.8    Lab 06/12/12 2020  LIPASE 27  AMYLASE --   CBC:  Lab 06/16/12 0424 06/15/12 0407 06/14/12 2210 06/14/12 1402  WBC 9.6 13.2* 16.7* 13.0*  NEUTROABS -- -- 13.9* 9.4*  HGB 11.2* 12.0 12.4 12.8  HCT 34.0*  35.4* 36.5 38.5  MCV 88.3 88.3 87.1 88.1  PLT 195 209 218 242   Cardiac Enzymes:  Lab 06/14/12 1403  CKTOTAL --  CKMB --  CKMBINDEX --  TROPONINI <0.30   CBG:  Lab 06/15/12 0757  GLUCAP 100*    Studies: Ct Abdomen Pelvis W Contrast  06/14/2012  *RADIOLOGY REPORT*  Clinical Data: Progressive left abdominal pain.  History of diverticulitis.  CT ABDOMEN AND PELVIS WITH CONTRAST  Technique:  Multidetector CT imaging of the abdomen and pelvis was performed following the standard protocol during bolus administration of intravenous contrast.  Contrast: OMNIPAQUE IOHEXOL 300 MG/ML  SOLN  Comparison: Acute abdominal series same date.  Abdominal pelvic CT 06/12/2012.  Findings: There is mildly increased atelectasis at both lung bases. No significant pleural effusion is demonstrated.  Multiple hepatic cysts are again noted.  There is a stable cyst posteriorly in the mid left kidney.  The right kidney appears normal.  The spleen, gallbladder, pancreas and adrenal glands appear normal.  Small splenic artery aneurysms are unchanged. There is no evidence of large vessel occlusion.  Distal transverse colon wall thickening and surrounding inflammatory change are stable.  There is no evidence of bowel obstruction, perforation or extraluminal fluid collection.  The small bowel and appendix appear normal.  The uterus, ovaries and bladder appear normal.  There are no acute osseous findings.  IMPRESSION:  1.  Stable probable inflammatory process involving the distal transverse colon and splenic flexure since CT performed 2 days ago. Again, findings most likely represent diverticulitis, although focal ischemic colitis and underlying mucosal lesion cannot be excluded. 2.  No evidence of perforation or obstruction.  No evidence of large vessel occlusion. 3.  Stable hepatic and left renal cysts.   Original Report Authenticated By: Gerrianne Scale, M.D.    Ct Abdomen Pelvis W Contrast  06/12/2012  *RADIOLOGY  REPORT*  Clinical Data: Lower abdominal pain, nausea, vomiting and elevated white blood cell count.  CT ABDOMEN AND PELVIS WITH CONTRAST  Technique:  Multidetector CT imaging of the abdomen and pelvis was performed following the standard protocol during bolus administration of intravenous contrast.  Contrast: OMNIPAQUE IOHEXOL 300 MG/ML  SOLN  Comparison: None.  Findings: At the level of the distal transverse colon and splenic flexure, focal inflammation and dilatation of the colon is present with a stool ball present measuring approximately 5 cm.  At this level, inflammatory changes are present surrounding the colon and findings most likely reflect acute diverticulitis.  The colon is also thickened at this level and it would be difficult to exclude underlying tumor or stricture by CT.  The colon distal to this level is decompressed.  No evidence of enlarged lymph nodes or free intraperitoneal air. No focal abscess or significant free fluid is identified.  Small bowel loops are normal in caliber.  The liver shows multiple cysts which all appear benign.  The largest in the left lobe measures approximately 4.1 cm and in the right lobe the largest measures 3.6 cm.  There is no evidence of biliary ductal dilatation.  The gallbladder is unremarkable.  The pancreas, spleen, adrenal glands and kidneys are unremarkable. There are focal partially calcified aneurysms involving the distal branches of the splenic artery near the splenic hilum.  The larger measures approximately 8 mm in diameter and a smaller aneurysm measures approximately 4 mm in diameter.  No evidence of rupture.  The bladder is unremarkable.  Uterus and adnexal regions are unremarkable by CT.  No hernias are identified.  IMPRESSION:  1.  Inflammation of the distal transverse colon and splenic flexure at the level of focal stool accumulation.  The findings likely relate to acute diverticulitis.  However, underlying lesion or stricture of the colon  cannot be excluded.  There is no evidence of extraluminal air to suggest overt colonic perforation. 2.  Hepatic cysts which appear benign. 3.  Incidental small splenic artery aneurysms with the largest measuring 8 mm and a smaller aneurysm measuring 4 mm.   Original Report Authenticated By: Reola Calkins, M.D.    Dg Abd Acute W/chest  06/14/2012  *RADIOLOGY REPORT*  Clinical Data: Left lower quadrant abdominal pain, nausea and vomiting.  ACUTE ABDOMEN SERIES (ABDOMEN 2 VIEW & CHEST 1 VIEW)  Comparison: Chest dated 06/22/2011 and abdomen and pelvis CT dated 06/12/2012.  Findings: The cardiac silhouette is borderline enlarged.  Interval linear density at the right lung base and patchy density at the left lung base.  Possible small left pleural effusion.  Normal bowel gas pattern without free peritoneal air.  Diffuse osteopenia.  IMPRESSION:  1.  Patchy opacity at the left lung base concerning for pneumonia. 2.  Minimal right basilar atelectasis. 3.  No acute abdominal abnormality.   Original Report Authenticated By: Darrol Angel, M.D.     Scheduled Meds:    . aspirin EC  81 mg Oral Daily  . beclomethasone  2 puff Inhalation BID  . fluticasone  2 spray Each Nare Daily  . irbesartan  75 mg Oral Daily  . levothyroxine  112 mcg Oral Q0600  . loratadine  10 mg Oral Daily  . montelukast  10 mg Oral Daily  . multivitamin with minerals  1 tablet Oral Daily  . pantoprazole  40 mg Oral BID AC  . piperacillin-tazobactam (ZOSYN)  IV  3.375 g Intravenous Q8H  . polyethylene glycol  17 g Oral Daily  . potassium chloride  40 mEq Oral Q4H  . senna-docusate  1 tablet Oral BID  . tamoxifen  20 mg Oral Daily  . vancomycin  1,000 mg Intravenous Q12H  . DISCONTD: famotidine  20 mg Oral BID  . DISCONTD: fluticasone  1 puff Inhalation BID  . DISCONTD: irbesartan  150 mg Oral Daily  . DISCONTD: pantoprazole  40 mg Oral Q1200   Continuous Infusions:    . sodium chloride    . DISCONTD: dextrose 5 % and  0.45% NaCl 75 mL/hr at 06/15/12 2201     Time spent: >30    [facility]  [facility] Pager [phone]. If 8PM-8AM, please contact night-coverage at [email or web address], password [facility] 06/16/2012, 10:50 AM  LOS: 2 days

## 2012-06-17 DIAGNOSIS — K5289 Other specified noninfective gastroenteritis and colitis: Secondary | ICD-10-CM

## 2012-06-17 LAB — CBC
MCH: 29.8 pg (ref 26.0–34.0)
MCHC: 34 g/dL (ref 30.0–36.0)
MCV: 87.7 fL (ref 78.0–100.0)
Platelets: 197 10*3/uL (ref 150–400)
RBC: 3.89 MIL/uL (ref 3.87–5.11)
RDW: 13.1 % (ref 11.5–15.5)

## 2012-06-17 LAB — BASIC METABOLIC PANEL
BUN: 16 mg/dL (ref 6–23)
CO2: 26 mEq/L (ref 19–32)
Calcium: 8.3 mg/dL — ABNORMAL LOW (ref 8.4–10.5)
Creatinine, Ser: 1.51 mg/dL — ABNORMAL HIGH (ref 0.50–1.10)
GFR calc non Af Amer: 36 mL/min — ABNORMAL LOW (ref >90)
Glucose, Bld: 96 mg/dL (ref 70–99)
Sodium: 139 mEq/L (ref 135–145)

## 2012-06-17 MED ORDER — LEVOTHYROXINE SODIUM 112 MCG PO TABS
112.0000 ug | ORAL_TABLET | Freq: Every day | ORAL | Status: DC
  Administered 2012-06-17 – 2012-06-22 (×6): 112 ug via ORAL

## 2012-06-17 NOTE — Progress Notes (Signed)
ANTIBIOTIC CONSULT NOTE - FOLLOW UP  Pharmacy Consult for Zosyn Indication: Diverticulitis  Allergies  Allergen Reactions  . Avelox (Moxifloxacin Hcl In Nacl) Hives  . Sulfonamide Derivatives     REACTION: rash    Patient Measurements: Height: 5' 4.17" (163 cm) Weight: 169 lb 15.6 oz (77.1 kg) IBW/kg (Calculated) : 55.1   Vital Signs: Temp: 99.3 F (37.4 C) (09/04 0509) Temp src: Oral (09/04 0509) BP: 147/61 mmHg (09/04 0509) Pulse Rate: 96  (09/04 0509) Intake/Output from previous day: 09/03 0701 - 09/04 0700 In: 125.8 [I.V.:125.8] Out: 2000 [Urine:2000] Intake/Output from this shift: Total I/O In: -  Out: 200 [Urine:200]  Labs:  [facility] 06/17/12 0641 06/17/12 0350 06/16/12 0424 06/15/12 0407  WBC 14.3* -- 9.6 13.2*  HGB 11.6* -- 11.2* 12.0  PLT 197 -- 195 209  LABCREA -- -- -- --  CREATININE -- 1.51* 0.61 0.62   Estimated Creatinine Clearance: 39.5 ml/min (by C-G formula based on Cr of 1.51).   Microbiology: Recent Results (from the past 720 hour(s))  CULTURE, BLOOD (ROUTINE X 2)     Status: Normal (Preliminary result)   Collection Time   06/14/12  9:00 PM      Component Value Range Status Comment   Specimen Description BLOOD R FOREARM   Final    Special Requests BOTTLES DRAWN AEROBIC AND ANAEROBIC 4CC   Final    Culture  Setup Time 06/15/2012 05:38   Final    Culture     Final    Value:        BLOOD CULTURE RECEIVED NO GROWTH TO DATE CULTURE WILL BE HELD FOR 5 DAYS BEFORE ISSUING A FINAL NEGATIVE REPORT   Report Status PENDING   Incomplete   CULTURE, BLOOD (ROUTINE X 2)     Status: Normal (Preliminary result)   Collection Time   06/14/12 10:10 PM      Component Value Range Status Comment   Specimen Description BLOOD R HAND   Final    Special Requests BOTTLES DRAWN AEROBIC ONLY 9CC   Final    Culture  Setup Time 06/15/2012 05:38   Final    Culture     Final    Value:        BLOOD CULTURE RECEIVED NO GROWTH TO DATE CULTURE WILL BE HELD FOR 5 DAYS BEFORE  ISSUING A FINAL NEGATIVE REPORT   Report Status PENDING   Incomplete     Anti-infectives     Start     Dose/Rate Route Frequency Ordered Stop   06/16/12 1700   vancomycin (VANCOCIN) IVPB 1000 mg/200 mL premix        1,000 mg 200 mL/hr over 60 Minutes Intravenous Every 12 hours 06/16/12 1124 06/17/12 0102   06/16/12 1400  piperacillin-tazobactam (ZOSYN) IVPB 3.375 g       3.375 g 12.5 mL/hr over 240 Minutes Intravenous 3 times per day 06/16/12 1123     06/15/12 0000   piperacillin-tazobactam (ZOSYN) IVPB 3.375 g  Status:  Discontinued        3.375 g 12.5 mL/hr over 240 Minutes Intravenous 3 times per day 06/14/12 2109 06/16/12 1050   06/14/12 2200   metroNIDAZOLE (FLAGYL) IVPB 500 mg  Status:  Discontinued        500 mg 100 mL/hr over 60 Minutes Intravenous Every 8 hours 06/14/12 2116 06/15/12 0941   06/14/12 2145   metroNIDAZOLE (FLAGYL) IVPB 500 mg  Status:  Discontinued        500 mg 100  mL/hr over 60 Minutes Intravenous Every 8 hours 06/14/12 2134 06/14/12 2140   06/14/12 2130   vancomycin (VANCOCIN) IVPB 1000 mg/200 mL premix  Status:  Discontinued        1,000 mg 200 mL/hr over 60 Minutes Intravenous Every 8 hours 06/14/12 2050 06/14/12 2111   06/14/12 2115   vancomycin (VANCOCIN) IVPB 1000 mg/200 mL premix  Status:  Discontinued        1,000 mg 200 mL/hr over 60 Minutes Intravenous Every 12 hours 06/14/12 2111 06/16/12 1050   06/14/12 1545   vancomycin (VANCOCIN) IVPB 1000 mg/200 mL premix        1,000 mg 200 mL/hr over 60 Minutes Intravenous  Once 06/14/12 1542 06/14/12 1817   06/14/12 1500  piperacillin-tazobactam (ZOSYN) IVPB 3.375 g       3.375 g 100 mL/hr over 30 Minutes Intravenous To Emergency Dept 06/14/12 1407 06/14/12 1641   06/14/12 1415   metroNIDAZOLE (FLAGYL) IVPB 500 mg        500 mg 100 mL/hr over 60 Minutes Intravenous  Once 06/14/12 1402 06/14/12 1518   06/14/12 1415   piperacillin-tazobactam (ZOSYN) IVPB 3.375 g  Status:  Discontinued         3.375 g 12.5 mL/hr over 240 Minutes Intravenous  Once 06/14/12 1402 06/14/12 1406          Assessment:  61 YOF with diverticulitis, on day #4 Zosyn; Vancomycin d/c 9/3 since PNA ruled out.  Scr has doubled overnight (0.61 >> 1.51), CrCl ~40 ml/min  Goal of Therapy:  Appropriate renal dosing Eradication of infection  Plan:   Continue Zosyn 3.375gm IV q8h (4hr extended infusions)  Follow Scr closely, will need to adjust dose if CrCl falls < 20 ml/min  Follow up cultures  Loralee Pacas, PharmD, BCPS Pager: [phone] 06/17/2012,9:27 AM

## 2012-06-17 NOTE — Progress Notes (Signed)
[facility] PROGRESS NOTE  Allison Clark:[id number] DOB: 05-Dec-1950 DOA: 06/14/2012 PCP: Ezequiel Kayser, MD  Assessment/Plan: Principal Problem:  *Diverticulitis Active Problems:  BREAST CANCER  ASTHMA  GERD  Healthcare-associated pneumonia   Acute diverticulitis -Patient is on Zosyn, currently no fever or chills. -On clear liquids, tolerating very well, denies any abdominal pain. I will advance to full liquids. -Pain is controlled with IV narcotics. -Patient will need colonoscopy as outpatient.  Breast cancer -Stable chronic condition, continue tamoxifen.  Hypothyroidism -stable, chronic condition, continue Synthroid.  Hypertension -Blood pressure stable, but in the low side. -This is likely secondary to the effect of IV narcotics augmenting her blood pressure medicines.  Code Status: Full Family Communication: Husband at bedside, plan explained to both. Disposition Plan: Likely home when she is medical stable.   Brief narrative: 61 year old Caucasian female with past medical history of hypertension and hypothyroidism came in to the hospital because of abdominal pain, CT scan showed diverticulitis. Patient is on Zosyn. She tolerates clear liquid solid mass for liquids today.  Consultants:  None  Procedures:  None  Antibiotics:  Zosyn  HPI/Subjective: Feels much better, tolerating her clear liquids very well. Denies any nausea or vomiting.  Objective: Filed Vitals:   06/16/12 1400 06/16/12 2046 06/16/12 2138 06/17/12 0509  BP: 124/71  129/55 147/61  Pulse: 84  81 96  Temp: 98.3 F (36.8 C)  99.2 F (37.3 C) 99.3 F (37.4 C)  TempSrc: Oral  Oral Oral  Resp: 18  16 18   Height:      Weight:      SpO2: 89% 93% 92% 91%    Intake/Output Summary (Last 24 hours) at 06/17/12 1218 Last data filed at 06/17/12 1000  Gross per 24 hour  Intake 725.83 ml  Output   1800 ml  Net -1074.17 ml   Filed Weights   06/14/12 2100  Weight: 77.1 kg (169 lb  15.6 oz)    Exam: General: Alert and awake, oriented x3, not in any acute distress. HEENT: anicteric sclera, pupils reactive to light and accommodation, EOMI CVS: S1-S2 clear, no murmur rubs or gallops Chest: clear to auscultation bilaterally, no wheezing, rales or rhonchi Abdomen: soft nontender, nondistended, normal bowel sounds, no organomegaly Extremities: no cyanosis, clubbing or edema noted bilaterally Neuro: Cranial nerves II-XII intact, no focal neurological deficits  Data Reviewed: Basic Metabolic Panel:  Lab 06/17/12 8119 06/16/12 0424 06/15/12 0407 06/14/12 2210 06/14/12 1402  NA 139 137 138 137 136  K 4.5 3.1* 3.5 3.6 3.5  CL 105 102 104 103 101  CO2 26 28 27 27 25   GLUCOSE 96 103* 105* 117* 97  BUN 16 12 13 11 15   CREATININE 1.51* 0.61 0.62 0.59 0.58  CALCIUM 8.3* 7.9* 8.0* 8.4 8.8  MG -- -- -- 1.9 --  PHOS -- -- -- 3.2 --   Liver Function Tests:  Lab 06/15/12 0407 06/14/12 2210 06/14/12 1402 06/12/12 2020  AST 10 11 15 13   ALT 11 13 14 16   ALKPHOS 37* 38* 43 45  BILITOT 0.3 0.3 0.2* 0.4  PROT 5.6* 5.9* 6.3 6.8  ALBUMIN 2.9* 3.1* 3.4* 3.8    Lab 06/12/12 2020  LIPASE 27  AMYLASE --   No results found for this basename: AMMONIA:5 in the last 168 hours CBC:  Lab 06/17/12 0641 06/16/12 0424 06/15/12 0407 06/14/12 2210 06/14/12 1402  WBC 14.3* 9.6 13.2* 16.7* 13.0*  NEUTROABS -- -- -- 13.9* 9.4*  HGB 11.6* 11.2* 12.0 12.4 12.8  HCT 34.1* 34.0* 35.4* 36.5 38.5  MCV 87.7 88.3 88.3 87.1 88.1  PLT 197 195 209 218 242   Cardiac Enzymes:  Lab 06/14/12 1403  CKTOTAL --  CKMB --  CKMBINDEX --  TROPONINI <0.30   BNP (last 3 results) No results found for this basename: PROBNP:3 in the last 8760 hours CBG:  Lab 06/15/12 0757  GLUCAP 100*    Recent Results (from the past 240 hour(s))  CULTURE, BLOOD (ROUTINE X 2)     Status: Normal (Preliminary result)   Collection Time   06/14/12  9:00 PM      Component Value Range Status Comment   Specimen  Description BLOOD R FOREARM   Final    Special Requests BOTTLES DRAWN AEROBIC AND ANAEROBIC 4CC   Final    Culture  Setup Time 06/15/2012 05:38   Final    Culture     Final    Value:        BLOOD CULTURE RECEIVED NO GROWTH TO DATE CULTURE WILL BE HELD FOR 5 DAYS BEFORE ISSUING A FINAL NEGATIVE REPORT   Report Status PENDING   Incomplete   CULTURE, BLOOD (ROUTINE X 2)     Status: Normal (Preliminary result)   Collection Time   06/14/12 10:10 PM      Component Value Range Status Comment   Specimen Description BLOOD R HAND   Final    Special Requests BOTTLES DRAWN AEROBIC ONLY 9CC   Final    Culture  Setup Time 06/15/2012 05:38   Final    Culture     Final    Value:        BLOOD CULTURE RECEIVED NO GROWTH TO DATE CULTURE WILL BE HELD FOR 5 DAYS BEFORE ISSUING A FINAL NEGATIVE REPORT   Report Status PENDING   Incomplete      Studies: Dg Chest 2 View  06/16/2012  *RADIOLOGY REPORT*  Clinical Data: Follow up pneumonia.  No cough for congestion. History breast cancer.  CHEST - 2 VIEW  Comparison: 06/22/2011.  Findings: Left lower lobe opacity is present.  This is in the retrocardiac region, better seen on the lateral view.  Increased density over the lower thoracic spine.  The cardiopericardial silhouette appears within normal limits.  Stable prominence of the ascending aorta on the lateral view.  Basilar atelectasis present on the right.  Small pleural effusion on the left.  Follow-up to ensure radiographic clearing recommended.  Clearing is usually observed at 8 weeks.  IMPRESSION: Left lower lobe opacity.  On prior abdominal CT, this area was visualized and represents an area of atelectasis and / or scarring. Superimposed pneumonia is difficult to exclude but based on the prior CT, pneumonia is unlikely.  Follow-up to ensure resolution is recommended.   Original Report Authenticated By: Andreas Newport, M.D.    Ct Abdomen Pelvis W Contrast  06/14/2012  *RADIOLOGY REPORT*  Clinical Data: Progressive  left abdominal pain.  History of diverticulitis.  CT ABDOMEN AND PELVIS WITH CONTRAST  Technique:  Multidetector CT imaging of the abdomen and pelvis was performed following the standard protocol during bolus administration of intravenous contrast.  Contrast: OMNIPAQUE IOHEXOL 300 MG/ML  SOLN  Comparison: Acute abdominal series same date.  Abdominal pelvic CT 06/12/2012.  Findings: There is mildly increased atelectasis at both lung bases. No significant pleural effusion is demonstrated.  Multiple hepatic cysts are again noted.  There is a stable cyst posteriorly in the mid left kidney.  The right kidney appears normal.  The spleen, gallbladder, pancreas and adrenal glands appear normal.  Small splenic artery aneurysms are unchanged. There is no evidence of large vessel occlusion.  Distal transverse colon wall thickening and surrounding inflammatory change are stable.  There is no evidence of bowel obstruction, perforation or extraluminal fluid collection.  The small bowel and appendix appear normal.  The uterus, ovaries and bladder appear normal.  There are no acute osseous findings.  IMPRESSION:  1.  Stable probable inflammatory process involving the distal transverse colon and splenic flexure since CT performed 2 days ago. Again, findings most likely represent diverticulitis, although focal ischemic colitis and underlying mucosal lesion cannot be excluded. 2.  No evidence of perforation or obstruction.  No evidence of large vessel occlusion. 3.  Stable hepatic and left renal cysts.   Original Report Authenticated By: Gerrianne Scale, M.D.    Ct Abdomen Pelvis W Contrast  06/12/2012  *RADIOLOGY REPORT*  Clinical Data: Lower abdominal pain, nausea, vomiting and elevated white blood cell count.  CT ABDOMEN AND PELVIS WITH CONTRAST  Technique:  Multidetector CT imaging of the abdomen and pelvis was performed following the standard protocol during bolus administration of intravenous contrast.  Contrast:  OMNIPAQUE IOHEXOL 300 MG/ML  SOLN  Comparison: None.  Findings: At the level of the distal transverse colon and splenic flexure, focal inflammation and dilatation of the colon is present with a stool ball present measuring approximately 5 cm.  At this level, inflammatory changes are present surrounding the colon and findings most likely reflect acute diverticulitis.  The colon is also thickened at this level and it would be difficult to exclude underlying tumor or stricture by CT.  The colon distal to this level is decompressed.  No evidence of enlarged lymph nodes or free intraperitoneal air. No focal abscess or significant free fluid is identified.  Small bowel loops are normal in caliber.  The liver shows multiple cysts which all appear benign.  The largest in the left lobe measures approximately 4.1 cm and in the right lobe the largest measures 3.6 cm.  There is no evidence of biliary ductal dilatation.  The gallbladder is unremarkable.  The pancreas, spleen, adrenal glands and kidneys are unremarkable. There are focal partially calcified aneurysms involving the distal branches of the splenic artery near the splenic hilum.  The larger measures approximately 8 mm in diameter and a smaller aneurysm measures approximately 4 mm in diameter.  No evidence of rupture.  The bladder is unremarkable.  Uterus and adnexal regions are unremarkable by CT.  No hernias are identified.  IMPRESSION:  1.  Inflammation of the distal transverse colon and splenic flexure at the level of focal stool accumulation.  The findings likely relate to acute diverticulitis.  However, underlying lesion or stricture of the colon cannot be excluded.  There is no evidence of extraluminal air to suggest overt colonic perforation. 2.  Hepatic cysts which appear benign. 3.  Incidental small splenic artery aneurysms with the largest measuring 8 mm and a smaller aneurysm measuring 4 mm.   Original Report Authenticated By: Reola Calkins, M.D.     Dg Abd Acute W/chest  06/14/2012  *RADIOLOGY REPORT*  Clinical Data: Left lower quadrant abdominal pain, nausea and vomiting.  ACUTE ABDOMEN SERIES (ABDOMEN 2 VIEW & CHEST 1 VIEW)  Comparison: Chest dated 06/22/2011 and abdomen and pelvis CT dated 06/12/2012.  Findings: The cardiac silhouette is borderline enlarged.  Interval linear density at the right lung base and patchy density at the left  lung base.  Possible small left pleural effusion.  Normal bowel gas pattern without free peritoneal air.  Diffuse osteopenia.  IMPRESSION:  1.  Patchy opacity at the left lung base concerning for pneumonia. 2.  Minimal right basilar atelectasis. 3.  No acute abdominal abnormality.   Original Report Authenticated By: Darrol Angel, M.D.     Scheduled Meds:   . aspirin EC  81 mg Oral Daily  . beclomethasone  2 puff Inhalation BID  . fluticasone  2 spray Each Nare Daily  . irbesartan  75 mg Oral Daily  . levothyroxine  112 mcg Oral Q0600  . loratadine  10 mg Oral Daily  . montelukast  10 mg Oral Daily  . multivitamin with minerals  1 tablet Oral Daily  . pantoprazole  40 mg Oral BID AC  . piperacillin-tazobactam (ZOSYN)  IV  3.375 g Intravenous Q8H  . polyethylene glycol  17 g Oral Daily  . potassium chloride  40 mEq Oral Q4H  . senna-docusate  1 tablet Oral BID  . tamoxifen  20 mg Oral Daily  . vancomycin  1,000 mg Intravenous Q12H  . DISCONTD: levothyroxine  112 mcg Oral Q0600   Continuous Infusions:   . sodium chloride 50 mL/hr at 06/16/12 2130    Principal Problem:  *Diverticulitis Active Problems:  BREAST CANCER  ASTHMA  GERD  Healthcare-associated pneumonia    Time spent: 35 minutes    [facility] A  [facility] Pager [phone]. If 8PM-8AM, please contact night-coverage at [email or web address], password [facility] 06/17/2012, 12:18 PM  LOS: 3 days

## 2012-06-17 NOTE — Progress Notes (Signed)
PT Cancellation Note  Treatment cancelled today due to pt reports she has already been up and ambulating with husband multiple times today. Will check back another day for tx session. Thanks.   Allison Clark Alert [facility] 06/17/2012, 2:20 PM [phone]

## 2012-06-18 ENCOUNTER — Inpatient Hospital Stay (HOSPITAL_COMMUNITY): Payer: Managed Care, Other (non HMO)

## 2012-06-18 DIAGNOSIS — N179 Acute kidney failure, unspecified: Secondary | ICD-10-CM | POA: Diagnosis not present

## 2012-06-18 HISTORY — DX: Acute kidney failure, unspecified: N17.9

## 2012-06-18 LAB — CBC
MCV: 87.8 fL (ref 78.0–100.0)
Platelets: 217 10*3/uL (ref 150–400)
RBC: 3.92 MIL/uL (ref 3.87–5.11)
RDW: 13.1 % (ref 11.5–15.5)
WBC: 12.1 10*3/uL — ABNORMAL HIGH (ref 4.0–10.5)

## 2012-06-18 LAB — SODIUM, URINE, RANDOM: Sodium, Ur: 32 mEq/L

## 2012-06-18 LAB — BASIC METABOLIC PANEL
Calcium: 8.2 mg/dL — ABNORMAL LOW (ref 8.4–10.5)
Creatinine, Ser: 1.92 mg/dL — ABNORMAL HIGH (ref 0.50–1.10)
GFR calc Af Amer: 31 mL/min — ABNORMAL LOW (ref >90)
GFR calc non Af Amer: 27 mL/min — ABNORMAL LOW (ref >90)
Sodium: 138 mEq/L (ref 135–145)

## 2012-06-18 NOTE — Progress Notes (Signed)
[facility] PROGRESS NOTE  GRACELIN WEISBERG WUJ:[id number] DOB: [birth year] DOA: 06/14/2012 PCP: Ezequiel Kayser, MD  Assessment/Plan: Principal Problem:  *Diverticulitis Active Problems:  BREAST CANCER  ASTHMA  GERD  Healthcare-associated pneumonia  ARF (acute renal failure)   Acute diverticulitis -Patient is on Zosyn, currently no fever or chills. -On clear liquids, tolerating very well, denies any abdominal pain. I will advance to full liquids. -Pain is controlled with IV narcotics. -Patient will need colonoscopy as outpatient. I will advance her diet to soft mechanical diet.  Acute renal failure -This is likely secondary to dehydration from poor oral intake and vomiting. -Renal ultrasound showed evidence of chronic medical renal disease. -Increased IV fluids, check BMP in the morning.  Breast cancer -Stable chronic condition, continue tamoxifen.  Hypothyroidism -stable, chronic condition, continue Synthroid.  Hypertension -Blood pressure stable, but in the low side. -This is likely secondary to the effect of IV narcotics augmenting her blood pressure medicines.  Code Status: Full Family Communication: Husband at bedside, plan explained to both. Disposition Plan: Likely home when she is medical stable.   Brief narrative: 61 year old Caucasian female with past medical history of hypertension and hypothyroidism came in to the hospital because of abdominal pain, CT scan showed diverticulitis. Patient is on Zosyn. She tolerates clear liquid solid mass for liquids today.  Consultants:  None  Procedures:  None  Antibiotics:  Zosyn  HPI/Subjective: Feels much better, tolerating her clear liquids very well. Denies any nausea or vomiting.  Objective: Filed Vitals:   06/17/12 0509 06/17/12 1345 06/17/12 2152 06/18/12 0620  BP: 147/61 120/69 137/50 142/54  Pulse: 96 89 89 85  Temp: 99.3 F (37.4 C) 97.9 F (36.6 C) 99.2 F (37.3 C) 99.4 F (37.4 C)    TempSrc: Oral Oral Oral Oral  Resp: 18 16 18 16   Height:      Weight:      SpO2: 91% 92% 98% 93%    Intake/Output Summary (Last 24 hours) at 06/18/12 1357 Last data filed at 06/18/12 0956  Gross per 24 hour  Intake   2820 ml  Output   2800 ml  Net     20 ml   Filed Weights   06/14/12 2100  Weight: 77.1 kg (169 lb 15.6 oz)    Exam: General: Alert and awake, oriented x3, not in any acute distress. HEENT: anicteric sclera, pupils reactive to light and accommodation, EOMI CVS: S1-S2 clear, no murmur rubs or gallops Chest: clear to auscultation bilaterally, no wheezing, rales or rhonchi Abdomen: soft nontender, nondistended, normal bowel sounds, no organomegaly Extremities: no cyanosis, clubbing or edema noted bilaterally Neuro: Cranial nerves II-XII intact, no focal neurological deficits  Data Reviewed: Basic Metabolic Panel:  Lab 06/18/12 9562 06/17/12 0350 06/16/12 0424 06/15/12 0407 06/14/12 2210  NA 138 139 137 138 137  K 3.5 4.5 3.1* 3.5 3.6  CL 104 105 102 104 103  CO2 25 26 28 27 27   GLUCOSE 106* 96 103* 105* 117*  BUN 15 16 12 13 11   CREATININE 1.92* 1.51* 0.61 0.62 0.59  CALCIUM 8.2* 8.3* 7.9* 8.0* 8.4  MG -- -- -- -- 1.9  PHOS -- -- -- -- 3.2   Liver Function Tests:  Lab 06/15/12 0407 06/14/12 2210 06/14/12 1402 06/12/12 2020  AST 10 11 15 13   ALT 11 13 14 16   ALKPHOS 37* 38* 43 45  BILITOT 0.3 0.3 0.2* 0.4  PROT 5.6* 5.9* 6.3 6.8  ALBUMIN 2.9* 3.1* 3.4* 3.8    Lab  06/12/12 2020  LIPASE 27  AMYLASE --   No results found for this basename: AMMONIA:5 in the last 168 hours CBC:  Lab 06/18/12 0444 06/17/12 0641 06/16/12 0424 06/15/12 0407 06/14/12 2210 06/14/12 1402  WBC 12.1* 14.3* 9.6 13.2* 16.7* --  NEUTROABS -- -- -- -- 13.9* 9.4*  HGB 11.5* 11.6* 11.2* 12.0 12.4 --  HCT 34.4* 34.1* 34.0* 35.4* 36.5 --  MCV 87.8 87.7 88.3 88.3 87.1 --  PLT 217 197 195 209 218 --   Cardiac Enzymes:  Lab 06/14/12 1403  CKTOTAL --  CKMB --  CKMBINDEX --   TROPONINI <0.30   BNP (last 3 results) No results found for this basename: PROBNP:3 in the last 8760 hours CBG:  Lab 06/15/12 0757  GLUCAP 100*    Recent Results (from the past 240 hour(s))  CULTURE, BLOOD (ROUTINE X 2)     Status: Normal (Preliminary result)   Collection Time   06/14/12  9:00 PM      Component Value Range Status Comment   Specimen Description BLOOD R FOREARM   Final    Special Requests BOTTLES DRAWN AEROBIC AND ANAEROBIC 4CC   Final    Culture  Setup Time 06/15/2012 05:38   Final    Culture     Final    Value:        BLOOD CULTURE RECEIVED NO GROWTH TO DATE CULTURE WILL BE HELD FOR 5 DAYS BEFORE ISSUING A FINAL NEGATIVE REPORT   Report Status PENDING   Incomplete   CULTURE, BLOOD (ROUTINE X 2)     Status: Normal (Preliminary result)   Collection Time   06/14/12 10:10 PM      Component Value Range Status Comment   Specimen Description BLOOD R HAND   Final    Special Requests BOTTLES DRAWN AEROBIC ONLY [facility]   Final    Culture  Setup Time 06/15/2012 05:38   Final    Culture     Final    Value:        BLOOD CULTURE RECEIVED NO GROWTH TO DATE CULTURE WILL BE HELD FOR 5 DAYS BEFORE ISSUING A FINAL NEGATIVE REPORT   Report Status PENDING   Incomplete      Studies: Dg Chest 2 View  06/16/2012  *RADIOLOGY REPORT*  Clinical Data: Follow up pneumonia.  No cough for congestion. History breast cancer.  CHEST - 2 VIEW  Comparison: 06/22/2011.  Findings: Left lower lobe opacity is present.  This is in the retrocardiac region, better seen on the lateral view.  Increased density over the lower thoracic spine.  The cardiopericardial silhouette appears within normal limits.  Stable prominence of the ascending aorta on the lateral view.  Basilar atelectasis present on the right.  Small pleural effusion on the left.  Follow-up to ensure radiographic clearing recommended.  Clearing is usually observed at 8 weeks.  IMPRESSION: Left lower lobe opacity.  On prior abdominal CT, this area was  visualized and represents an area of atelectasis and / or scarring. Superimposed pneumonia is difficult to exclude but based on the prior CT, pneumonia is unlikely.  Follow-up to ensure resolution is recommended.   Original Report Authenticated By: Andreas Newport, M.D.    Ct Abdomen Pelvis W Contrast  06/14/2012  *RADIOLOGY REPORT*  Clinical Data: Progressive left abdominal pain.  History of diverticulitis.  CT ABDOMEN AND PELVIS WITH CONTRAST  Technique:  Multidetector CT imaging of the abdomen and pelvis was performed following the standard protocol during bolus administration of  intravenous contrast.  Contrast: OMNIPAQUE IOHEXOL 300 MG/ML  SOLN  Comparison: Acute abdominal series same date.  Abdominal pelvic CT 06/12/2012.  Findings: There is mildly increased atelectasis at both lung bases. No significant pleural effusion is demonstrated.  Multiple hepatic cysts are again noted.  There is a stable cyst posteriorly in the mid left kidney.  The right kidney appears normal.  The spleen, gallbladder, pancreas and adrenal glands appear normal.  Small splenic artery aneurysms are unchanged. There is no evidence of large vessel occlusion.  Distal transverse colon wall thickening and surrounding inflammatory change are stable.  There is no evidence of bowel obstruction, perforation or extraluminal fluid collection.  The small bowel and appendix appear normal.  The uterus, ovaries and bladder appear normal.  There are no acute osseous findings.  IMPRESSION:  1.  Stable probable inflammatory process involving the distal transverse colon and splenic flexure since CT performed 2 days ago. Again, findings most likely represent diverticulitis, although focal ischemic colitis and underlying mucosal lesion cannot be excluded. 2.  No evidence of perforation or obstruction.  No evidence of large vessel occlusion. 3.  Stable hepatic and left renal cysts.   Original Report Authenticated By: Gerrianne Scale, M.D.    Ct  Abdomen Pelvis W Contrast  06/12/2012  *RADIOLOGY REPORT*  Clinical Data: Lower abdominal pain, nausea, vomiting and elevated white blood cell count.  CT ABDOMEN AND PELVIS WITH CONTRAST  Technique:  Multidetector CT imaging of the abdomen and pelvis was performed following the standard protocol during bolus administration of intravenous contrast.  Contrast: OMNIPAQUE IOHEXOL 300 MG/ML  SOLN  Comparison: None.  Findings: At the level of the distal transverse colon and splenic flexure, focal inflammation and dilatation of the colon is present with a stool ball present measuring approximately 5 cm.  At this level, inflammatory changes are present surrounding the colon and findings most likely reflect acute diverticulitis.  The colon is also thickened at this level and it would be difficult to exclude underlying tumor or stricture by CT.  The colon distal to this level is decompressed.  No evidence of enlarged lymph nodes or free intraperitoneal air. No focal abscess or significant free fluid is identified.  Small bowel loops are normal in caliber.  The liver shows multiple cysts which all appear benign.  The largest in the left lobe measures approximately 4.1 cm and in the right lobe the largest measures 3.6 cm.  There is no evidence of biliary ductal dilatation.  The gallbladder is unremarkable.  The pancreas, spleen, adrenal glands and kidneys are unremarkable. There are focal partially calcified aneurysms involving the distal branches of the splenic artery near the splenic hilum.  The larger measures approximately 8 mm in diameter and a smaller aneurysm measures approximately 4 mm in diameter.  No evidence of rupture.  The bladder is unremarkable.  Uterus and adnexal regions are unremarkable by CT.  No hernias are identified.  IMPRESSION:  1.  Inflammation of the distal transverse colon and splenic flexure at the level of focal stool accumulation.  The findings likely relate to acute diverticulitis.   However, underlying lesion or stricture of the colon cannot be excluded.  There is no evidence of extraluminal air to suggest overt colonic perforation. 2.  Hepatic cysts which appear benign. 3.  Incidental small splenic artery aneurysms with the largest measuring 8 mm and a smaller aneurysm measuring 4 mm.   Original Report Authenticated By: Reola Calkins, M.D.  Dg Abd Acute W/chest  06/14/2012  *RADIOLOGY REPORT*  Clinical Data: Left lower quadrant abdominal pain, nausea and vomiting.  ACUTE ABDOMEN SERIES (ABDOMEN 2 VIEW & CHEST 1 VIEW)  Comparison: Chest dated 06/22/2011 and abdomen and pelvis CT dated 06/12/2012.  Findings: The cardiac silhouette is borderline enlarged.  Interval linear density at the right lung base and patchy density at the left lung base.  Possible small left pleural effusion.  Normal bowel gas pattern without free peritoneal air.  Diffuse osteopenia.  IMPRESSION:  1.  Patchy opacity at the left lung base concerning for pneumonia. 2.  Minimal right basilar atelectasis. 3.  No acute abdominal abnormality.   Original Report Authenticated By: Darrol Angel, M.D.     Scheduled Meds:    . aspirin EC  81 mg Oral Daily  . beclomethasone  2 puff Inhalation BID  . fluticasone  2 spray Each Nare Daily  . levothyroxine  112 mcg Oral Q0600  . loratadine  10 mg Oral Daily  . montelukast  10 mg Oral Daily  . multivitamin with minerals  1 tablet Oral Daily  . pantoprazole  40 mg Oral BID AC  . piperacillin-tazobactam (ZOSYN)  IV  3.375 g Intravenous Q8H  . polyethylene glycol  17 g Oral Daily  . senna-docusate  1 tablet Oral BID  . tamoxifen  20 mg Oral Daily   Continuous Infusions:    . sodium chloride 100 mL/hr at 06/18/12 0429    Principal Problem:  *Diverticulitis Active Problems:  BREAST CANCER  ASTHMA  GERD  Healthcare-associated pneumonia  ARF (acute renal failure)    Time spent: 35 minutes    [facility] A  [facility] Pager [phone]. If  8PM-8AM, please contact night-coverage at [email or web address], password [facility] 06/18/2012, 1:57 PM  LOS: 4 days

## 2012-06-18 NOTE — Progress Notes (Signed)
PT Cancellation Note  Treatment cancelled today due to medical issues with patient which prohibited therapy. Husband requests PT check back on tomorrow for tx session-pt in agreement. States pt has been nauseous and she just received pain meds prior to PTs arrival.   Rebeca Alert [facility] 06/18/2012, 3:33 PM [phone]

## 2012-06-19 ENCOUNTER — Encounter (HOSPITAL_COMMUNITY): Payer: Self-pay | Admitting: Gastroenterology

## 2012-06-19 LAB — BASIC METABOLIC PANEL
CO2: 23 mEq/L (ref 19–32)
Calcium: 8.3 mg/dL — ABNORMAL LOW (ref 8.4–10.5)
Glucose, Bld: 98 mg/dL (ref 70–99)
Sodium: 141 mEq/L (ref 135–145)

## 2012-06-19 LAB — HEPATIC FUNCTION PANEL
ALT: 10 U/L (ref 0–35)
AST: 20 U/L (ref 0–37)
Bilirubin, Direct: <0.1 mg/dL (ref 0.0–0.3)
Total Bilirubin: 0.3 mg/dL (ref 0.3–1.2)

## 2012-06-19 LAB — CBC
Hemoglobin: 12 g/dL (ref 12.0–15.0)
MCH: 29.1 pg (ref 26.0–34.0)
MCV: 87.4 fL (ref 78.0–100.0)
Platelets: 199 10*3/uL (ref 150–400)
RBC: 4.13 MIL/uL (ref 3.87–5.11)

## 2012-06-19 MED ORDER — POTASSIUM CHLORIDE CRYS ER 20 MEQ PO TBCR
40.0000 meq | EXTENDED_RELEASE_TABLET | Freq: Once | ORAL | Status: AC
Start: 2012-06-19 — End: 2012-06-19
  Administered 2012-06-19: 40 meq via ORAL
  Filled 2012-06-19: qty 2

## 2012-06-19 NOTE — Progress Notes (Signed)
[facility] PROGRESS NOTE  Allison Clark ZOX:[id number] DOB: 19-Apr-1951 DOA: 06/14/2012 PCP: Ezequiel Kayser, MD  Assessment/Plan: Principal Problem:  *Diverticulitis Active Problems:  BREAST CANCER  ASTHMA  GERD  Healthcare-associated pneumonia  ARF (acute renal failure)   Acute diverticulitis -Patient is on Zosyn, currently no fever or chills. -She was making very slow progress, she was advanced to regular diet yesterday.  -Complaining about more nausea and epigastric abdominal pain today. -She has seen Dr. Madilyn Fireman or [facility] before, I asked [facility] to evaluate her and help me with the management of her persistent abdominal pain/diverticulitis.  Acute renal failure -This is likely secondary to dehydration from poor oral intake and vomiting.  -FENa and  is 0.89% indicating it's prerenal acute kidney injury -Renal ultrasound showed evidence of chronic medical renal disease. -Continue IV fluids, check BMP in the morning.  Breast cancer -Stable chronic condition, continue tamoxifen.  Hypothyroidism -stable, chronic condition, continue Synthroid.  Hypertension -Blood pressure stable, but in the low side. -This is likely secondary to the effect of IV narcotics augmenting her blood pressure medicines.  Code Status: Full Family Communication: Husband at bedside, plan explained to both. Disposition Plan: Likely home when she is medical stable.   Brief narrative: 61 year old Caucasian female with past medical history of hypertension and hypothyroidism came in to the hospital because of abdominal pain, CT scan showed diverticulitis. Patient is on Zosyn. She tolerates clear liquid solid mass for liquids today.  Consultants:  None  Procedures:  None  Antibiotics:  Zosyn  HPI/Subjective: Feels much better, tolerating her clear liquids very well. Denies any nausea or vomiting.  Objective: Filed Vitals:   06/18/12 0620 06/18/12 1400 06/18/12 2225 06/19/12 0630    BP: 142/54 147/62 148/81 147/74  Pulse: 85 91 95 93  Temp: 99.4 F (37.4 C) 98.9 F (37.2 C) 98.1 F (36.7 C) 97.9 F (36.6 C)  TempSrc: Oral Oral Oral Oral  Resp: 16 14 16 16   Height:      Weight:      SpO2: 93% 91% 92% 94%    Intake/Output Summary (Last 24 hours) at 06/19/12 1200 Last data filed at 06/19/12 1000  Gross per 24 hour  Intake 1575.83 ml  Output   2800 ml  Net -1224.17 ml   Filed Weights   06/14/12 2100  Weight: 77.1 kg (169 lb 15.6 oz)    Exam: General: Alert and awake, oriented x3, not in any acute distress. HEENT: anicteric sclera, pupils reactive to light and accommodation, EOMI CVS: S1-S2 clear, no murmur rubs or gallops Chest: clear to auscultation bilaterally, no wheezing, rales or rhonchi Abdomen: soft nontender, nondistended, normal bowel sounds, no organomegaly Extremities: no cyanosis, clubbing or edema noted bilaterally Neuro: Cranial nerves II-XII intact, no focal neurological deficits  Data Reviewed: Basic Metabolic Panel:  Lab 06/19/12 8119 06/18/12 0444 06/17/12 0350 06/16/12 0424 06/15/12 0407 06/14/12 2210  NA 141 138 139 137 138 --  K 3.4* 3.5 4.5 3.1* 3.5 --  CL 106 104 105 102 104 --  CO2 23 25 26 28 27  --  GLUCOSE 98 106* 96 103* 105* --  BUN 15 15 16 12 13  --  CREATININE 1.88* 1.92* 1.51* 0.61 0.62 --  CALCIUM 8.3* 8.2* 8.3* 7.9* 8.0* --  MG -- -- -- -- -- 1.9  PHOS -- -- -- -- -- 3.2   Liver Function Tests:  Lab 06/15/12 0407 06/14/12 2210 06/14/12 1402 06/12/12 2020  AST 10 11 15 13   ALT 11  13 14 16   ALKPHOS 37* 38* 43 45  BILITOT 0.3 0.3 0.2* 0.4  PROT 5.6* 5.9* 6.3 6.8  ALBUMIN 2.9* 3.1* 3.4* 3.8    Lab 06/12/12 2020  LIPASE 27  AMYLASE --   No results found for this basename: AMMONIA:5 in the last 168 hours CBC:  Lab 06/19/12 0403 06/18/12 0444 06/17/12 0641 06/16/12 0424 06/15/12 0407 06/14/12 2210 06/14/12 1402  WBC 12.9* 12.1* 14.3* 9.6 13.2* -- --  NEUTROABS -- -- -- -- -- 13.9* 9.4*  HGB 12.0 11.5*  11.6* 11.2* 12.0 -- --  HCT 36.1 34.4* 34.1* 34.0* 35.4* -- --  MCV 87.4 87.8 87.7 88.3 88.3 -- --  PLT 199 217 197 195 209 -- --   Cardiac Enzymes:  Lab 06/14/12 1403  CKTOTAL --  CKMB --  CKMBINDEX --  TROPONINI <0.30   BNP (last 3 results) No results found for this basename: PROBNP:3 in the last 8760 hours CBG:  Lab 06/15/12 0757  GLUCAP 100*    Recent Results (from the past 240 hour(s))  CULTURE, BLOOD (ROUTINE X 2)     Status: Normal (Preliminary result)   Collection Time   06/14/12  9:00 PM      Component Value Range Status Comment   Specimen Description BLOOD R FOREARM   Final    Special Requests BOTTLES DRAWN AEROBIC AND ANAEROBIC 4CC   Final    Culture  Setup Time 06/15/2012 05:38   Final    Culture     Final    Value:        BLOOD CULTURE RECEIVED NO GROWTH TO DATE CULTURE WILL BE HELD FOR 5 DAYS BEFORE ISSUING A FINAL NEGATIVE REPORT   Report Status PENDING   Incomplete   CULTURE, BLOOD (ROUTINE X 2)     Status: Normal (Preliminary result)   Collection Time   06/14/12 10:10 PM      Component Value Range Status Comment   Specimen Description BLOOD R HAND   Final    Special Requests BOTTLES DRAWN AEROBIC ONLY [facility]   Final    Culture  Setup Time 06/15/2012 05:38   Final    Culture     Final    Value:        BLOOD CULTURE RECEIVED NO GROWTH TO DATE CULTURE WILL BE HELD FOR 5 DAYS BEFORE ISSUING A FINAL NEGATIVE REPORT   Report Status PENDING   Incomplete      Studies: Dg Chest 2 View  06/16/2012  *RADIOLOGY REPORT*  Clinical Data: Follow up pneumonia.  No cough for congestion. History breast cancer.  CHEST - 2 VIEW  Comparison: 06/22/2011.  Findings: Left lower lobe opacity is present.  This is in the retrocardiac region, better seen on the lateral view.  Increased density over the lower thoracic spine.  The cardiopericardial silhouette appears within normal limits.  Stable prominence of the ascending aorta on the lateral view.  Basilar atelectasis present on the right.   Small pleural effusion on the left.  Follow-up to ensure radiographic clearing recommended.  Clearing is usually observed at 8 weeks.  IMPRESSION: Left lower lobe opacity.  On prior abdominal CT, this area was visualized and represents an area of atelectasis and / or scarring. Superimposed pneumonia is difficult to exclude but based on the prior CT, pneumonia is unlikely.  Follow-up to ensure resolution is recommended.   Original Report Authenticated By: Andreas Newport, M.D.    Ct Abdomen Pelvis W Contrast  06/14/2012  *RADIOLOGY REPORT*  Clinical Data: Progressive left abdominal pain.  History of diverticulitis.  CT ABDOMEN AND PELVIS WITH CONTRAST  Technique:  Multidetector CT imaging of the abdomen and pelvis was performed following the standard protocol during bolus administration of intravenous contrast.  Contrast: OMNIPAQUE IOHEXOL 300 MG/ML  SOLN  Comparison: Acute abdominal series same date.  Abdominal pelvic CT 06/12/2012.  Findings: There is mildly increased atelectasis at both lung bases. No significant pleural effusion is demonstrated.  Multiple hepatic cysts are again noted.  There is a stable cyst posteriorly in the mid left kidney.  The right kidney appears normal.  The spleen, gallbladder, pancreas and adrenal glands appear normal.  Small splenic artery aneurysms are unchanged. There is no evidence of large vessel occlusion.  Distal transverse colon wall thickening and surrounding inflammatory change are stable.  There is no evidence of bowel obstruction, perforation or extraluminal fluid collection.  The small bowel and appendix appear normal.  The uterus, ovaries and bladder appear normal.  There are no acute osseous findings.  IMPRESSION:  1.  Stable probable inflammatory process involving the distal transverse colon and splenic flexure since CT performed 2 days ago. Again, findings most likely represent diverticulitis, although focal ischemic colitis and underlying mucosal lesion cannot  be excluded. 2.  No evidence of perforation or obstruction.  No evidence of large vessel occlusion. 3.  Stable hepatic and left renal cysts.   Original Report Authenticated By: Gerrianne Scale, M.D.    Ct Abdomen Pelvis W Contrast  06/12/2012  *RADIOLOGY REPORT*  Clinical Data: Lower abdominal pain, nausea, vomiting and elevated white blood cell count.  CT ABDOMEN AND PELVIS WITH CONTRAST  Technique:  Multidetector CT imaging of the abdomen and pelvis was performed following the standard protocol during bolus administration of intravenous contrast.  Contrast: OMNIPAQUE IOHEXOL 300 MG/ML  SOLN  Comparison: None.  Findings: At the level of the distal transverse colon and splenic flexure, focal inflammation and dilatation of the colon is present with a stool ball present measuring approximately 5 cm.  At this level, inflammatory changes are present surrounding the colon and findings most likely reflect acute diverticulitis.  The colon is also thickened at this level and it would be difficult to exclude underlying tumor or stricture by CT.  The colon distal to this level is decompressed.  No evidence of enlarged lymph nodes or free intraperitoneal air. No focal abscess or significant free fluid is identified.  Small bowel loops are normal in caliber.  The liver shows multiple cysts which all appear benign.  The largest in the left lobe measures approximately 4.1 cm and in the right lobe the largest measures 3.6 cm.  There is no evidence of biliary ductal dilatation.  The gallbladder is unremarkable.  The pancreas, spleen, adrenal glands and kidneys are unremarkable. There are focal partially calcified aneurysms involving the distal branches of the splenic artery near the splenic hilum.  The larger measures approximately 8 mm in diameter and a smaller aneurysm measures approximately 4 mm in diameter.  No evidence of rupture.  The bladder is unremarkable.  Uterus and adnexal regions are unremarkable by CT.  No  hernias are identified.  IMPRESSION:  1.  Inflammation of the distal transverse colon and splenic flexure at the level of focal stool accumulation.  The findings likely relate to acute diverticulitis.  However, underlying lesion or stricture of the colon cannot be excluded.  There is no evidence of extraluminal air to suggest overt colonic perforation. 2.  Hepatic cysts which appear benign. 3.  Incidental small splenic artery aneurysms with the largest measuring 8 mm and a smaller aneurysm measuring 4 mm.   Original Report Authenticated By: Reola Calkins, M.D.    Dg Abd Acute W/chest  06/14/2012  *RADIOLOGY REPORT*  Clinical Data: Left lower quadrant abdominal pain, nausea and vomiting.  ACUTE ABDOMEN SERIES (ABDOMEN 2 VIEW & CHEST 1 VIEW)  Comparison: Chest dated 06/22/2011 and abdomen and pelvis CT dated 06/12/2012.  Findings: The cardiac silhouette is borderline enlarged.  Interval linear density at the right lung base and patchy density at the left lung base.  Possible small left pleural effusion.  Normal bowel gas pattern without free peritoneal air.  Diffuse osteopenia.  IMPRESSION:  1.  Patchy opacity at the left lung base concerning for pneumonia. 2.  Minimal right basilar atelectasis. 3.  No acute abdominal abnormality.   Original Report Authenticated By: Darrol Angel, M.D.     Scheduled Meds:    . aspirin EC  81 mg Oral Daily  . beclomethasone  2 puff Inhalation BID  . fluticasone  2 spray Each Nare Daily  . levothyroxine  112 mcg Oral Q0600  . loratadine  10 mg Oral Daily  . montelukast  10 mg Oral Daily  . multivitamin with minerals  1 tablet Oral Daily  . pantoprazole  40 mg Oral BID AC  . piperacillin-tazobactam (ZOSYN)  IV  3.375 g Intravenous Q8H  . polyethylene glycol  17 g Oral Daily  . senna-docusate  1 tablet Oral BID  . tamoxifen  20 mg Oral Daily   Continuous Infusions:    . sodium chloride 150 mL/hr at 06/19/12 2956    Principal Problem:   *Diverticulitis Active Problems:  BREAST CANCER  ASTHMA  GERD  Healthcare-associated pneumonia  ARF (acute renal failure)    Time spent: 35 minutes    [facility]  [facility] Pager [phone]. If 8PM-8AM, please contact night-coverage at [email or web address], password [email or web address] 06/19/2012, 12:00 PM  LOS: 5 days

## 2012-06-19 NOTE — Consult Note (Signed)
Reason for Consult: Changing abdominal pain increased nausea Referring Physician: Hospital team  Allison Clark is an 61 y.o. female.  HPI: Patient known to my partner Dr. Madilyn Fireman with an uneventful screening colonoscopy 5 years ago which interesting enough did not show any diverticular disease who was in her normal state of health till a week ago when she had acute onset of left lower quadrant abdominal pain and was seen in her primary care physician's office and white count was 19 and she was sent to the hospital and initial CT showed questionable diverticulitis versus colitis in probably A. ischemic distribution but she has not seen any blood in her bowels and his CT was repeated 2 days later without obvious change and her left lower quadrant pain was better yesterday but now it changed to left upper quadrant and midepigastric and she's had more nausea and one time vomit today and we are consult for further workup and plans her office chart and hospital chart was reviewed and the most worrisome thing to me is her increased creatinine currently and patient's family history is negative for any significant GI problem and she has no sick contacts and her only abdominal surgery is a tubal ligation and she has no other new complaint  Past Medical History  Diagnosis Date  . Breast cancer   . Thyroid disease   . Asthma   . BBB (bundle branch block)     left  . GERD (gastroesophageal reflux disease)     Past Surgical History  Procedure Date  . Breast surgery   . Tonsillectomy   . Thyroidectomy   . Sterilization   . Tubal ligation     No family history on file.  Social History:  reports that she has never smoked. She does not have any smokeless tobacco history on file. She reports that she does not drink alcohol. Her drug history not on file.  Allergies:  Allergies  Allergen Reactions  . Avelox (Moxifloxacin Hcl In Nacl) Hives  . Sulfonamide Derivatives     REACTION: rash    Medications:  I have reviewed the patient's current medications.  Results for orders placed during the hospital encounter of 06/14/12 (from the past 48 hour(s))  BASIC METABOLIC PANEL     Status: Abnormal   Collection Time   06/18/12  4:44 AM      Component Value Range Comment   Sodium 138  135 - 145 mEq/L    Potassium 3.5  3.5 - 5.1 mEq/L DELTA CHECK NOTED   Chloride 104  96 - 112 mEq/L    CO2 25  19 - 32 mEq/L    Glucose, Bld 106 (*) 70 - 99 mg/dL    BUN 15  6 - 23 mg/dL    Creatinine, Ser 8.29 (*) 0.50 - 1.10 mg/dL    Calcium 8.2 (*) 8.4 - 10.5 mg/dL    GFR calc non Af Amer 27 (*) >90 mL/min    GFR calc Af Amer 31 (*) >90 mL/min   CBC     Status: Abnormal   Collection Time   06/18/12  4:44 AM      Component Value Range Comment   WBC 12.1 (*) 4.0 - 10.5 K/uL    RBC 3.92  3.87 - 5.11 MIL/uL    Hemoglobin 11.5 (*) 12.0 - 15.0 g/dL    HCT 56.2 (*) 13.0 - 46.0 %    MCV 87.8  78.0 - 100.0 fL    MCH 29.3  26.0 -  34.0 pg    MCHC 33.4  30.0 - 36.0 g/dL    RDW 16.1  09.6 - 04.5 %    Platelets 217  150 - 400 K/uL   SODIUM, URINE, RANDOM     Status: Normal   Collection Time   06/18/12  2:44 PM      Component Value Range Comment   Sodium, Ur 32     CREATININE, URINE, RANDOM     Status: Normal   Collection Time   06/18/12  2:44 PM      Component Value Range Comment   Creatinine, Urine 50.9     BASIC METABOLIC PANEL     Status: Abnormal   Collection Time   06/19/12  4:03 AM      Component Value Range Comment   Sodium 141  135 - 145 mEq/L    Potassium 3.4 (*) 3.5 - 5.1 mEq/L    Chloride 106  96 - 112 mEq/L    CO2 23  19 - 32 mEq/L    Glucose, Bld 98  70 - 99 mg/dL    BUN 15  6 - 23 mg/dL    Creatinine, Ser 4.09 (*) 0.50 - 1.10 mg/dL    Calcium 8.3 (*) 8.4 - 10.5 mg/dL    GFR calc non Af Amer 28 (*) >90 mL/min    GFR calc Af Amer 32 (*) >90 mL/min   CBC     Status: Abnormal   Collection Time   06/19/12  4:03 AM      Component Value Range Comment   WBC 12.9 (*) 4.0 - 10.5 K/uL    RBC 4.13  3.87 -  5.11 MIL/uL    Hemoglobin 12.0  12.0 - 15.0 g/dL    HCT 81.1  91.4 - 78.2 %    MCV 87.4  78.0 - 100.0 fL    MCH 29.1  26.0 - 34.0 pg    MCHC 33.2  30.0 - 36.0 g/dL    RDW 95.6  21.3 - 08.6 %    Platelets 199  150 - 400 K/uL   LIPASE, BLOOD     Status: Normal   Collection Time   06/19/12  4:30 AM      Component Value Range Comment   Lipase 28  11 - 59 U/L   HEPATIC FUNCTION PANEL     Status: Abnormal   Collection Time   06/19/12  4:30 AM      Component Value Range Comment   Total Protein 5.5 (*) 6.0 - 8.3 g/dL    Albumin 2.6 (*) 3.5 - 5.2 g/dL    AST 20  0 - 37 U/L    ALT 10  0 - 35 U/L    Alkaline Phosphatase 33 (*) 39 - 117 U/L    Total Bilirubin 0.3  0.3 - 1.2 mg/dL    Bilirubin, Direct <5.7  0.0 - 0.3 mg/dL    Indirect Bilirubin NOT CALCULATED  0.3 - 0.9 mg/dL     US Renal  05/17/6961  *RADIOLOGY REPORT*  Clinical Data: Acute renal failure.  RENAL/URINARY TRACT ULTRASOUND COMPLETE  Comparison:  CT abdomen and pelvis 06/14/2012.  Findings:  Right Kidney:  Measures 13.0 cm.  There is some increased cortical echogenicity.  No stone, mass or hydronephrosis.  Tiny amount of fluid off the inferior pole is noted.  Left Kidney:  Measures 14.5 cm.  Cortical echogenicity is somewhat increased.  No stone, mass or hydronephrosis.  Bladder:  Unremarkable.  IMPRESSION: Findings  compatible with medical renal disease.  Negative for hydronephrosis.   Original Report Authenticated By: Bernadene Bell. D'ALESSIO, M.D.     ROS negative except above although she does say that even walking to the bathroom hurts her belly Blood pressure 157/60, pulse 79, temperature 98.2 F (36.8 C), temperature source Oral, resp. rate 18, height 5' 4.17" (1.63 m), weight 77.1 kg (169 lb 15.6 oz), SpO2 94.00%. Physical Exam vital signs stable no acute distress however mildly sickly appearing with exam pertinent for abdomen being soft good bowel sounds mild to moderate midepigastric and left upper quadrant discomfort without  tenderness elsewhere and no rebound and adequate peripheral pulses labs most worrisome for an increased creatinine WBC stable at 12 liver tests lipase normal CT reviewed  Assessment/Plan: Changing abdominal pain increased nausea questionable etiology Plan: Will get a gallbladder ultrasound to make sure that's not playing a role with her current symptoms and you might ask the kidney team to see her regarding her creatinine which might be contrast-induced and otherwise I will last my partner to see tomorrow and consider either endoscopy or colonoscopy to confirm ischemia and in the meantime continue clear liquids only  Augustine Leverette E 06/19/2012, 3:33 PM

## 2012-06-19 NOTE — Progress Notes (Signed)
PT Cancellation Note  Treatment cancelled today due to medical issues with patient which prohibited therapy- husband states pt is still nauseous and "out of it right now". Will check back another day to attempt PT tx session. Thanks.  Rebeca Alert [facility] 06/19/2012, 10:32 AM [phone]

## 2012-06-19 NOTE — Progress Notes (Signed)
ANTIBIOTIC CONSULT NOTE - FOLLOW UP  Pharmacy Consult for Zosyn Indication: Diverticulitis  Allergies  Allergen Reactions  . Avelox (Moxifloxacin Hcl In Nacl) Hives  . Sulfonamide Derivatives     REACTION: rash    Patient Measurements: Height: 5' 4.17" (163 cm) Weight: 169 lb 15.6 oz (77.1 kg) IBW/kg (Calculated) : 55.1   Vital Signs: Temp: 97.9 F (36.6 C) (09/06 0630) Temp src: Oral (09/06 0630) BP: 147/74 mmHg (09/06 0630) Pulse Rate: 93  (09/06 0630) Intake/Output from previous day: 09/05 0701 - 09/06 0700 In: 2704.2 [P.O.:1200; I.V.:1404.2; IV Piggyback:100] Out: 2500 [Urine:2500] Intake/Output from this shift: Total I/O In: -  Out: 900 [Urine:900]  Labs:  [facility] 06/19/12 0403 06/18/12 1444 06/18/12 0444 06/17/12 0641 06/17/12 0350  WBC 12.9* -- 12.1* 14.3* --  HGB 12.0 -- 11.5* 11.6* --  PLT 199 -- 217 197 --  LABCREA -- 50.9 -- -- --  CREATININE 1.88* -- 1.92* -- 1.51*   Estimated Creatinine Clearance: 31.7 ml/min (by C-G formula based on Cr of 1.88).   Microbiology: Recent Results (from the past 720 hour(s))  CULTURE, BLOOD (ROUTINE X 2)     Status: Normal (Preliminary result)   Collection Time   06/14/12  9:00 PM      Component Value Range Status Comment   Specimen Description BLOOD R FOREARM   Final    Special Requests BOTTLES DRAWN AEROBIC AND ANAEROBIC 4CC   Final    Culture  Setup Time 06/15/2012 05:38   Final    Culture     Final    Value:        BLOOD CULTURE RECEIVED NO GROWTH TO DATE CULTURE WILL BE HELD FOR 5 DAYS BEFORE ISSUING A FINAL NEGATIVE REPORT   Report Status PENDING   Incomplete   CULTURE, BLOOD (ROUTINE X 2)     Status: Normal (Preliminary result)   Collection Time   06/14/12 10:10 PM      Component Value Range Status Comment   Specimen Description BLOOD R HAND   Final    Special Requests BOTTLES DRAWN AEROBIC ONLY 9CC   Final    Culture  Setup Time 06/15/2012 05:38   Final    Culture     Final    Value:        BLOOD CULTURE  RECEIVED NO GROWTH TO DATE CULTURE WILL BE HELD FOR 5 DAYS BEFORE ISSUING A FINAL NEGATIVE REPORT   Report Status PENDING   Incomplete     Anti-infectives     Start     Dose/Rate Route Frequency Ordered Stop   06/16/12 1700   vancomycin (VANCOCIN) IVPB 1000 mg/200 mL premix        1,000 mg 200 mL/hr over 60 Minutes Intravenous Every 12 hours 06/16/12 1124 06/17/12 0102   06/16/12 1400   piperacillin-tazobactam (ZOSYN) IVPB 3.375 g        3.375 g 12.5 mL/hr over 240 Minutes Intravenous 3 times per day 06/16/12 1123     06/15/12 0000   piperacillin-tazobactam (ZOSYN) IVPB 3.375 g  Status:  Discontinued        3.375 g 12.5 mL/hr over 240 Minutes Intravenous 3 times per day 06/14/12 2109 06/16/12 1050   06/14/12 2200   metroNIDAZOLE (FLAGYL) IVPB 500 mg  Status:  Discontinued        500 mg 100 mL/hr over 60 Minutes Intravenous Every 8 hours 06/14/12 2116 06/15/12 0941   06/14/12 2145   metroNIDAZOLE (FLAGYL) IVPB 500 mg  Status:  Discontinued        500 mg 100 mL/hr over 60 Minutes Intravenous Every 8 hours 06/14/12 2134 06/14/12 2140   06/14/12 2130   vancomycin (VANCOCIN) IVPB 1000 mg/200 mL premix  Status:  Discontinued        1,000 mg 200 mL/hr over 60 Minutes Intravenous Every 8 hours 06/14/12 2050 06/14/12 2111   06/14/12 2115   vancomycin (VANCOCIN) IVPB 1000 mg/200 mL premix  Status:  Discontinued        1,000 mg 200 mL/hr over 60 Minutes Intravenous Every 12 hours 06/14/12 2111 06/16/12 1050   06/14/12 1545   vancomycin (VANCOCIN) IVPB 1000 mg/200 mL premix        1,000 mg 200 mL/hr over 60 Minutes Intravenous  Once 06/14/12 1542 06/14/12 1817   06/14/12 1500   piperacillin-tazobactam (ZOSYN) IVPB 3.375 g        3.375 g 100 mL/hr over 30 Minutes Intravenous To Emergency Dept 06/14/12 1407 06/14/12 1641   06/14/12 1415   metroNIDAZOLE (FLAGYL) IVPB 500 mg        500 mg 100 mL/hr over 60 Minutes Intravenous  Once 06/14/12 1402 06/14/12 1518   06/14/12 1415    piperacillin-tazobactam (ZOSYN) IVPB 3.375 g  Status:  Discontinued        3.375 g 12.5 mL/hr over 240 Minutes Intravenous  Once 06/14/12 1402 06/14/12 1406          Assessment:  61 YOF with diverticulitis, on day #6 Zosyn; Vancomycin d/c 9/3 since PNA ruled out.  Scr has remains elevated, but appears to be leveling off  Zosyn dose remains appropriate for renal fxn  Goal of Therapy:  Appropriate renal dosing Eradication of infection  Plan:   Continue Zosyn 3.375gm IV q8h (4hr extended infusions)  Follow Scr closely, will need to adjust dose if CrCl falls < 20 ml/min  Follow up cultures  Darrol Angel, PharmD Pager: [phone] 06/19/2012 10:57 AM

## 2012-06-19 NOTE — Care Management Note (Signed)
    Page 1 of 1   06/22/2012     1:52:31 PM   CARE MANAGEMENT NOTE 06/22/2012  Patient:  Allison Clark, Allison Clark   Account Number:  [id number]  Date Initiated:  06/19/2012  Documentation initiated by:  Lorenda Ishihara  Subjective/Objective Assessment:   61 yo female admitted with diverticulitis, PNA. PTA lived at home with spouse.     Action/Plan:   Anticipated DC Date:  06/22/2012   Anticipated DC Plan:  HOME/SELF CARE      DC Planning Services  CM consult      Choice offered to / List presented to:             Status of service:  Completed, signed off Medicare Important Message given?   (If response is "NO", the following Medicare IM given date fields will be blank) Date Medicare IM given:   Date Additional Medicare IM given:    Discharge Disposition:  HOME/SELF CARE  Per UR Regulation:  Reviewed for med. necessity/level of care/duration of stay  If discussed at Long Length of Stay Meetings, dates discussed:    Comments:

## 2012-06-20 ENCOUNTER — Inpatient Hospital Stay (HOSPITAL_COMMUNITY): Payer: Managed Care, Other (non HMO)

## 2012-06-20 LAB — BASIC METABOLIC PANEL
BUN: 15 mg/dL (ref 6–23)
Calcium: 7.6 mg/dL — ABNORMAL LOW (ref 8.4–10.5)
Creatinine, Ser: 1.71 mg/dL — ABNORMAL HIGH (ref 0.50–1.10)
GFR calc Af Amer: 36 mL/min — ABNORMAL LOW (ref >90)
GFR calc non Af Amer: 31 mL/min — ABNORMAL LOW (ref >90)

## 2012-06-20 LAB — CBC
MCHC: 33.6 g/dL (ref 30.0–36.0)
RDW: 13.2 % (ref 11.5–15.5)

## 2012-06-20 LAB — CLOSTRIDIUM DIFFICILE BY PCR: Toxigenic C. Difficile by PCR: POSITIVE — AB

## 2012-06-20 MED ORDER — ONDANSETRON HCL 4 MG/2ML IJ SOLN
4.0000 mg | Freq: Four times a day (QID) | INTRAMUSCULAR | Status: DC | PRN
  Administered 2012-06-20 – 2012-06-22 (×2): 4 mg via INTRAVENOUS
  Filled 2012-06-20 (×2): qty 2

## 2012-06-20 MED ORDER — POTASSIUM CHLORIDE CRYS ER 20 MEQ PO TBCR
40.0000 meq | EXTENDED_RELEASE_TABLET | Freq: Four times a day (QID) | ORAL | Status: AC
  Administered 2012-06-20: 20 meq via ORAL
  Filled 2012-06-20 (×2): qty 2

## 2012-06-20 MED ORDER — SACCHAROMYCES BOULARDII 250 MG PO CAPS
250.0000 mg | ORAL_CAPSULE | Freq: Two times a day (BID) | ORAL | Status: DC
  Administered 2012-06-20 – 2012-06-21 (×4): 250 mg via ORAL
  Filled 2012-06-20 (×6): qty 1

## 2012-06-20 MED ORDER — METRONIDAZOLE 500 MG PO TABS
500.0000 mg | ORAL_TABLET | Freq: Three times a day (TID) | ORAL | Status: DC
  Administered 2012-06-20 – 2012-06-22 (×5): 500 mg via ORAL
  Filled 2012-06-20 (×8): qty 1

## 2012-06-20 NOTE — Progress Notes (Signed)
Notified Allison Mcgregor NP about pt with positive C.Diff. Discussed pt's antibiotics and refusal of potassium. Orders received.

## 2012-06-20 NOTE — Progress Notes (Signed)
[facility] PROGRESS NOTE  Allison Clark NWG:[id number] DOB: 1951/05/29 DOA: 06/14/2012 PCP: Ezequiel Kayser, MD  Assessment/Plan: Principal Problem:  *Diverticulitis Active Problems:  BREAST CANCER  ASTHMA  GERD  Healthcare-associated pneumonia  ARF (acute renal failure)   Acute diverticulitis -Patient is on Zosyn, currently no fever or chills. -Appreciate Dr. Dulce Sellar help, continue to treat the diverticulitis with Zosyn. -Obtain C. difficile PCR, continue clear liquids for now. Ultrasound shows no gallbladder disease.  Acute renal failure -This is likely secondary to dehydration from poor oral intake and vomiting, this is probably exacerbated by the contrast medium patient she got for 2 CT scans was within 1 week.  -FENa and  is 0.89% indicating it's prerenal acute kidney injury -Renal ultrasound showed evidence of chronic medical renal disease. -Continue IV fluids, creatinine is improving slowly  Breast cancer -Stable chronic condition, continue tamoxifen.  Hypothyroidism -stable, chronic condition, continue Synthroid.  Hypertension -Blood pressure stable, but in the low side. -This is likely secondary to the effect of IV narcotics augmenting her blood pressure medicines.  Code Status: Full Family Communication: Husband at bedside, plan explained to both. Disposition Plan: Likely home when she is medical stable.   Brief narrative: 61 year old Caucasian female with past medical history of hypertension and hypothyroidism came in to the hospital because of abdominal pain, CT scan showed diverticulitis. Patient is on Zosyn. She tolerates clear liquid solid mass for liquids today.  Consultants:  None  Procedures:  None  Antibiotics:  Zosyn  HPI/Subjective: Feels much better, tolerating her clear liquids very well. Denies any nausea or vomiting.  Objective: Filed Vitals:   06/19/12 2143 06/20/12 0613 06/20/12 1004 06/20/12 1422  BP: 156/62 141/55 159/72  157/76  Pulse: 87 82 81 79  Temp: 99 F (37.2 C) 98.6 F (37 C) 99 F (37.2 C) 97.6 F (36.4 C)  TempSrc: Oral Oral Oral Oral  Resp: 18 18 20 18   Height:      Weight:      SpO2: 92% 92% 92% 93%    Intake/Output Summary (Last 24 hours) at 06/20/12 1514 Last data filed at 06/20/12 1347  Gross per 24 hour  Intake 3457.5 ml  Output   1900 ml  Net 1557.5 ml   Filed Weights   06/14/12 2100  Weight: 77.1 kg (169 lb 15.6 oz)    Exam: General: Alert and awake, oriented x3, not in any acute distress. HEENT: anicteric sclera, pupils reactive to light and accommodation, EOMI CVS: S1-S2 clear, no murmur rubs or gallops Chest: clear to auscultation bilaterally, no wheezing, rales or rhonchi Abdomen: soft nontender, nondistended, normal bowel sounds, no organomegaly Extremities: no cyanosis, clubbing or edema noted bilaterally Neuro: Cranial nerves II-XII intact, no focal neurological deficits  Data Reviewed: Basic Metabolic Panel:  Lab 06/20/12 5784 06/19/12 0403 06/18/12 0444 06/17/12 0350 06/16/12 0424 06/14/12 2210  NA 144 141 138 139 137 --  K 3.2* 3.4* 3.5 4.5 3.1* --  CL 110 106 104 105 102 --  CO2 21 23 25 26 28  --  GLUCOSE 89 98 106* 96 103* --  BUN 15 15 15 16 12  --  CREATININE 1.71* 1.88* 1.92* 1.51* 0.61 --  CALCIUM 7.6* 8.3* 8.2* 8.3* 7.9* --  MG -- -- -- -- -- 1.9  PHOS -- -- -- -- -- 3.2   Liver Function Tests:  Lab 06/19/12 0430 06/15/12 0407 06/14/12 2210 06/14/12 1402  AST 20 10 11 15   ALT 10 11 13 14   ALKPHOS 33* 37* 38*  43  BILITOT 0.3 0.3 0.3 0.2*  PROT 5.5* 5.6* 5.9* 6.3  ALBUMIN 2.6* 2.9* 3.1* 3.4*    Lab 06/19/12 0430  LIPASE 28  AMYLASE --   No results found for this basename: AMMONIA:5 in the last 168 hours CBC:  Lab 06/20/12 0430 06/19/12 0403 06/18/12 0444 06/17/12 0641 06/16/12 0424 06/14/12 2210 06/14/12 1402  WBC 12.6* 12.9* 12.1* 14.3* 9.6 -- --  NEUTROABS -- -- -- -- -- 13.9* 9.4*  HGB 10.2* 12.0 11.5* 11.6* 11.2* -- --  HCT  30.4* 36.1 34.4* 34.1* 34.0* -- --  MCV 87.1 87.4 87.8 87.7 88.3 -- --  PLT 202 199 217 197 195 -- --   Cardiac Enzymes:  Lab 06/14/12 1403  CKTOTAL --  CKMB --  CKMBINDEX --  TROPONINI <0.30   BNP (last 3 results) No results found for this basename: PROBNP:3 in the last 8760 hours CBG:  Lab 06/15/12 0757  GLUCAP 100*    Recent Results (from the past 240 hour(s))  CULTURE, BLOOD (ROUTINE X 2)     Status: Normal (Preliminary result)   Collection Time   06/14/12  9:00 PM      Component Value Range Status Comment   Specimen Description BLOOD R FOREARM   Final    Special Requests BOTTLES DRAWN AEROBIC AND ANAEROBIC 4CC   Final    Culture  Setup Time 06/15/2012 05:38   Final    Culture     Final    Value:        BLOOD CULTURE RECEIVED NO GROWTH TO DATE CULTURE WILL BE HELD FOR 5 DAYS BEFORE ISSUING A FINAL NEGATIVE REPORT   Report Status PENDING   Incomplete   CULTURE, BLOOD (ROUTINE X 2)     Status: Normal (Preliminary result)   Collection Time   06/14/12 10:10 PM      Component Value Range Status Comment   Specimen Description BLOOD R HAND   Final    Special Requests BOTTLES DRAWN AEROBIC ONLY [facility]   Final    Culture  Setup Time 06/15/2012 05:38   Final    Culture     Final    Value:        BLOOD CULTURE RECEIVED NO GROWTH TO DATE CULTURE WILL BE HELD FOR 5 DAYS BEFORE ISSUING A FINAL NEGATIVE REPORT   Report Status PENDING   Incomplete      Studies: Dg Chest 2 View  06/16/2012  *RADIOLOGY REPORT*  Clinical Data: Follow up pneumonia.  No cough for congestion. History breast cancer.  CHEST - 2 VIEW  Comparison: 06/22/2011.  Findings: Left lower lobe opacity is present.  This is in the retrocardiac region, better seen on the lateral view.  Increased density over the lower thoracic spine.  The cardiopericardial silhouette appears within normal limits.  Stable prominence of the ascending aorta on the lateral view.  Basilar atelectasis present on the right.  Small pleural effusion on  the left.  Follow-up to ensure radiographic clearing recommended.  Clearing is usually observed at 8 weeks.  IMPRESSION: Left lower lobe opacity.  On prior abdominal CT, this area was visualized and represents an area of atelectasis and / or scarring. Superimposed pneumonia is difficult to exclude but based on the prior CT, pneumonia is unlikely.  Follow-up to ensure resolution is recommended.   Original Report Authenticated By: Andreas Newport, M.D.    Ct Abdomen Pelvis W Contrast  06/14/2012  *RADIOLOGY REPORT*  Clinical Data: Progressive left abdominal pain.  History of diverticulitis.  CT ABDOMEN AND PELVIS WITH CONTRAST  Technique:  Multidetector CT imaging of the abdomen and pelvis was performed following the standard protocol during bolus administration of intravenous contrast.  Contrast: OMNIPAQUE IOHEXOL 300 MG/ML  SOLN  Comparison: Acute abdominal series same date.  Abdominal pelvic CT 06/12/2012.  Findings: There is mildly increased atelectasis at both lung bases. No significant pleural effusion is demonstrated.  Multiple hepatic cysts are again noted.  There is a stable cyst posteriorly in the mid left kidney.  The right kidney appears normal.  The spleen, gallbladder, pancreas and adrenal glands appear normal.  Small splenic artery aneurysms are unchanged. There is no evidence of large vessel occlusion.  Distal transverse colon wall thickening and surrounding inflammatory change are stable.  There is no evidence of bowel obstruction, perforation or extraluminal fluid collection.  The small bowel and appendix appear normal.  The uterus, ovaries and bladder appear normal.  There are no acute osseous findings.  IMPRESSION:  1.  Stable probable inflammatory process involving the distal transverse colon and splenic flexure since CT performed 2 days ago. Again, findings most likely represent diverticulitis, although focal ischemic colitis and underlying mucosal lesion cannot be excluded. 2.  No  evidence of perforation or obstruction.  No evidence of large vessel occlusion. 3.  Stable hepatic and left renal cysts.   Original Report Authenticated By: Gerrianne Scale, M.D.    Ct Abdomen Pelvis W Contrast  06/12/2012  *RADIOLOGY REPORT*  Clinical Data: Lower abdominal pain, nausea, vomiting and elevated white blood cell count.  CT ABDOMEN AND PELVIS WITH CONTRAST  Technique:  Multidetector CT imaging of the abdomen and pelvis was performed following the standard protocol during bolus administration of intravenous contrast.  Contrast: OMNIPAQUE IOHEXOL 300 MG/ML  SOLN  Comparison: None.  Findings: At the level of the distal transverse colon and splenic flexure, focal inflammation and dilatation of the colon is present with a stool ball present measuring approximately 5 cm.  At this level, inflammatory changes are present surrounding the colon and findings most likely reflect acute diverticulitis.  The colon is also thickened at this level and it would be difficult to exclude underlying tumor or stricture by CT.  The colon distal to this level is decompressed.  No evidence of enlarged lymph nodes or free intraperitoneal air. No focal abscess or significant free fluid is identified.  Small bowel loops are normal in caliber.  The liver shows multiple cysts which all appear benign.  The largest in the left lobe measures approximately 4.1 cm and in the right lobe the largest measures 3.6 cm.  There is no evidence of biliary ductal dilatation.  The gallbladder is unremarkable.  The pancreas, spleen, adrenal glands and kidneys are unremarkable. There are focal partially calcified aneurysms involving the distal branches of the splenic artery near the splenic hilum.  The larger measures approximately 8 mm in diameter and a smaller aneurysm measures approximately 4 mm in diameter.  No evidence of rupture.  The bladder is unremarkable.  Uterus and adnexal regions are unremarkable by CT.  No hernias are  identified.  IMPRESSION:  1.  Inflammation of the distal transverse colon and splenic flexure at the level of focal stool accumulation.  The findings likely relate to acute diverticulitis.  However, underlying lesion or stricture of the colon cannot be excluded.  There is no evidence of extraluminal air to suggest overt colonic perforation. 2.  Hepatic cysts which appear benign. 3.  Incidental small splenic artery aneurysms with the largest measuring 8 mm and a smaller aneurysm measuring 4 mm.   Original Report Authenticated By: Reola Calkins, M.D.    Dg Abd Acute W/chest  06/14/2012  *RADIOLOGY REPORT*  Clinical Data: Left lower quadrant abdominal pain, nausea and vomiting.  ACUTE ABDOMEN SERIES (ABDOMEN 2 VIEW & CHEST 1 VIEW)  Comparison: Chest dated 06/22/2011 and abdomen and pelvis CT dated 06/12/2012.  Findings: The cardiac silhouette is borderline enlarged.  Interval linear density at the right lung base and patchy density at the left lung base.  Possible small left pleural effusion.  Normal bowel gas pattern without free peritoneal air.  Diffuse osteopenia.  IMPRESSION:  1.  Patchy opacity at the left lung base concerning for pneumonia. 2.  Minimal right basilar atelectasis. 3.  No acute abdominal abnormality.   Original Report Authenticated By: Darrol Angel, M.D.     Scheduled Meds:    . aspirin EC  81 mg Oral Daily  . beclomethasone  2 puff Inhalation BID  . fluticasone  2 spray Each Nare Daily  . levothyroxine  112 mcg Oral Q0600  . loratadine  10 mg Oral Daily  . montelukast  10 mg Oral Daily  . multivitamin with minerals  1 tablet Oral Daily  . pantoprazole  40 mg Oral BID AC  . piperacillin-tazobactam (ZOSYN)  IV  3.375 g Intravenous Q8H  . potassium chloride  40 mEq Oral Once  . potassium chloride  40 mEq Oral Q6H  . saccharomyces boulardii  250 mg Oral BID  . tamoxifen  20 mg Oral Daily  . DISCONTD: polyethylene glycol  17 g Oral Daily  . DISCONTD: senna-docusate  1 tablet  Oral BID   Continuous Infusions:    . sodium chloride 75 mL/hr at 06/20/12 1151    Principal Problem:  *Diverticulitis Active Problems:  BREAST CANCER  ASTHMA  GERD  Healthcare-associated pneumonia  ARF (acute renal failure)    Time spent: 35 minutes    [facility]  [facility] Pager [phone]. If 8PM-8AM, please contact night-coverage at [email or web address], password [facility] 06/20/2012, 3:14 PM  LOS: 6 days

## 2012-06-20 NOTE — Progress Notes (Signed)
Subjective: Nausea and generalized abdominal pain present, but improving. Having progressive diarrhea. Is hungry.  Objective: Vital signs in last 24 hours: Temp:  [98.2 F (36.8 C)-99 F (37.2 C)] 99 F (37.2 C) (09/07 1004) Pulse Rate:  [79-87] 81  (09/07 1004) Resp:  [18-20] 20  (09/07 1004) BP: (141-159)/(55-72) 159/72 mmHg (09/07 1004) SpO2:  [92 %-94 %] 92 % (09/07 1004) Weight change:  Last BM Date: 06/18/12  PE: GEN:  Weak and generally fatigued-appearing but is not acutely toxic HEENT:  Somewhat dry mucous membranes ABD:  Soft, somewhat hyperactive bowel sounds, mild diffuse tenderness without peritonitis EXT:  Some moderate generalized edema  Studies/Results: U/S:  Report pending, but I don't see any obvious gallstones, gallbladder inflammatory changes or biliary ductal dilatation  Labs: CBC    Component Value Date/Time   WBC 12.6* 06/20/2012 0430   WBC 9.6 01/28/2012 1421   RBC 3.49* 06/20/2012 0430   RBC 4.43 01/28/2012 1421   HGB 10.2* 06/20/2012 0430   HGB 13.1 01/28/2012 1421   HCT 30.4* 06/20/2012 0430   HCT 39.5 01/28/2012 1421   PLT 202 06/20/2012 0430   PLT 218 01/28/2012 1421   MCV 87.1 06/20/2012 0430   MCV 89.2 01/28/2012 1421   MCH 29.2 06/20/2012 0430   MCH 29.7 01/28/2012 1421   MCHC 33.6 06/20/2012 0430   MCHC 33.3 01/28/2012 1421   RDW 13.2 06/20/2012 0430   RDW 13.5 01/28/2012 1421   LYMPHSABS 1.9 06/14/2012 2210   LYMPHSABS 2.6 01/28/2012 1421   MONOABS 0.9 06/14/2012 2210   MONOABS 0.8 01/28/2012 1421   EOSABS 0.1 06/14/2012 2210   EOSABS 0.2 01/28/2012 1421   BASOSABS 0.0 06/14/2012 2210   BASOSABS 0.1 01/28/2012 1421   CMP     Component Value Date/Time   NA 144 06/20/2012 0430   K 3.2* 06/20/2012 0430   CL 110 06/20/2012 0430   CO2 21 06/20/2012 0430   GLUCOSE 89 06/20/2012 0430   BUN 15 06/20/2012 0430   CREATININE 1.71* 06/20/2012 0430   CALCIUM 7.6* 06/20/2012 0430   PROT 5.5* 06/19/2012 0430   ALBUMIN 2.6* 06/19/2012 0430   AST 20 06/19/2012 0430   ALT 10 06/19/2012 0430     ALKPHOS 33* 06/19/2012 0430   BILITOT 0.3 06/19/2012 0430   GFRNONAA 31* 06/20/2012 0430   GFRAA 36* 06/20/2012 0430   Assessment:  1.  Migratory abdominal pain.  CT compatible with diverticulitis > ischemic colitis.  Slowly and progressively improving. 2.  Diarrhea.  Suspect antibiotic-associated, C diff can't be ruled out. 3.  Nausea.  Likely secondary to colonic inflammatory process.  U/S report pending, but I don't detect any obvious gallbladder process. 4.  Acute renal failure and leukocytosis, slowly progressively improving.  Plan:  1.  Gingerly resume diet; start with clear liquids today. 2.  Slowly cut back on IV fluids. 3.  C. Diff PCR. 4.  Ambulate, OOBTC as tolerated. 5.  Await final U/S results. 6.  Continue antibiotics, antiemetics, judicious analgesics. 7.  Add probiotics.   Freddy Jaksch 06/20/2012, 10:39 AM

## 2012-06-20 NOTE — Progress Notes (Signed)
Dr. Arthor Captain notified pt c/o nausea this pm about 1 to 2 hours after Zosyn hung. Pt and husband expressed concern. No change noted in swelling of hands or feet/ankles. See new orders received in EPIC.

## 2012-06-21 DIAGNOSIS — A0472 Enterocolitis due to Clostridium difficile, not specified as recurrent: Secondary | ICD-10-CM

## 2012-06-21 HISTORY — DX: Enterocolitis due to Clostridium difficile, not specified as recurrent: A04.72

## 2012-06-21 LAB — CULTURE, BLOOD (ROUTINE X 2): Culture: NO GROWTH

## 2012-06-21 LAB — BASIC METABOLIC PANEL
Calcium: 7.8 mg/dL — ABNORMAL LOW (ref 8.4–10.5)
GFR calc non Af Amer: 36 mL/min — ABNORMAL LOW (ref >90)
Sodium: 142 mEq/L (ref 135–145)

## 2012-06-21 LAB — CBC
MCH: 29 pg (ref 26.0–34.0)
Platelets: 200 10*3/uL (ref 150–400)
RBC: 3.45 MIL/uL — ABNORMAL LOW (ref 3.87–5.11)
RDW: 13 % (ref 11.5–15.5)
WBC: 11 10*3/uL — ABNORMAL HIGH (ref 4.0–10.5)

## 2012-06-21 MED ORDER — HYDRALAZINE HCL 20 MG/ML IJ SOLN
10.0000 mg | Freq: Once | INTRAMUSCULAR | Status: AC
  Administered 2012-06-22: 10 mg via INTRAVENOUS
  Filled 2012-06-21: qty 1

## 2012-06-21 MED ORDER — POTASSIUM CHLORIDE CRYS ER 20 MEQ PO TBCR
40.0000 meq | EXTENDED_RELEASE_TABLET | Freq: Four times a day (QID) | ORAL | Status: AC
  Administered 2012-06-21 (×2): 40 meq via ORAL
  Filled 2012-06-21 (×2): qty 2

## 2012-06-21 NOTE — Progress Notes (Signed)
[facility] PROGRESS NOTE  RONNETT PULLIN ZOX:[id number] DOB: [birth year] DOA: 06/14/2012 PCP: Ezequiel Kayser, MD  Assessment/Plan: Principal Problem:  *Diverticulitis Active Problems:  BREAST CANCER  ASTHMA  GERD  Healthcare-associated pneumonia  ARF (acute renal failure)   C. difficile colitis -Started on Flagyl, Zosyn discontinued. -Appreciate GI input, Florastor for 6 weeks. -Advance diet as tolerated, probably to soft diet today.  Acute renal failure -This is likely secondary to dehydration from poor oral intake and vomiting, this is probably exacerbated by the contrast medium patient she got for 2 CT scans was within 1 week.  -FENa and  is 0.89% indicating it's prerenal acute kidney injury -Renal ultrasound showed evidence of chronic medical renal disease. -I discontinued the IV fluids because of generalized swelling, creatinine continued to improve slowly.  Breast cancer -Stable chronic condition, continue tamoxifen.  Hypothyroidism -stable, chronic condition, continue Synthroid.  Hypertension -Blood pressure stable, but in the low side. -This is likely secondary to the effect of IV narcotics augmenting her blood pressure medicines.  Code Status: Full Family Communication: Husband at bedside, plan explained to both. Disposition Plan: Likely home when she is medical stable.   Brief narrative: 61 year old Caucasian female with past medical history of hypertension and hypothyroidism came in to the hospital because of abdominal pain, CT scan showed diverticulitis. Patient is on Zosyn. She tolerates clear liquid solid mass for liquids today.  Consultants:  None  Procedures:  None  Antibiotics:  Flagyl  HPI/Subjective: Feels much better, tolerating her clear liquids very well. Denies any nausea or vomiting.  Objective: Filed Vitals:   06/20/12 1422 06/20/12 1800 06/20/12 2204 06/21/12 0430  BP: 157/76 178/77 159/74 159/71  Pulse: 79 78 77 77  Temp:  97.6 F (36.4 C) 99.1 F (37.3 C) 98.7 F (37.1 C) 98.4 F (36.9 C)  TempSrc: Oral Oral Oral Oral  Resp: 18 16 20 18   Height:      Weight:      SpO2: 93% 93% 92% 90%    Intake/Output Summary (Last 24 hours) at 06/21/12 1228 Last data filed at 06/21/12 0915  Gross per 24 hour  Intake 623.17 ml  Output   1550 ml  Net -926.83 ml   Filed Weights   06/14/12 2100  Weight: 77.1 kg (169 lb 15.6 oz)    Exam: General: Alert and awake, oriented x3, not in any acute distress. HEENT: anicteric sclera, pupils reactive to light and accommodation, EOMI CVS: S1-S2 clear, no murmur rubs or gallops Chest: clear to auscultation bilaterally, no wheezing, rales or rhonchi Abdomen: soft nontender, nondistended, normal bowel sounds, no organomegaly Extremities: no cyanosis, clubbing or edema noted bilaterally Neuro: Cranial nerves II-XII intact, no focal neurological deficits  Data Reviewed: Basic Metabolic Panel:  Lab 06/21/12 8119 06/20/12 0430 06/19/12 0403 06/18/12 0444 06/17/12 0350 06/14/12 2210  NA 142 144 141 138 139 --  K 3.0* 3.2* 3.4* 3.5 4.5 --  CL 109 110 106 104 105 --  CO2 22 21 23 25 26  --  GLUCOSE 92 89 98 106* 96 --  BUN 16 15 15 15 16  --  CREATININE 1.53* 1.71* 1.88* 1.92* 1.51* --  CALCIUM 7.8* 7.6* 8.3* 8.2* 8.3* --  MG -- -- -- -- -- 1.9  PHOS -- -- -- -- -- 3.2   Liver Function Tests:  Lab 06/19/12 0430 06/15/12 0407 06/14/12 2210 06/14/12 1402  AST 20 10 11 15   ALT 10 11 13 14   ALKPHOS 33* 37* 38* 43  BILITOT 0.3 0.3  0.3 0.2*  PROT 5.5* 5.6* 5.9* 6.3  ALBUMIN 2.6* 2.9* 3.1* 3.4*    Lab 06/19/12 0430  LIPASE 28  AMYLASE --   No results found for this basename: AMMONIA:5 in the last 168 hours CBC:  Lab 06/21/12 0438 06/20/12 0430 06/19/12 0403 06/18/12 0444 06/17/12 0641 06/14/12 2210 06/14/12 1402  WBC 11.0* 12.6* 12.9* 12.1* 14.3* -- --  NEUTROABS -- -- -- -- -- 13.9* 9.4*  HGB 10.0* 10.2* 12.0 11.5* 11.6* -- --  HCT 29.8* 30.4* 36.1 34.4* 34.1*  -- --  MCV 86.4 87.1 87.4 87.8 87.7 -- --  PLT 200 202 199 217 197 -- --   Cardiac Enzymes:  Lab 06/14/12 1403  CKTOTAL --  CKMB --  CKMBINDEX --  TROPONINI <0.30   BNP (last 3 results) No results found for this basename: PROBNP:3 in the last 8760 hours CBG:  Lab 06/15/12 0757  GLUCAP 100*    Recent Results (from the past 240 hour(s))  CULTURE, BLOOD (ROUTINE X 2)     Status: Normal   Collection Time   06/14/12  9:00 PM      Component Value Range Status Comment   Specimen Description BLOOD R FOREARM   Final    Special Requests BOTTLES DRAWN AEROBIC AND ANAEROBIC 4CC   Final    Culture  Setup Time 06/15/2012 05:38   Final    Culture NO GROWTH 5 DAYS   Final    Report Status 06/21/2012 FINAL   Final   CULTURE, BLOOD (ROUTINE X 2)     Status: Normal   Collection Time   06/14/12 10:10 PM      Component Value Range Status Comment   Specimen Description BLOOD R HAND   Final    Special Requests BOTTLES DRAWN AEROBIC ONLY [facility]   Final    Culture  Setup Time 06/15/2012 05:38   Final    Culture NO GROWTH 5 DAYS   Final    Report Status 06/21/2012 FINAL   Final   CLOSTRIDIUM DIFFICILE BY PCR     Status: Abnormal   Collection Time   06/20/12 11:51 AM      Component Value Range Status Comment   C difficile by pcr POSITIVE (*) NEGATIVE Final      Studies: Dg Chest 2 View  06/16/2012  *RADIOLOGY REPORT*  Clinical Data: Follow up pneumonia.  No cough for congestion. History breast cancer.  CHEST - 2 VIEW  Comparison: 06/22/2011.  Findings: Left lower lobe opacity is present.  This is in the retrocardiac region, better seen on the lateral view.  Increased density over the lower thoracic spine.  The cardiopericardial silhouette appears within normal limits.  Stable prominence of the ascending aorta on the lateral view.  Basilar atelectasis present on the right.  Small pleural effusion on the left.  Follow-up to ensure radiographic clearing recommended.  Clearing is usually observed at 8 weeks.   IMPRESSION: Left lower lobe opacity.  On prior abdominal CT, this area was visualized and represents an area of atelectasis and / or scarring. Superimposed pneumonia is difficult to exclude but based on the prior CT, pneumonia is unlikely.  Follow-up to ensure resolution is recommended.   Original Report Authenticated By: Andreas Newport, M.D.    Ct Abdomen Pelvis W Contrast  06/14/2012  *RADIOLOGY REPORT*  Clinical Data: Progressive left abdominal pain.  History of diverticulitis.  CT ABDOMEN AND PELVIS WITH CONTRAST  Technique:  Multidetector CT imaging of the abdomen and pelvis was  performed following the standard protocol during bolus administration of intravenous contrast.  Contrast: OMNIPAQUE IOHEXOL 300 MG/ML  SOLN  Comparison: Acute abdominal series same date.  Abdominal pelvic CT 06/12/2012.  Findings: There is mildly increased atelectasis at both lung bases. No significant pleural effusion is demonstrated.  Multiple hepatic cysts are again noted.  There is a stable cyst posteriorly in the mid left kidney.  The right kidney appears normal.  The spleen, gallbladder, pancreas and adrenal glands appear normal.  Small splenic artery aneurysms are unchanged. There is no evidence of large vessel occlusion.  Distal transverse colon wall thickening and surrounding inflammatory change are stable.  There is no evidence of bowel obstruction, perforation or extraluminal fluid collection.  The small bowel and appendix appear normal.  The uterus, ovaries and bladder appear normal.  There are no acute osseous findings.  IMPRESSION:  1.  Stable probable inflammatory process involving the distal transverse colon and splenic flexure since CT performed 2 days ago. Again, findings most likely represent diverticulitis, although focal ischemic colitis and underlying mucosal lesion cannot be excluded. 2.  No evidence of perforation or obstruction.  No evidence of large vessel occlusion. 3.  Stable hepatic and left renal  cysts.   Original Report Authenticated By: Gerrianne Scale, M.D.    Ct Abdomen Pelvis W Contrast  06/12/2012  *RADIOLOGY REPORT*  Clinical Data: Lower abdominal pain, nausea, vomiting and elevated white blood cell count.  CT ABDOMEN AND PELVIS WITH CONTRAST  Technique:  Multidetector CT imaging of the abdomen and pelvis was performed following the standard protocol during bolus administration of intravenous contrast.  Contrast: OMNIPAQUE IOHEXOL 300 MG/ML  SOLN  Comparison: None.  Findings: At the level of the distal transverse colon and splenic flexure, focal inflammation and dilatation of the colon is present with a stool ball present measuring approximately 5 cm.  At this level, inflammatory changes are present surrounding the colon and findings most likely reflect acute diverticulitis.  The colon is also thickened at this level and it would be difficult to exclude underlying tumor or stricture by CT.  The colon distal to this level is decompressed.  No evidence of enlarged lymph nodes or free intraperitoneal air. No focal abscess or significant free fluid is identified.  Small bowel loops are normal in caliber.  The liver shows multiple cysts which all appear benign.  The largest in the left lobe measures approximately 4.1 cm and in the right lobe the largest measures 3.6 cm.  There is no evidence of biliary ductal dilatation.  The gallbladder is unremarkable.  The pancreas, spleen, adrenal glands and kidneys are unremarkable. There are focal partially calcified aneurysms involving the distal branches of the splenic artery near the splenic hilum.  The larger measures approximately 8 mm in diameter and a smaller aneurysm measures approximately 4 mm in diameter.  No evidence of rupture.  The bladder is unremarkable.  Uterus and adnexal regions are unremarkable by CT.  No hernias are identified.  IMPRESSION:  1.  Inflammation of the distal transverse colon and splenic flexure at the level of focal stool  accumulation.  The findings likely relate to acute diverticulitis.  However, underlying lesion or stricture of the colon cannot be excluded.  There is no evidence of extraluminal air to suggest overt colonic perforation. 2.  Hepatic cysts which appear benign. 3.  Incidental small splenic artery aneurysms with the largest measuring 8 mm and a smaller aneurysm measuring 4 mm.   Original Report  Authenticated By: Reola Calkins, M.D.    Dg Abd Acute W/chest  06/14/2012  *RADIOLOGY REPORT*  Clinical Data: Left lower quadrant abdominal pain, nausea and vomiting.  ACUTE ABDOMEN SERIES (ABDOMEN 2 VIEW & CHEST 1 VIEW)  Comparison: Chest dated 06/22/2011 and abdomen and pelvis CT dated 06/12/2012.  Findings: The cardiac silhouette is borderline enlarged.  Interval linear density at the right lung base and patchy density at the left lung base.  Possible small left pleural effusion.  Normal bowel gas pattern without free peritoneal air.  Diffuse osteopenia.  IMPRESSION:  1.  Patchy opacity at the left lung base concerning for pneumonia. 2.  Minimal right basilar atelectasis. 3.  No acute abdominal abnormality.   Original Report Authenticated By: Darrol Angel, M.D.     Scheduled Meds:    . aspirin EC  81 mg Oral Daily  . beclomethasone  2 puff Inhalation BID  . fluticasone  2 spray Each Nare Daily  . levothyroxine  112 mcg Oral Q0600  . loratadine  10 mg Oral Daily  . metroNIDAZOLE  500 mg Oral Q8H  . montelukast  10 mg Oral Daily  . multivitamin with minerals  1 tablet Oral Daily  . pantoprazole  40 mg Oral BID AC  . potassium chloride  40 mEq Oral Q6H  . saccharomyces boulardii  250 mg Oral BID  . tamoxifen  20 mg Oral Daily  . DISCONTD: piperacillin-tazobactam (ZOSYN)  IV  3.375 g Intravenous Q8H   Continuous Infusions:    . sodium chloride 20 mL/hr at 06/21/12 4098    Principal Problem:  *Diverticulitis Active Problems:  BREAST CANCER  ASTHMA  GERD  Healthcare-associated pneumonia  ARF  (acute renal failure)    Time spent: 35 minutes    [facility]  [facility] Pager [phone]. If 8PM-8AM, please contact night-coverage at [email or web address], password [facility] 06/21/2012, 12:28 PM  LOS: 7 days

## 2012-06-21 NOTE — Progress Notes (Addendum)
Pt refused Zosyn. Explained the importance of antibiotic. Pt verbalized understanding and still refused.  Dorris Fetch, RN.

## 2012-06-21 NOTE — Progress Notes (Signed)
Pt refused Zosyn. Pt was educated on the indication for the antibiotic and verbalized understanding. Pt. Still refused. States will discuss with doctor in the AM.  Dorris Fetch, RN.

## 2012-06-21 NOTE — Progress Notes (Signed)
Pt's BP 175/73 then rechecked and BP was 178/76. Discussed with pt if BP has been issue previously. Pt reported that she takes a regimen of Benicar at home to control BP. Pt has not received medication during hospitalization. I contacted Elray Mcgregor, Hospitalist to inform about elevated BP. Lynch gave orders for a 1 time dose of hydralazine to be given.  Dorris Fetch, RN

## 2012-06-21 NOTE — Progress Notes (Signed)
Late entry: IV team RN in to restart expired IV site. Pt refused restart and requested to wait and discuss with MD in the morning. IV site WNL with no redness, no pain nor swelling. IV patent running at [facility] as recently ordered. Assurance given.

## 2012-06-21 NOTE — Progress Notes (Signed)
Subjective: Diarrhea improving; started on metronidazole and Zosyn discontinued. Is hungry. Abdominal pain continues to improve.  Objective: Vital signs in last 24 hours: Temp:  [97.6 F (36.4 C)-99.1 F (37.3 C)] 98.4 F (36.9 C) (09/08 0430) Pulse Rate:  [77-79] 77  (09/08 0430) Resp:  [16-20] 18  (09/08 0430) BP: (157-178)/(71-77) 159/71 mmHg (09/08 0430) SpO2:  [90 %-93 %] 90 % (09/08 0430) Weight change:  Last BM Date: 06/21/12  PE: GEN:  Bit deconditioned, but overall appears better cf. Yesterday HEENT:  Slightly dry mucous membranes ABD: Soft.  Lab Results: CMP     Component Value Date/Time   NA 142 06/21/2012 0438   K 3.0* 06/21/2012 0438   CL 109 06/21/2012 0438   CO2 22 06/21/2012 0438   GLUCOSE 92 06/21/2012 0438   BUN 16 06/21/2012 0438   CREATININE 1.53* 06/21/2012 0438   CALCIUM 7.8* 06/21/2012 0438   PROT 5.5* 06/19/2012 0430   ALBUMIN 2.6* 06/19/2012 0430   AST 20 06/19/2012 0430   ALT 10 06/19/2012 0430   ALKPHOS 33* 06/19/2012 0430   BILITOT 0.3 06/19/2012 0430   GFRNONAA 36* 06/21/2012 0438   GFRAA 41* 06/21/2012 0438   Studies/Results: Clostridium difficile PCR positive  Assessment:  1.  Generalized and migratory abdominal pains, improving. 2.  Abnormal CT abd/pelvis, thickening around splenic flexure, diverticulitis versus ischemic colitis, improving. 3.  C. Difficile associated diarrhea.  Likely from recent course of antibiotics.  Plan:  1.  Renal function continues to improve; can accordingly decrease intravenous fluids. 2.  Flagyl 500 mg tid x 14 days. 3.  Florastor 250 mg bid x 6 weeks. 4.  Agree with advancing diet.  Hopefully home in next day or two. 5.  Will follow; thank you for the consult.   Freddy Jaksch 06/21/2012, 11:25 AM

## 2012-06-22 LAB — CBC
MCV: 86 fL (ref 78.0–100.0)
Platelets: 215 10*3/uL (ref 150–400)
RBC: 3.72 MIL/uL — ABNORMAL LOW (ref 3.87–5.11)
WBC: 12.5 10*3/uL — ABNORMAL HIGH (ref 4.0–10.5)

## 2012-06-22 LAB — BASIC METABOLIC PANEL
CO2: 23 mEq/L (ref 19–32)
Calcium: 8.1 mg/dL — ABNORMAL LOW (ref 8.4–10.5)
Chloride: 107 mEq/L (ref 96–112)
GFR calc Af Amer: 49 mL/min — ABNORMAL LOW (ref >90)
Sodium: 141 mEq/L (ref 135–145)

## 2012-06-22 MED ORDER — METRONIDAZOLE 500 MG PO TABS: 500.0000 mg | ORAL_TABLET | Freq: Three times a day (TID) | ORAL | Status: AC

## 2012-06-22 MED ORDER — SACCHAROMYCES BOULARDII 250 MG PO CAPS: 250.0000 mg | ORAL_CAPSULE | Freq: Two times a day (BID) | ORAL | Status: AC

## 2012-06-22 MED ORDER — POTASSIUM CHLORIDE 20 MEQ PO PACK
60.0000 meq | PACK | Freq: Four times a day (QID) | ORAL | Status: DC
  Filled 2012-06-22 (×2): qty 3

## 2012-06-22 MED ORDER — MAGIC MOUTHWASH: 5.0000 mL | Freq: Three times a day (TID) | ORAL | Status: DC | PRN

## 2012-06-22 MED ORDER — OXYCODONE-ACETAMINOPHEN 5-325 MG PO TABS: 1.0000 | ORAL_TABLET | ORAL | Status: AC | PRN

## 2012-06-22 NOTE — Progress Notes (Signed)
Physical Therapy Discharge Note  Pt reports she has been independently moving around room and using bathroom for ADLs and ambulates frequently with husband.  Pt feels she no longer has physical therapy needs at this time and declines HHPT as well.  Pt reports LEs feel a little swollen but reports she has been performing ankle pumps and will continue to do so.  Pt agreed to ask for re-order if she changes her mind or new needs arise.  PT to sign off at this time. Thanks,  Zenovia Jarred, PT Pager: [phone]

## 2012-06-22 NOTE — Progress Notes (Signed)
Subjective: Abdominal pain improving.  Objective: Vital signs in last 24 hours: Temp:  [98.1 F (36.7 C)-99.2 F (37.3 C)] 98.6 F (37 C) (09/09 0513) Pulse Rate:  [76-85] 85  (09/09 0513) Resp:  [16-18] 16  (09/09 0513) BP: (158-175)/(68-74) 158/68 mmHg (09/09 0513) SpO2:  [92 %-94 %] 92 % (09/09 0513) Weight:  [86.6 kg (190 lb 14.7 oz)] 86.6 kg (190 lb 14.7 oz) (09/09 0519) Weight change:  Last BM Date: 06/21/12  PE: GEN:  NAD ABD:  Soft  Assessment:  1.  Abdominal pain, suspect due to diverticulitis versus ischemic colitis.  Improving. 2.  Thickening of splenic flexure on CT, diverticulitis versus ischemic colitis. 3.  C. Difficile associated diarrhea, likely from antibiotics to treat #1 and # 2 above.  Improving.  Plan:  1.  Metronidazole 500 mg po tid x 14 days. 2.  Florastor 250 mg po bid x 6 weeks. 3.  Will arrange outpatient follow-up with Dr. Dorena Cookey, her primary gastroenterologist, for consideration of repeat colonoscopy in a few weeks for direct inspection of the splenic flexure. 4.  Will sign-off; please call with questions; thank you for the consult.   Allison Clark 06/22/2012, 10:47 AM

## 2012-06-22 NOTE — Discharge Summary (Signed)
Physician Discharge Summary  Allison Clark WUJ:[id number] DOB: 11-05-1950 DOA: 06/14/2012  PCP: Ezequiel Kayser, MD  Admit date: 06/14/2012 Discharge date: 06/22/2012  Recommendations for Outpatient Follow-up:  Follow up with your primary care physician.  Discharge Diagnoses:  Principal Problem:  *C. difficile colitis Active Problems:  BREAST CANCER  ASTHMA  GERD  Diverticulitis  Healthcare-associated pneumonia  ARF (acute renal failure)   Discharge Condition: Stable  Diet recommendation: Parke Simmers diet  Filed Weights   06/14/12 2100 06/22/12 0519  Weight: 77.1 kg (169 lb 15.6 oz) 86.6 kg (190 lb 14.7 oz)    History of present illness:  61 year old female with history of GERD, hypothyroidism, asthma presented with left lower quadrant abdominal pain started today shortly after eating and has lasted about 45 minutes, continuously, 8/10 in intensity, non radiating and not relieved with home pain medications. Patient was seen few days prior for the same abdominal pain, was found to have diverticulitis and was prescribed antibiotics. Initially she reported she felt well but the pain came back suddenly today again with associated nausea but no vomiting. No fever or chills, no shortness of breath, no chest pain. NO lightheadedness and dizziness. No reports of blood in stool or urine.  Hospital Course:   1. C. difficile colitis: patient admitted to the hospital because of abdominal pain, she did have nausea and vomiting. She was treated as outpatient for diverticulitis with Augmentin, after admission to the hospital she developed diarrhea, gastroenterology service was consulted at that time. They recommended to check C. difficile which came back positive. Patient was on Zosyn which is started empirically for diverticulitis/colitis at the time of admission, so that was discontinued and Flagyl was instituted. Recommendation to continue Flagyl 3 times a day for 14 days. Patient to followup with Dr.  Christiana Pellant as outpatient for evaluation for colonoscopy.  2. Acute renal failure: This was likely secondary to dehydration for poor oral intake, this is exacerbated by contrast medium. Patient got to CT scans of abdomen/pelvis with IV contrast within one week. Anyway at the time of admission her FENa was only 0.89% indicating that is prerenal acute renal failure. Patient was started on aggressive IV fluid hydration after her creatinine reached a peak of 1.92. Creatinine improved slowly to 1.33 at the day of discharge. Patient did have some edema after the IV fluid, this edema improved significantly after the discontinuation of the IV fluids.  3. Hypothyroidism: This is chronic stable condition, her Synthroid was continued throughout the hospital stay.  4. Breast cancer: Patient follows with Dr. [name], she is on tamoxifen, she is to follow as her oncologist as outpatient.  5. Hypertension: Blood pressure was in the low side at the time of admission and because of her renal insufficiency to her Benicar held, and it was restarted at the day of discharge.  Procedures:  None  Consultations:  Will Outlaw of [facility]  Discharge Exam: Filed Vitals:   06/22/12 0123 06/22/12 0428 06/22/12 0513 06/22/12 0519  BP: 168/72 167/70 158/68   Pulse:   85   Temp:   98.6 F (37 C)   TempSrc:   Oral   Resp:   16   Height:      Weight:    86.6 kg (190 lb 14.7 oz)  SpO2:   92%    General: Alert and awake, oriented x3, not in any acute distress. HEENT: anicteric sclera, pupils reactive to light and accommodation, EOMI CVS: S1-S2 clear, no murmur rubs or  gallops Chest: clear to auscultation bilaterally, no wheezing, rales or rhonchi Abdomen: soft nontender, nondistended, normal bowel sounds, no organomegaly Extremities: no cyanosis, clubbing or edema noted bilaterally Neuro: Cranial nerves II-XII intact, no focal neurological deficits   Discharge Instructions  Discharge Orders    Future  Appointments: Provider: Department: Dept Phone: Center:   07/02/2012 8:50 AM [facility] [phone] [facility]   01/28/2013 3:30 PM Krista Blue [facility] [phone] None   02/04/2013 3:30 PM Lowella Dell, MD [facility] [phone] None     Future Orders Please Complete By Expires   Diet - low sodium heart healthy      Increase activity slowly        Medication List  As of 06/22/2012  5:18 PM   STOP taking these medications         amoxicillin-clavulanate 875-125 MG per tablet      HYDROcodone-acetaminophen 5-500 MG per tablet      ondansetron 4 MG tablet         TAKE these medications         aspirin 81 MG tablet   Take 81 mg by mouth daily.      beclomethasone 80 MCG/ACT inhaler   Commonly known as: QVAR   Inhale 2 puffs into the lungs 2 (two) times daily.      BENICAR 20 MG tablet   Generic drug: olmesartan   Take 20 mg by mouth daily.      dexlansoprazole 60 MG capsule   Commonly known as: DEXILANT   Take 60 mg by mouth daily.      fish oil-omega-3 fatty acids 1000 MG capsule   Take 2 g by mouth daily.      fluticasone 50 MCG/ACT nasal spray   Commonly known as: FLONASE   Place 2 sprays into the nose daily.      levalbuterol 45 MCG/ACT inhaler   Commonly known as: XOPENEX HFA   Inhale 1-2 puffs into the lungs every 4 (four) hours as needed. Shortness of breath      levothyroxine 112 MCG tablet   Commonly known as: SYNTHROID, LEVOTHROID   Take 112 mcg by mouth daily.      loratadine 10 MG tablet   Commonly known as: CLARITIN   Take 10 mg by mouth daily.      magic mouthwash Soln   Take 5 mLs by mouth 3 (three) times daily as needed.      metroNIDAZOLE 500 MG tablet   Commonly known as: FLAGYL   Take 1 tablet (500 mg total) by mouth 3 (three) times daily.      montelukast 10 MG tablet   Commonly known as: SINGULAIR   Take 10 mg by mouth daily.      multivitamin with minerals Tabs   Take 1 tablet by  mouth daily.      oxyCODONE-acetaminophen 5-325 MG per tablet   Commonly known as: PERCOCET/ROXICET   Take 1 tablet by mouth every 4 (four) hours as needed for pain.      ranitidine 300 MG tablet   Commonly known as: ZANTAC   Take 300 mg by mouth at bedtime.      saccharomyces boulardii 250 MG capsule   Commonly known as: FLORASTOR   Take 1 capsule (250 mg total) by mouth 2 (two) times daily.      tamoxifen 20 MG tablet   Commonly known as: NOLVADEX   Take 1 tablet (20 mg total)  by mouth daily.           Follow-up Information    Follow up with PERINI,MARK A, MD in 1 week.   Contact information:   [address] [place] [address] [phone]           The results of significant diagnostics from this hospitalization (including imaging, microbiology, ancillary and laboratory) are listed below for reference.    Significant Diagnostic Studies: Dg Chest 2 View  06/16/2012  *RADIOLOGY REPORT*  Clinical Data: Follow up pneumonia.  No cough for congestion. History breast cancer.  CHEST - 2 VIEW  Comparison: 06/22/2011.  Findings: Left lower lobe opacity is present.  This is in the retrocardiac region, better seen on the lateral view.  Increased density over the lower thoracic spine.  The cardiopericardial silhouette appears within normal limits.  Stable prominence of the ascending aorta on the lateral view.  Basilar atelectasis present on the right.  Small pleural effusion on the left.  Follow-up to ensure radiographic clearing recommended.  Clearing is usually observed at 8 weeks.  IMPRESSION: Left lower lobe opacity.  On prior abdominal CT, this area was visualized and represents an area of atelectasis and / or scarring. Superimposed pneumonia is difficult to exclude but based on the prior CT, pneumonia is unlikely.  Follow-up to ensure resolution is recommended.   Original Report Authenticated By: Andreas Newport, M.D.    US Abdomen Complete  06/20/2012  *RADIOLOGY  REPORT*  Clinical Data:  Upper abdominal pain  ABDOMINAL ULTRASOUND COMPLETE  Comparison:  Renal ultrasound 06/18/2012 and abdominal CT 06/14/2012  Findings:  Gallbladder:  No gallstones, gallbladder wall thickening, or pericholecystic fluid.  Common Bile Duct:  Within normal limits in caliber. Measures 2 mm.  Liver:  Within normal limits in parenchymal echogenicity. There are multiple hepatic cysts, the largest is in the left lobe measuring 4.3 x 3.3 x 4.3 cm.  IVC:  Appears normal.  Pancreas:  Although the pancreas is difficult to visualize in its entirety, no focal pancreatic abnormality is identified.  Spleen:  Within normal limits in size and echotexture.  Measures 7.2 cm in length.  Right kidney:  Normal in size and parenchymal echogenicity.  No evidence of mass. The patient has an extrarenal pelvis. Mild fullness of the right renal collecting system without definite hydronephrosis.  Left kidney:  Normal in size and parenchymal echogenicity.  No evidence of mass or hydronephrosis.  Abdominal Aorta:  No aneurysm identified.  A small amount of perinephric fluid is seen surrounding both kidneys.  Bilateral pleural effusions are noted.  IMPRESSION:  1.  Multiple hepatic cysts. 2.  Slight fullness of the right renal collecting system without definite hydronephrosis.  The patient is noted to have an extrarenal pelvis, a normal anatomic variant. 3.  Small amount of perinephric fluid bilaterally. 4.  Small bilateral pleural effusions.   Original Report Authenticated By: Britta Mccreedy, M.D.    Ct Abdomen Pelvis W Contrast  06/14/2012  *RADIOLOGY REPORT*  Clinical Data: Progressive left abdominal pain.  History of diverticulitis.  CT ABDOMEN AND PELVIS WITH CONTRAST  Technique:  Multidetector CT imaging of the abdomen and pelvis was performed following the standard protocol during bolus administration of intravenous contrast.  Contrast: OMNIPAQUE IOHEXOL 300 MG/ML  SOLN  Comparison: Acute abdominal series same  date.  Abdominal pelvic CT 06/12/2012.  Findings: There is mildly increased atelectasis at both lung bases. No significant pleural effusion is demonstrated.  Multiple hepatic cysts are again noted.  There is a stable cyst posteriorly in the mid left kidney.  The right kidney appears normal.  The spleen, gallbladder, pancreas and adrenal glands appear normal.  Small splenic artery aneurysms are unchanged. There is no evidence of large vessel occlusion.  Distal transverse colon wall thickening and surrounding inflammatory change are stable.  There is no evidence of bowel obstruction, perforation or extraluminal fluid collection.  The small bowel and appendix appear normal.  The uterus, ovaries and bladder appear normal.  There are no acute osseous findings.  IMPRESSION:  1.  Stable probable inflammatory process involving the distal transverse colon and splenic flexure since CT performed 2 days ago. Again, findings most likely represent diverticulitis, although focal ischemic colitis and underlying mucosal lesion cannot be excluded. 2.  No evidence of perforation or obstruction.  No evidence of large vessel occlusion. 3.  Stable hepatic and left renal cysts.   Original Report Authenticated By: Gerrianne Scale, M.D.    Ct Abdomen Pelvis W Contrast  06/12/2012  *RADIOLOGY REPORT*  Clinical Data: Lower abdominal pain, nausea, vomiting and elevated white blood cell count.  CT ABDOMEN AND PELVIS WITH CONTRAST  Technique:  Multidetector CT imaging of the abdomen and pelvis was performed following the standard protocol during bolus administration of intravenous contrast.  Contrast: OMNIPAQUE IOHEXOL 300 MG/ML  SOLN  Comparison: None.  Findings: At the level of the distal transverse colon and splenic flexure, focal inflammation and dilatation of the colon is present with a stool ball present measuring approximately 5 cm.  At this level, inflammatory changes are present surrounding the colon and findings most likely  reflect acute diverticulitis.  The colon is also thickened at this level and it would be difficult to exclude underlying tumor or stricture by CT.  The colon distal to this level is decompressed.  No evidence of enlarged lymph nodes or free intraperitoneal air. No focal abscess or significant free fluid is identified.  Small bowel loops are normal in caliber.  The liver shows multiple cysts which all appear benign.  The largest in the left lobe measures approximately 4.1 cm and in the right lobe the largest measures 3.6 cm.  There is no evidence of biliary ductal dilatation.  The gallbladder is unremarkable.  The pancreas, spleen, adrenal glands and kidneys are unremarkable. There are focal partially calcified aneurysms involving the distal branches of the splenic artery near the splenic hilum.  The larger measures approximately 8 mm in diameter and a smaller aneurysm measures approximately 4 mm in diameter.  No evidence of rupture.  The bladder is unremarkable.  Uterus and adnexal regions are unremarkable by CT.  No hernias are identified.  IMPRESSION:  1.  Inflammation of the distal transverse colon and splenic flexure at the level of focal stool accumulation.  The findings likely relate to acute diverticulitis.  However, underlying lesion or stricture of the colon cannot be excluded.  There is no evidence of extraluminal air to suggest overt colonic perforation. 2.  Hepatic cysts which appear benign. 3.  Incidental small splenic artery aneurysms with the largest measuring 8 mm and a smaller aneurysm measuring 4 mm.   Original Report Authenticated By: Reola Calkins, M.D.    US Renal  06/18/2012  *RADIOLOGY REPORT*  Clinical Data: Acute renal failure.  RENAL/URINARY TRACT ULTRASOUND COMPLETE  Comparison:  CT abdomen and pelvis 06/14/2012.  Findings:  Right Kidney:  Measures 13.0 cm.  There is some increased cortical echogenicity.  No stone, mass or hydronephrosis.  Tiny amount of fluid off the inferior pole  is noted.  Left Kidney:  Measures 14.5 cm.  Cortical echogenicity is somewhat increased.  No stone, mass or hydronephrosis.  Bladder:  Unremarkable.  IMPRESSION: Findings compatible with medical renal disease.  Negative for hydronephrosis.   Original Report Authenticated By: Bernadene Bell. D'ALESSIO, M.D.    Dg Abd Acute W/chest  06/14/2012  *RADIOLOGY REPORT*  Clinical Data: Left lower quadrant abdominal pain, nausea and vomiting.  ACUTE ABDOMEN SERIES (ABDOMEN 2 VIEW & CHEST 1 VIEW)  Comparison: Chest dated 06/22/2011 and abdomen and pelvis CT dated 06/12/2012.  Findings: The cardiac silhouette is borderline enlarged.  Interval linear density at the right lung base and patchy density at the left lung base.  Possible small left pleural effusion.  Normal bowel gas pattern without free peritoneal air.  Diffuse osteopenia.  IMPRESSION:  1.  Patchy opacity at the left lung base concerning for pneumonia. 2.  Minimal right basilar atelectasis. 3.  No acute abdominal abnormality.   Original Report Authenticated By: Darrol Angel, M.D.     Microbiology: Recent Results (from the past 240 hour(s))  CULTURE, BLOOD (ROUTINE X 2)     Status: Normal   Collection Time   06/14/12  9:00 PM      Component Value Range Status Comment   Specimen Description BLOOD R FOREARM   Final    Special Requests BOTTLES DRAWN AEROBIC AND ANAEROBIC 4CC   Final    Culture  Setup Time 06/15/2012 05:38   Final    Culture NO GROWTH 5 DAYS   Final    Report Status 06/21/2012 FINAL   Final   CULTURE, BLOOD (ROUTINE X 2)     Status: Normal   Collection Time   06/14/12 10:10 PM      Component Value Range Status Comment   Specimen Description BLOOD R HAND   Final    Special Requests BOTTLES DRAWN AEROBIC ONLY [facility]   Final    Culture  Setup Time 06/15/2012 05:38   Final    Culture NO GROWTH 5 DAYS   Final    Report Status 06/21/2012 FINAL   Final   CLOSTRIDIUM DIFFICILE BY PCR     Status: Abnormal   Collection Time   06/20/12 11:51 AM       Component Value Range Status Comment   C difficile by pcr POSITIVE (*) NEGATIVE Final      Labs: Basic Metabolic Panel:  Lab 06/22/12 4010 06/21/12 0438 06/20/12 0430 06/19/12 0403 06/18/12 0444  NA 141 142 144 141 138  K 3.2* 3.0* 3.2* 3.4* 3.5  CL 107 109 110 106 104  CO2 23 22 21 23 25   GLUCOSE 103* 92 89 98 106*  BUN 13 16 15 15 15   CREATININE 1.33* 1.53* 1.71* 1.88* 1.92*  CALCIUM 8.1* 7.8* 7.6* 8.3* 8.2*  MG -- -- -- -- --  PHOS -- -- -- -- --   Liver Function Tests:  Lab 06/19/12 0430  AST 20  ALT 10  ALKPHOS 33*  BILITOT 0.3  PROT 5.5*  ALBUMIN 2.6*    Lab 06/19/12 0430  LIPASE 28  AMYLASE --   No results found for this basename: AMMONIA:5 in the last 168 hours CBC:  Lab 06/22/12 0408 06/21/12 0438 06/20/12 0430 06/19/12 0403 06/18/12 0444  WBC 12.5* 11.0* 12.6* 12.9* 12.1*  NEUTROABS -- -- -- -- --  HGB 11.0* 10.0* 10.2* 12.0 11.5*  HCT 32.0* 29.8* 30.4* 36.1 34.4*  MCV  86.0 86.4 87.1 87.4 87.8  PLT 215 200 202 199 217   Cardiac Enzymes: No results found for this basename: CKTOTAL:5,CKMB:5,CKMBINDEX:5,TROPONINI:5 in the last 168 hours BNP: BNP (last 3 results) No results found for this basename: PROBNP:3 in the last 8760 hours CBG: No results found for this basename: GLUCAP:5 in the last 168 hours  Time coordinating discharge: 40 minutes  Signed:  Twana Wileman A  [facility] 06/22/2012, 5:18 PM

## 2012-07-01 DIAGNOSIS — A0472 Enterocolitis due to Clostridium difficile, not specified as recurrent: Secondary | ICD-10-CM | POA: Insufficient documentation

## 2012-07-01 DIAGNOSIS — E669 Obesity, unspecified: Secondary | ICD-10-CM | POA: Insufficient documentation

## 2012-07-01 DIAGNOSIS — K5732 Diverticulitis of large intestine without perforation or abscess without bleeding: Secondary | ICD-10-CM

## 2012-07-01 DIAGNOSIS — Z2839 Other underimmunization status: Secondary | ICD-10-CM | POA: Insufficient documentation

## 2012-07-01 HISTORY — DX: Obesity, unspecified: E66.9

## 2012-07-01 HISTORY — DX: Diverticulitis of large intestine without perforation or abscess without bleeding: K57.32

## 2012-07-01 HISTORY — DX: Other underimmunization status: Z28.39

## 2012-07-01 HISTORY — DX: Enterocolitis due to Clostridium difficile, not specified as recurrent: A04.72

## 2012-07-02 ENCOUNTER — Ambulatory Visit
Admission: RE | Admit: 2012-07-02 | Discharge: 2012-07-02 | Disposition: A | Discharge status: Home / Self Care | Payer: Managed Care, Other (non HMO) | Source: Ambulatory Visit | Attending: Oncology | Admitting: Oncology

## 2012-07-02 DIAGNOSIS — Z1231 Encounter for screening mammogram for malignant neoplasm of breast: Secondary | ICD-10-CM

## 2013-01-07 ENCOUNTER — Telehealth: Payer: Self-pay | Admitting: *Deleted

## 2013-01-07 NOTE — Telephone Encounter (Signed)
Pt called to change her lab appt from 4/17 to 4/16@ 3:30pm.

## 2013-01-27 ENCOUNTER — Other Ambulatory Visit: Payer: Self-pay | Admitting: *Deleted

## 2013-01-27 ENCOUNTER — Other Ambulatory Visit (HOSPITAL_BASED_OUTPATIENT_CLINIC_OR_DEPARTMENT_OTHER): Payer: Managed Care, Other (non HMO) | Admitting: Lab

## 2013-01-27 DIAGNOSIS — C50919 Malignant neoplasm of unspecified site of unspecified female breast: Secondary | ICD-10-CM

## 2013-01-27 DIAGNOSIS — C50419 Malignant neoplasm of upper-outer quadrant of unspecified female breast: Secondary | ICD-10-CM

## 2013-01-27 LAB — CBC WITH DIFFERENTIAL/PLATELET
BASO%: 0.8 % (ref 0.0–2.0)
Eosinophils Absolute: 0.2 10*3/uL (ref 0.0–0.5)
HCT: 37.2 % (ref 34.8–46.6)
HGB: 12.4 g/dL (ref 11.6–15.9)
LYMPH%: 28.6 % (ref 14.0–49.7)
MCHC: 33.3 g/dL (ref 31.5–36.0)
MONO#: 0.8 10*3/uL (ref 0.1–0.9)
NEUT#: 6.7 10*3/uL — ABNORMAL HIGH (ref 1.5–6.5)
NEUT%: 61.3 % (ref 38.4–76.8)
Platelets: 226 10*3/uL (ref 145–400)
WBC: 11 10*3/uL — ABNORMAL HIGH (ref 3.9–10.3)
lymph#: 3.1 10*3/uL (ref 0.9–3.3)

## 2013-01-27 LAB — COMPREHENSIVE METABOLIC PANEL (CC13)
ALT: 10 U/L (ref 0–55)
CO2: 22 mEq/L (ref 22–29)
Calcium: 9 mg/dL (ref 8.4–10.4)
Chloride: 107 mEq/L (ref 98–107)
Creatinine: 0.9 mg/dL (ref 0.6–1.1)
Glucose: 108 mg/dl — ABNORMAL HIGH (ref 70–99)
Total Bilirubin: 0.26 mg/dL (ref 0.20–1.20)
Total Protein: 6.8 g/dL (ref 6.4–8.3)

## 2013-01-28 ENCOUNTER — Other Ambulatory Visit: Payer: Managed Care, Other (non HMO) | Admitting: Lab

## 2013-02-04 ENCOUNTER — Ambulatory Visit (HOSPITAL_BASED_OUTPATIENT_CLINIC_OR_DEPARTMENT_OTHER): Payer: Managed Care, Other (non HMO) | Admitting: Oncology

## 2013-02-04 ENCOUNTER — Telehealth: Payer: Self-pay | Admitting: *Deleted

## 2013-02-04 VITALS — BP 132/76 | HR 106 | Temp 98.1°F | Ht 64.0 in | Wt 174.3 lb

## 2013-02-04 DIAGNOSIS — Z170 Estrogen receptor positive status [ER+]: Secondary | ICD-10-CM

## 2013-02-04 DIAGNOSIS — C50919 Malignant neoplasm of unspecified site of unspecified female breast: Secondary | ICD-10-CM

## 2013-02-04 DIAGNOSIS — C50419 Malignant neoplasm of upper-outer quadrant of unspecified female breast: Secondary | ICD-10-CM

## 2013-02-04 MED ORDER — TAMOXIFEN CITRATE 20 MG PO TABS: 20.0000 mg | ORAL_TABLET | Freq: Every day | ORAL | Status: DC

## 2013-02-04 NOTE — Progress Notes (Signed)
ID: Ulice Brilliant   DOB: Apr 26, 1951  MR#: [id number]  CSN#:[id number]  PCP: Ezequiel Kayser, MD   HISTORY OF PRESENT ILLNESS: Allison Clark has routine, regular mammograms which have been essentially unremarkable.  On the most recent mammogram, obtained May 24, 2003, at [facility], a possible mass was noted in the left breast.  [facility] was [facility] the [facility] for [facility] evaluation and on May 30, 2003, she had spot compression views and an ultrasound.  The spot compression views confirmed the presence of an area of architectural distortion and on the sonogram there was a small appearing, 0.6-cm, area of ill- defined shadowing.  This was biopsied on the same day and showed ([id number]) an infiltrating lobular carcinoma.  It was ER positive at 26%,  progesterone receptor positive at 6%, HER-2 neu negative.  With this information she was referred to [facility] and after an appropriate discussion she proceeded to a left lumpectomy and sentinel lymph node dissection on June 09, 2003.  The pathology here ([id number]) showed a 4.2-cm infiltrating lobular carcinoma, with 0/2 lymph nodes involved.  An initially positive margin was cleared with an additional resection during the same procedure.     Her subsequent history is as detailed below  INTERVAL HISTORY: Allison Clark returns today with [name] her husband for followup of her breast cancer. The interval history is unremarkable. She continues to work 35 hours a week, and both she and [name] feel that it would be hard for her to leave her job given [employer] coverage it provides.  REVIEW OF SYSTEMS: She is having some seasonal allergy symptoms including a little bit of a sore throat, but her asthma is much better controlled under the current medicines, which include Xopenex Qvar ranitidine Dexilant fluticasone Claritin and montelukast. She has occasional scattered joint pains which are not more persistent or intense than prior. She has hot flashes at bedtime, which  don't particularly keep her up at night. She had a "horrible" episode of C. difficile diarrhea following diverticulitis and she was in the hospital four-week but that. Otherwise she is tolerating tamoxifen with no significant side effects and a detailed review of systems today was noncontributory  PAST MEDICAL HISTORY: Osteoporosis Asthma Goiter,  GERD Arrhythmias DDD LBBB  PAST SURGICAL HISTORY: s/p thyroidectomy S/p BTL S/p A&T  FAMILY HISTORY The patient's father died at the age of 66 from cirrhosis.  The patient's mother died at the age of 45 from clots in her colon.  There is no other family history of a clotting disorder.  The patient's brother has a history of prostate cancer.  GYNECOLOGIC HISTORY: She is G2, P3.  Last menstrual period in 1998.  She never took hormones in terms of postmenopausal estrogen replacement therapy.    SOCIAL HISTORY:  She works as an Brewing technologist. Her husband [name] is in Airline pilot. He was diagnosed with prostate cancer in 2008. [name] has three children from a prior marriage, a  son who works as an Special educational needs teacher, and  twins, one who is in Aeronautical engineer and the other one works in a nursery.  They are all married.  She has three grandchildren. [name] has two children from a prior marriage and one grandson.  This large extended family is all in the area.  The patient is a member of the [employer].    ADVANCED DIRECTIVES: in place  HEALTH MAINTENANCE: History  Substance Use Topics  . Smoking status: Never Smoker   . Smokeless  tobacco: Not on file  . Alcohol Use: No     Colonoscopy:  PAP:  Bone density:  Lipid panel:  Allergies  Allergen Reactions  . Avelox (Moxifloxacin Hcl In Nacl) Hives  . Sulfonamide Derivatives     REACTION: rash    Current Outpatient Prescriptions  Medication Sig Dispense Refill  . Alum & Mag Hydroxide-Simeth (MAGIC MOUTHWASH) SOLN Take 5 mLs by mouth 3 (three) times daily  as needed.  15 mL  0  . aspirin 81 MG tablet Take 81 mg by mouth daily.      . beclomethasone (QVAR) 80 MCG/ACT inhaler Inhale 2 puffs into the lungs 2 (two) times daily.       Marland Kitchen BENICAR 20 MG tablet Take 20 mg by mouth daily.       Marland Kitchen dexlansoprazole (DEXILANT) 60 MG capsule Take 60 mg by mouth daily.      . fish oil-omega-3 fatty acids 1000 MG capsule Take 2 g by mouth daily.      . fluticasone (FLONASE) 50 MCG/ACT nasal spray Place 2 sprays into the nose daily.      Marland Kitchen levalbuterol (XOPENEX HFA) 45 MCG/ACT inhaler Inhale 1-2 puffs into the lungs every 4 (four) hours as needed. Shortness of breath      . levothyroxine (SYNTHROID, LEVOTHROID) 112 MCG tablet Take 112 mcg by mouth daily.      Marland Kitchen loratadine (CLARITIN) 10 MG tablet Take 10 mg by mouth daily.      . montelukast (SINGULAIR) 10 MG tablet Take 10 mg by mouth daily.       . Multiple Vitamin (MULTIVITAMIN WITH MINERALS) TABS Take 1 tablet by mouth daily.      . ranitidine (ZANTAC) 300 MG tablet Take 300 mg by mouth at bedtime.      . tamoxifen (NOLVADEX) 20 MG tablet Take 1 tablet (20 mg total) by mouth daily.  90 tablet  12   No current facility-administered medications for this visit.    OBJECTIVE: Middle-aged white woman who looks well Filed Vitals:   02/04/13 1530  BP: 132/76  Pulse: 106  Temp: 98.1 F (36.7 C)     Body mass index is 29.9 kg/(m^2).    ECOG FS: 0  Sclerae unicteric Oropharynx clear No peripheral adenopathy Lungs no rales or rhonchi Heart regular rate and rhythm Abd soft, nontender, positive bowel sounds MSK no focal spinal tenderness, no peripheral edema Neuro: nonfocal, well oriented, pleasant affect Breasts: right breast no suspicious masses; left breast status post lumpectomy and radiation, with no evidence of local recurrence. The left axilla is benign  LAB RESULTS: Lab Results  Component Value Date   WBC 11.0* 01/27/2013   NEUTROABS 6.7* 01/27/2013   HGB 12.4 01/27/2013   HCT 37.2 01/27/2013   MCV  86.9 01/27/2013   PLT 226 01/27/2013      Chemistry      Component Value Date/Time   NA 141 01/27/2013 1533   NA 141 06/22/2012 0408   K 4.0 01/27/2013 1533   K 3.2* 06/22/2012 0408   CL 107 01/27/2013 1533   CL 107 06/22/2012 0408   CO2 22 01/27/2013 1533   CO2 23 06/22/2012 0408   BUN 24.4 01/27/2013 1533   BUN 13 06/22/2012 0408   CREATININE 0.9 01/27/2013 1533   CREATININE 1.33* 06/22/2012 0408      Component Value Date/Time   CALCIUM 9.0 01/27/2013 1533   CALCIUM 8.1* 06/22/2012 0408   ALKPHOS 45 01/27/2013 1533   ALKPHOS 33*  06/19/2012 0430   AST 10 01/27/2013 1533   AST 20 06/19/2012 0430   ALT 10 01/27/2013 1533   ALT 10 06/19/2012 0430   BILITOT 0.26 01/27/2013 1533   BILITOT 0.3 06/19/2012 0430       Lab Results  Component Value Date   LABCA2 9 01/17/2011    No components found with this basename: [id number]    No results found for this basename: INR,  in the last 168 hours  Urinalysis    Component Value Date/Time   COLORURINE YELLOW 06/14/2012 1547    STUDIES: Mammography 07/02/2012 was unremarkable   ASSESSMENT:  62 y.o. [place], status post left lumpectomy and sentinel lymph node dissection August 2004 for a T2 N0, stage IIA invasive lobular carcinoma, grade 1,ER and PR positive, HER2/neu negative, treated adjuvantly with weekly Taxol times twelve, then radiation, then status post five years of Arimidex completed in February 2010, at which point she switched to tamoxifen  PLAN: She will continue on tamoxifen an additional year, and likely at that point will "graduate" from followup here. I did encourage her to exercise regularly, the goal being 45 minutes of walking or similar exercise 5 times a week. She knows to call for any problems that may develop before the next visit.   Hicks Feick C    02/04/2013

## 2013-02-04 NOTE — Telephone Encounter (Signed)
appts made and printed...td 

## 2013-06-16 ENCOUNTER — Other Ambulatory Visit: Payer: Self-pay

## 2013-06-16 DIAGNOSIS — Z853 Personal history of malignant neoplasm of breast: Secondary | ICD-10-CM

## 2013-06-16 DIAGNOSIS — Z1231 Encounter for screening mammogram for malignant neoplasm of breast: Secondary | ICD-10-CM

## 2013-06-16 DIAGNOSIS — Z9889 Other specified postprocedural states: Secondary | ICD-10-CM

## 2013-07-27 ENCOUNTER — Ambulatory Visit
Admission: RE | Admit: 2013-07-27 | Discharge: 2013-07-27 | Disposition: A | Discharge status: Home / Self Care | Payer: 59 | Source: Ambulatory Visit

## 2013-07-27 DIAGNOSIS — Z853 Personal history of malignant neoplasm of breast: Secondary | ICD-10-CM

## 2013-07-27 DIAGNOSIS — Z9889 Other specified postprocedural states: Secondary | ICD-10-CM

## 2013-07-27 DIAGNOSIS — Z1231 Encounter for screening mammogram for malignant neoplasm of breast: Secondary | ICD-10-CM

## 2013-12-06 ENCOUNTER — Other Ambulatory Visit (HOSPITAL_BASED_OUTPATIENT_CLINIC_OR_DEPARTMENT_OTHER): Payer: 59

## 2013-12-06 ENCOUNTER — Other Ambulatory Visit: Payer: Self-pay | Admitting: *Deleted

## 2013-12-06 DIAGNOSIS — C50419 Malignant neoplasm of upper-outer quadrant of unspecified female breast: Secondary | ICD-10-CM

## 2013-12-06 DIAGNOSIS — C50919 Malignant neoplasm of unspecified site of unspecified female breast: Secondary | ICD-10-CM

## 2013-12-06 LAB — CBC WITH DIFFERENTIAL/PLATELET
BASO%: 0.5 % (ref 0.0–2.0)
BASOS ABS: 0.1 10*3/uL (ref 0.0–0.1)
EOS%: 2.4 % (ref 0.0–7.0)
Eosinophils Absolute: 0.3 10*3/uL (ref 0.0–0.5)
HCT: 39 % (ref 34.8–46.6)
HEMOGLOBIN: 12.8 g/dL (ref 11.6–15.9)
LYMPH%: 22.9 % (ref 14.0–49.7)
MCH: 28.7 pg (ref 25.1–34.0)
MCHC: 32.7 g/dL (ref 31.5–36.0)
MCV: 87.9 fL (ref 79.5–101.0)
MONO#: 0.8 10*3/uL (ref 0.1–0.9)
MONO%: 7 % (ref 0.0–14.0)
NEUT#: 7.9 10*3/uL — ABNORMAL HIGH (ref 1.5–6.5)
NEUT%: 67.2 % (ref 38.4–76.8)
PLATELETS: 230 10*3/uL (ref 145–400)
RBC: 4.44 10*6/uL (ref 3.70–5.45)
RDW: 13.3 % (ref 11.2–14.5)
WBC: 11.7 10*3/uL — ABNORMAL HIGH (ref 3.9–10.3)
lymph#: 2.7 10*3/uL (ref 0.9–3.3)

## 2013-12-06 LAB — COMPREHENSIVE METABOLIC PANEL (CC13)
ALK PHOS: 50 U/L (ref 40–150)
ALT: 12 U/L (ref 0–55)
AST: 13 U/L (ref 5–34)
Albumin: 3.7 g/dL (ref 3.5–5.0)
Anion Gap: 11 mEq/L (ref 3–11)
BUN: 20.7 mg/dL (ref 7.0–26.0)
CO2: 21 mEq/L — ABNORMAL LOW (ref 22–29)
CREATININE: 0.8 mg/dL (ref 0.6–1.1)
Calcium: 9.1 mg/dL (ref 8.4–10.4)
Chloride: 107 mEq/L (ref 98–109)
Glucose: 101 mg/dl (ref 70–140)
POTASSIUM: 4 meq/L (ref 3.5–5.1)
Sodium: 140 mEq/L (ref 136–145)
Total Protein: 6.6 g/dL (ref 6.4–8.3)

## 2013-12-13 ENCOUNTER — Other Ambulatory Visit: Payer: Self-pay | Admitting: Oncology

## 2013-12-13 ENCOUNTER — Ambulatory Visit (HOSPITAL_BASED_OUTPATIENT_CLINIC_OR_DEPARTMENT_OTHER): Payer: 59 | Admitting: Oncology

## 2013-12-13 VITALS — BP 114/72 | HR 106 | Temp 98.6°F | Resp 18 | Ht 64.0 in | Wt 174.7 lb

## 2013-12-13 DIAGNOSIS — J45909 Unspecified asthma, uncomplicated: Secondary | ICD-10-CM

## 2013-12-13 DIAGNOSIS — A0472 Enterocolitis due to Clostridium difficile, not specified as recurrent: Secondary | ICD-10-CM

## 2013-12-13 DIAGNOSIS — C50419 Malignant neoplasm of upper-outer quadrant of unspecified female breast: Secondary | ICD-10-CM

## 2013-12-13 DIAGNOSIS — Z170 Estrogen receptor positive status [ER+]: Secondary | ICD-10-CM

## 2013-12-13 DIAGNOSIS — N179 Acute kidney failure, unspecified: Secondary | ICD-10-CM

## 2013-12-13 NOTE — Progress Notes (Signed)
ID: Allison Clark   DOB: 1951-09-16  MR#: [id number]  CSN#:[id number]  PCP: Allison Ly, MD   HISTORY OF PRESENT ILLNESS: Allison Clark has routine, regular mammograms which have been essentially unremarkable.  On the most recent mammogram, obtained May 24, 2003, at [facility], a possible mass was noted in the left breast.  She was referred to [facility] for further evaluation and on May 30, 2003, she had spot compression views and an ultrasound.  The spot compression views confirmed the presence of an area of architectural distortion and on the sonogram there was a small appearing, 0.6-cm, area of ill- defined shadowing.  This was biopsied on the same day and showed ([id number]) an infiltrating lobular carcinoma.  It was ER positive at 26%,  progesterone receptor positive at 6%, HER-2 neu negative.  With this information she was referred to [facility] and after an appropriate discussion she proceeded to a left lumpectomy and sentinel lymph node dissection on June 09, 2003.  The pathology here ([id number]) showed a 4.2-cm infiltrating lobular carcinoma, with 0/2 lymph nodes involved.  An initially positive margin was cleared with an additional resection during the same procedure.     Her subsequent history is as detailed below  INTERVAL HISTORY: Allison Clark returns today with Allison Clark her husband for followup of her breast cancer. The interval history is significant for her having retired from her job July 2014. She is enjoying her time off, which she spends with grandchildren, taking care of the house, and a little traveling (they recently went down to [facility] for a break from the cold).. She has not yet started an exercise program.   REVIEW OF SYSTEMS: She tolerated the tamoxifen without significant side effects. She did have some hot flashes, which are now fairly mild. She has pain in her knees. She can be somewhat short of breath when walking up stairs. Otherwise a detailed review of  systems today was noncontributory  PAST MEDICAL HISTORY: Osteoporosis Asthma Goiter,  GERD Arrhythmias DDD LBBB  PAST SURGICAL HISTORY: s/p thyroidectomy S/p BTL S/p A&T  FAMILY HISTORY The patient's father died at the age of 59 from cirrhosis.  The patient's mother died at the age of 4 from clots in her colon.  There is no other family history of a clotting disorder.  The patient's brother has a history of prostate cancer.  GYNECOLOGIC HISTORY: She is G2, P3.  Last menstrual period in 1998.  She never took hormones in terms of postmenopausal estrogen replacement therapy.    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 He was diagnosed with prostate cancer in 2008. Allison Clark has three children from a prior marriage, a  son who works as an Conservation officer, nature, and  twins, one who is in Biomedical scientist and the other one works in a nursery.  They are all married.  She has three grandchildren. Allison Clark has two children from a prior marriage and one grandson.  This large extended family is all in the area.  The patient is a member of the [employer].    ADVANCED DIRECTIVES: in place  HEALTH MAINTENANCE: History  Substance Use Topics  . Smoking status: Never Smoker   . Smokeless tobacco: Not on file  . Alcohol Use: No     Colonoscopy:  PAP:  Bone density:  Lipid panel:  Allergies  Allergen Reactions  . Avelox [Moxifloxacin Hcl In  Nacl] Hives  . Sulfonamide Derivatives     REACTION: rash    Current Outpatient Prescriptions  Medication Sig Dispense Refill  . Alum & Mag Hydroxide-Simeth (MAGIC MOUTHWASH) SOLN Take 5 mLs by mouth 3 (three) times daily as needed.  15 mL  0  . aspirin 81 MG tablet Take 81 mg by mouth daily.      . beclomethasone (QVAR) 80 MCG/ACT inhaler Inhale 2 puffs into the lungs 2 (two) times daily.       Marland Kitchen BENICAR 20 MG tablet Take 20 mg by mouth daily.        Marland Kitchen dexlansoprazole (DEXILANT) 60 MG capsule Take 60 mg by mouth daily.      . fish oil-omega-3 fatty acids 1000 MG capsule Take 2 g by mouth daily.      . fluticasone (FLONASE) 50 MCG/ACT nasal spray Place 2 sprays into the nose daily.      Marland Kitchen levalbuterol (XOPENEX HFA) 45 MCG/ACT inhaler Inhale 1-2 puffs into the lungs every 4 (four) hours as needed. Shortness of breath      . levothyroxine (SYNTHROID, LEVOTHROID) 112 MCG tablet Take 112 mcg by mouth daily.      Marland Kitchen loratadine (CLARITIN) 10 MG tablet Take 10 mg by mouth daily.      . montelukast (SINGULAIR) 10 MG tablet Take 10 mg by mouth daily.       . Multiple Vitamin (MULTIVITAMIN WITH MINERALS) TABS Take 1 tablet by mouth daily.      . ranitidine (ZANTAC) 300 MG tablet Take 300 mg by mouth at bedtime.      . tamoxifen (NOLVADEX) 20 MG tablet Take 1 tablet (20 mg total) by mouth daily.  90 tablet  12   No current facility-administered medications for this visit.    OBJECTIVE: Middle-aged white woman in no acute distress Filed Vitals:   12/13/13 1516  BP: 114/72  Pulse: 106  Temp: 98.6 F (37 C)  Resp: 18     Body mass index is 29.97 kg/(m^2).    ECOG FS: 0  Sclerae unicteric, pupils equal and round Oropharynx clear and moist No cervical or supraclavicular adenopathy Lungs no rales or rhonchi Heart regular rate and rhythm Abd soft, obese, nontender, positive bowel sounds MSK no focal spinal tenderness, no upper extremity lymphedema Neuro: nonfocal, well oriented, pleasant affect Breasts: right breast no suspicious masses; left breast status post lumpectomy and radiation; no evidence of local recurrence. The left axilla is benign  LAB RESULTS: Lab Results  Component Value Date   WBC 11.7* 12/06/2013   NEUTROABS 7.9* 12/06/2013   HGB 12.8 12/06/2013   HCT 39.0 12/06/2013   MCV 87.9 12/06/2013   PLT 230 12/06/2013      Chemistry      Component Value Date/Time   NA 140 12/06/2013 1534   NA 141 06/22/2012 0408   K 4.0 12/06/2013  1534   K 3.2* 06/22/2012 0408   CL 107 01/27/2013 1533   CL 107 06/22/2012 0408   CO2 21* 12/06/2013 1534   CO2 23 06/22/2012 0408   BUN 20.7 12/06/2013 1534   BUN 13 06/22/2012 0408   CREATININE 0.8 12/06/2013 1534   CREATININE 1.33* 06/22/2012 0408      Component Value Date/Time   CALCIUM 9.1 12/06/2013 1534   CALCIUM 8.1* 06/22/2012 0408   ALKPHOS 50 12/06/2013 1534   ALKPHOS 33* 06/19/2012 0430   AST 13 12/06/2013 1534   AST 20 06/19/2012 0430   ALT 12 12/06/2013 1534  ALT 10 06/19/2012 0430   BILITOT <0.20 12/06/2013 1534   BILITOT 0.3 06/19/2012 0430       Lab Results  Component Value Date   LABCA2 9 01/17/2011    No components found with this basename: [id number]    No results found for this basename: INR,  in the last 168 hours  Urinalysis    Component Value Date/Time   COLORURINE YELLOW 06/14/2012 1547    STUDIES: DIGITAL SCREENING BILATERAL MAMMOGRAM WITH CAD 07/27/2013 COMPARISON: Previous exam(s).  ACR Breast Density Category b: There are scattered areas of  fibroglandular density.  FINDINGS:  There are no findings suspicious for malignancy. Images were  processed with CAD.  IMPRESSION:  No mammographic evidence of malignancy. A result letter of this  screening mammogram will be mailed directly to the patient.  RECOMMENDATION:  Screening mammogram in one year. (Code:SM-B-01Y)  BI-RADS CATEGORY 2: Benign Finding(s)  Electronically Signed  By: Abelardo Diesel M.D.  On: 07/27/2013 14:02  ASSESSMENT:   63 y.o. [place], status post left lumpectomy and sentinel lymph node dissection August 2004 for a T2 N0, stage IIA invasive lobular carcinoma, grade 1,ER and PR positive, HER2/neu negative  (1) treated adjuvantly with weekly Taxol times twelve  (2) followed by adjuvant radiation  (3) status post five years of Arimidex completed in February 2010, at which point she switched to tamoxifen, completing 10 years of anti-estrogen therapy March 2015  PLAN: She terrific  as far as her breast cancer is concerned and she "graduated" today.. I have encouraged her to start an exercise program, now that she has more time, and the goal would be 45 minutes 5 times a week. Since Allison Clark is having diabetes problems, this would be a great joint effort for them to attempt.  Aside from that she understands she will need a yearly physician breast exam, which she plans to obtain under Dr. Ulanda Edison, and a yearly mammogram. We will always be glad to see [place] if you do problem arises, but as of now we are making no further routine appointment for her here.    Elsbeth Yearick C    12/13/2013

## 2013-12-14 ENCOUNTER — Telehealth: Payer: Self-pay | Admitting: Oncology

## 2013-12-14 NOTE — Telephone Encounter (Signed)
Sent letter to Dr. Crist Infante office from Dr. Jana Hakim.

## 2013-12-14 NOTE — Addendum Note (Signed)
Addended by: Laureen Abrahams on: 12/14/2013 05:26 PM   Modules accepted: Orders, Medications

## 2014-08-01 ENCOUNTER — Other Ambulatory Visit: Payer: Self-pay

## 2014-08-01 DIAGNOSIS — Z1239 Encounter for other screening for malignant neoplasm of breast: Secondary | ICD-10-CM

## 2014-08-23 ENCOUNTER — Ambulatory Visit
Admission: RE | Admit: 2014-08-23 | Discharge: 2014-08-23 | Disposition: A | Discharge status: Home / Self Care | Payer: 59 | Source: Ambulatory Visit

## 2014-08-23 ENCOUNTER — Encounter (INDEPENDENT_AMBULATORY_CARE_PROVIDER_SITE_OTHER): Payer: Self-pay

## 2014-08-23 DIAGNOSIS — Z1239 Encounter for other screening for malignant neoplasm of breast: Secondary | ICD-10-CM

## 2015-04-11 DIAGNOSIS — L919 Hypertrophic disorder of the skin, unspecified: Secondary | ICD-10-CM | POA: Insufficient documentation

## 2015-04-11 HISTORY — DX: Hypertrophic disorder of the skin, unspecified: L91.9

## 2015-07-17 ENCOUNTER — Encounter: Payer: Self-pay | Admitting: Allergy and Immunology

## 2015-07-17 ENCOUNTER — Ambulatory Visit (INDEPENDENT_AMBULATORY_CARE_PROVIDER_SITE_OTHER): Payer: 59 | Admitting: Allergy and Immunology

## 2015-07-17 VITALS — BP 102/70 | HR 88 | Temp 98.1°F | Resp 16 | Ht 64.0 in | Wt 185.2 lb

## 2015-07-17 DIAGNOSIS — J3089 Other allergic rhinitis: Secondary | ICD-10-CM | POA: Diagnosis not present

## 2015-07-17 DIAGNOSIS — J387 Other diseases of larynx: Secondary | ICD-10-CM | POA: Diagnosis not present

## 2015-07-17 DIAGNOSIS — J4530 Mild persistent asthma, uncomplicated: Secondary | ICD-10-CM | POA: Insufficient documentation

## 2015-07-17 DIAGNOSIS — J4531 Mild persistent asthma with (acute) exacerbation: Secondary | ICD-10-CM

## 2015-07-17 DIAGNOSIS — K219 Gastro-esophageal reflux disease without esophagitis: Secondary | ICD-10-CM

## 2015-07-17 HISTORY — DX: Gastro-esophageal reflux disease without esophagitis: K21.9

## 2015-07-17 HISTORY — DX: Mild persistent asthma, uncomplicated: J45.30

## 2015-07-17 MED ORDER — IPRATROPIUM-ALBUTEROL 0.5-2.5 (3) MG/3ML IN SOLN: 3.0000 mL | Freq: Four times a day (QID) | RESPIRATORY_TRACT | Status: DC

## 2015-07-17 MED ORDER — IPRATROPIUM BROMIDE 0.02 % IN SOLN: 0.5000 mg | Freq: Once | RESPIRATORY_TRACT | Status: DC

## 2015-07-17 MED ORDER — RANITIDINE HCL 300 MG PO TABS: 300.0000 mg | ORAL_TABLET | Freq: Every day | ORAL | Status: DC

## 2015-07-17 MED ORDER — IPRATROPIUM-ALBUTEROL 0.5-2.5 (3) MG/3ML IN SOLN: 3.0000 mL | Freq: Once | RESPIRATORY_TRACT | Status: DC

## 2015-07-17 MED ORDER — ALBUTEROL SULFATE (2.5 MG/3ML) 0.083% IN NEBU: 2.5000 mg | INHALATION_SOLUTION | Freq: Once | RESPIRATORY_TRACT | Status: DC

## 2015-07-17 MED ORDER — METHYLPREDNISOLONE ACETATE PF 80 MG/ML IJ SUSP
80.0000 mg | Freq: Once | INTRAMUSCULAR | Status: AC
Start: 2015-07-17 — End: 2015-07-17
  Administered 2015-07-17: 80 mg via INTRAMUSCULAR

## 2015-07-17 NOTE — Progress Notes (Signed)
Follow-up Note  Subjective  Allison Clark is a 64 y.o. female who returns to [facility] in re-evaluation of the following:  Problem  Mild Persistent Asthma   Presents today with a three day history of wheezing, coughing, irritated throat, and SABA use as a result of Cat exposure on Friday. No chills, no fever, some sputum that may be slightly tinged yellow color on rare occasion. Was doing wonderful with any exacerbation in a year and minimal requirement for a bronchodilator until this event.   Laryngopharyngeal Reflux (Lpr)   Has been doing well while using just Dexilant 60mg  daily without any ranitidine in the evening. However since her flare of asthma she has has some reflux event up to her chest.    Other Allergic Rhinitis   Stable AR without much problem this year while using her flonase intermittently      Past Medical History  Diagnosis Date  . Breast cancer ([place])   . Thyroid disease   . Asthma   . BBB (bundle branch block)     left  . GERD (gastroesophageal reflux disease)     Past Surgical History  Procedure Laterality Date  . Breast surgery    . Tonsillectomy    . Thyroidectomy    . Sterilization    . Tubal ligation       Current outpatient prescriptions:  .  aspirin 81 MG tablet, Take 81 mg by mouth daily., Disp: , Rfl:  .  beclomethasone (QVAR) 80 MCG/ACT inhaler, Inhale 2 puffs into the lungs 2 (two) times daily. , Disp: , Rfl:  .  BENICAR 20 MG tablet, Take 20 mg by mouth daily. , Disp: , Rfl:  .  dexlansoprazole (DEXILANT) 60 MG capsule, Take 60 mg by mouth daily., Disp: , Rfl:  .  fish oil-omega-3 fatty acids 1000 MG capsule, Take 2 g by mouth daily., Disp: , Rfl:  .  fluticasone (FLONASE) 50 MCG/ACT nasal spray, Place 2 sprays into the nose daily., Disp: , Rfl:  .  levalbuterol (XOPENEX HFA) 45 MCG/ACT inhaler, Inhale 1-2 puffs into the lungs every 4 (four) hours as needed. Shortness of breath, Disp: , Rfl:  .  levothyroxine  (SYNTHROID, LEVOTHROID) 112 MCG tablet, Take 112 mcg by mouth daily., Disp: , Rfl:  .  loratadine (CLARITIN) 10 MG tablet, Take 10 mg by mouth daily., Disp: , Rfl:  .  simvastatin (ZOCOR) 20 MG tablet, Take 20 mg by mouth daily., Disp: , Rfl:  .  Alum & Mag Hydroxide-Simeth (MAGIC MOUTHWASH) SOLN, Take 5 mLs by mouth 3 (three) times daily as needed. (Patient not taking: Reported on 07/17/2015), Disp: 15 mL, Rfl: 0 .  ranitidine (ZANTAC) 300 MG tablet, Take 1 tablet (300 mg total) by mouth at bedtime., Disp: 30 tablet, Rfl: 5  Current facility-administered medications:  .  ipratropium-albuterol (DUONEB) 0.5-2.5 (3) MG/3ML nebulizer solution 3 mL, 3 mL, Nebulization, Q6H, Jiles Prows, MD .  methylPREDNISolone acetate PF (DEPO-MEDROL) injection 80 mg, 80 mg, Intramuscular, Once, Jiles Prows, MD  Meds ordered this encounter  Medications  . simvastatin (ZOCOR) 20 MG tablet    Sig: Take 20 mg by mouth daily.  Marland Kitchen ipratropium-albuterol (DUONEB) 0.5-2.5 (3) MG/3ML nebulizer solution 3 mL    Sig:   . methylPREDNISolone acetate PF (DEPO-MEDROL) injection 80 mg    Sig:   . ranitidine (ZANTAC) 300 MG tablet    Sig: Take 1 tablet (300 mg total) by mouth at bedtime.  Dispense:  30 tablet    Refill:  5    Allergies  Allergen Reactions  . Avelox [Moxifloxacin Hcl In Nacl] Hives  . Sulfonamide Derivatives     REACTION: rash    ROS   Objective:   Filed Vitals:   07/17/15 1130  BP: 102/70  Pulse: 88  Temp: 98.1 F (36.7 C)  Resp: 16    Physical Exam  Diagnostics:    Spirometry was performed and demonstrated an FEV1 of 1.50 @ 64% predicted.  The patient had an Asthma Control Test with the following results: ACT Total Score: 20.    Assessment and Plan:     Problem List Items Addressed This Visit      Respiratory   Mild persistent asthma - Primary   Relevant Medications   ipratropium-albuterol (DUONEB) 0.5-2.5 (3) MG/3ML nebulizer solution 3 mL (Start on 07/17/2015  2:00 PM)    methylPREDNISolone acetate PF (DEPO-MEDROL) injection 80 mg   Other Relevant Orders   Spirometry with Graph   Laryngopharyngeal reflux (LPR)   Relevant Medications   ranitidine (ZANTAC) 300 MG tablet   Other allergic rhinitis      Patient Instructions   1. Nebulizer with Duoneb now  2. Depomedrol 80 now  3. Increase Qvar to 3 puffs three times per day (9) while "sick"  4. Continue Dexilant and Ranitidine  5. Continue Flonase, Claritin, and Xopenex.  6. May add in Mucinex DM and ibuprofen  7. When better get flu vaccine.  8. Contact me if problem

## 2015-07-17 NOTE — Patient Instructions (Signed)
  1. Nebulizer with Duoneb now  2. Depomedrol 80 now  3. Increase Qvar to 3 puffs three times per day (9) while "sick"  4. Continue Dexilant and Ranitidine  5. Continue Flonase, Claritin, and Xopenex.  6. May add in Mucinex DM and ibuprofen  7. When better get flu vaccine.  8. Contact me if problem

## 2015-08-07 ENCOUNTER — Other Ambulatory Visit: Payer: Self-pay | Admitting: *Deleted

## 2015-08-07 MED ORDER — DEXLANSOPRAZOLE 60 MG PO CPDR: 60.0000 mg | DELAYED_RELEASE_CAPSULE | Freq: Every day | ORAL | Status: DC

## 2015-08-11 ENCOUNTER — Other Ambulatory Visit: Payer: Self-pay

## 2015-08-11 DIAGNOSIS — Z1231 Encounter for screening mammogram for malignant neoplasm of breast: Secondary | ICD-10-CM

## 2015-09-05 ENCOUNTER — Ambulatory Visit
Admission: RE | Admit: 2015-09-05 | Discharge: 2015-09-05 | Disposition: A | Discharge status: Home / Self Care | Payer: 59 | Source: Ambulatory Visit

## 2015-09-05 ENCOUNTER — Ambulatory Visit: Payer: Self-pay

## 2015-09-05 DIAGNOSIS — Z1231 Encounter for screening mammogram for malignant neoplasm of breast: Secondary | ICD-10-CM

## 2015-11-15 ENCOUNTER — Other Ambulatory Visit: Payer: Self-pay

## 2015-11-15 MED ORDER — DEXLANSOPRAZOLE 60 MG PO CPDR: 60.0000 mg | DELAYED_RELEASE_CAPSULE | Freq: Every day | ORAL | Status: DC

## 2015-11-24 DIAGNOSIS — Z124 Encounter for screening for malignant neoplasm of cervix: Secondary | ICD-10-CM | POA: Diagnosis not present

## 2015-11-24 DIAGNOSIS — Z130 Encounter for screening for diseases of the blood and blood-forming organs and certain disorders involving the immune mechanism: Secondary | ICD-10-CM | POA: Diagnosis not present

## 2015-11-24 DIAGNOSIS — Z01419 Encounter for gynecological examination (general) (routine) without abnormal findings: Secondary | ICD-10-CM | POA: Diagnosis not present

## 2015-11-24 DIAGNOSIS — Z1389 Encounter for screening for other disorder: Secondary | ICD-10-CM | POA: Diagnosis not present

## 2015-11-27 ENCOUNTER — Other Ambulatory Visit: Payer: Self-pay

## 2015-11-27 MED ORDER — BECLOMETHASONE DIPROPIONATE 80 MCG/ACT IN AERS: 2.0000 | INHALATION_SPRAY | Freq: Two times a day (BID) | RESPIRATORY_TRACT | Status: DC

## 2015-12-18 DIAGNOSIS — I10 Essential (primary) hypertension: Secondary | ICD-10-CM | POA: Diagnosis not present

## 2015-12-18 DIAGNOSIS — E038 Other specified hypothyroidism: Secondary | ICD-10-CM | POA: Diagnosis not present

## 2015-12-18 DIAGNOSIS — M859 Disorder of bone density and structure, unspecified: Secondary | ICD-10-CM | POA: Diagnosis not present

## 2015-12-18 DIAGNOSIS — Z0000 Encounter for general adult medical examination without abnormal findings: Secondary | ICD-10-CM

## 2015-12-18 HISTORY — DX: Encounter for general adult medical examination without abnormal findings: Z00.00

## 2015-12-25 DIAGNOSIS — E038 Other specified hypothyroidism: Secondary | ICD-10-CM | POA: Diagnosis not present

## 2015-12-25 DIAGNOSIS — E784 Other hyperlipidemia: Secondary | ICD-10-CM | POA: Diagnosis not present

## 2015-12-25 DIAGNOSIS — M859 Disorder of bone density and structure, unspecified: Secondary | ICD-10-CM | POA: Diagnosis not present

## 2015-12-25 DIAGNOSIS — A047 Enterocolitis due to Clostridium difficile: Secondary | ICD-10-CM | POA: Diagnosis not present

## 2015-12-25 DIAGNOSIS — Z0000 Encounter for general adult medical examination without abnormal findings: Secondary | ICD-10-CM | POA: Diagnosis not present

## 2015-12-25 DIAGNOSIS — I10 Essential (primary) hypertension: Secondary | ICD-10-CM | POA: Diagnosis not present

## 2015-12-25 DIAGNOSIS — J45909 Unspecified asthma, uncomplicated: Secondary | ICD-10-CM | POA: Diagnosis not present

## 2015-12-25 DIAGNOSIS — E668 Other obesity: Secondary | ICD-10-CM | POA: Diagnosis not present

## 2015-12-25 DIAGNOSIS — Z6832 Body mass index (BMI) 32.0-32.9, adult: Secondary | ICD-10-CM | POA: Diagnosis not present

## 2015-12-25 DIAGNOSIS — C50919 Malignant neoplasm of unspecified site of unspecified female breast: Secondary | ICD-10-CM | POA: Diagnosis not present

## 2015-12-25 DIAGNOSIS — I493 Ventricular premature depolarization: Secondary | ICD-10-CM | POA: Diagnosis not present

## 2015-12-25 DIAGNOSIS — K219 Gastro-esophageal reflux disease without esophagitis: Secondary | ICD-10-CM | POA: Diagnosis not present

## 2015-12-25 DIAGNOSIS — Z1389 Encounter for screening for other disorder: Secondary | ICD-10-CM | POA: Diagnosis not present

## 2016-01-01 DIAGNOSIS — Z1212 Encounter for screening for malignant neoplasm of rectum: Secondary | ICD-10-CM | POA: Diagnosis not present

## 2016-01-15 ENCOUNTER — Ambulatory Visit (INDEPENDENT_AMBULATORY_CARE_PROVIDER_SITE_OTHER): Payer: PPO | Admitting: Allergy and Immunology

## 2016-01-15 ENCOUNTER — Encounter: Payer: Self-pay | Admitting: Allergy and Immunology

## 2016-01-15 VITALS — BP 110/68 | HR 84 | Resp 20

## 2016-01-15 DIAGNOSIS — J387 Other diseases of larynx: Secondary | ICD-10-CM | POA: Diagnosis not present

## 2016-01-15 DIAGNOSIS — J4540 Moderate persistent asthma, uncomplicated: Secondary | ICD-10-CM | POA: Diagnosis not present

## 2016-01-15 DIAGNOSIS — J3089 Other allergic rhinitis: Secondary | ICD-10-CM

## 2016-01-15 DIAGNOSIS — K219 Gastro-esophageal reflux disease without esophagitis: Secondary | ICD-10-CM

## 2016-01-15 MED ORDER — RANITIDINE HCL 300 MG PO TABS: ORAL_TABLET | ORAL | Status: DC

## 2016-01-15 MED ORDER — FLUTICASONE FUROATE 200 MCG/ACT IN AEPB: 1.0000 | INHALATION_SPRAY | Freq: Every day | RESPIRATORY_TRACT | Status: DC

## 2016-01-15 MED ORDER — DEXLANSOPRAZOLE 60 MG PO CPDR
DELAYED_RELEASE_CAPSULE | ORAL | Status: DC
Start: 2016-01-15 — End: 2016-07-22

## 2016-01-15 NOTE — Progress Notes (Signed)
Follow-up Note  Referring Provider: Crist Infante, MD Primary Provider: Jerlyn Ly, MD Date of Office Visit: 01/15/2016  Subjective:   Allison Clark (DOB: 12/16/1950) is a 65 y.o. female who returns to the [facility] on 01/15/2016 in re-evaluation of the following:  HPI Comments: Allison Clark returns to this clinic in evaluation of her asthma and LPR and allergic rhinitis. It is been 6 months since I've last seen her in this clinic.  Her asthma has been under excellent control without the need for a systemic steroid over the past 6 months to treat an exacerbation of asthma, minimal requirement for a short acting bronchodilator less than 1 time per week, and no impediment to exercise. She unfortunately must change her Qvar to some other product that is on her insurance formulary as this agent is not covered.  Her nose is been doing very well while intermittently using Flonase may be averaging out to her twice a week. She's not had any sinus infections in the past 6 months.  Her reflexes been under good control but she has determined that she does require a combination of Dexilant plus ranitidine. If she eliminates ranitidine she does get reflux events up high in her chest.     Medication List           aspirin 81 MG tablet  Take 81 mg by mouth daily.     beclomethasone 80 MCG/ACT inhaler  Commonly known as:  QVAR  Inhale 2 puffs into the lungs 2 (two) times daily.     BENICAR 20 MG tablet  Generic drug:  olmesartan  Take 20 mg by mouth daily.     dexlansoprazole 60 MG capsule  Commonly known as:  DEXILANT  Take 1 capsule (60 mg total) by mouth daily.     fish oil-omega-3 fatty acids 1000 MG capsule  Take 2 g by mouth daily.     fluticasone 50 MCG/ACT nasal spray  Commonly known as:  FLONASE  Place 2 sprays into the nose daily.     levalbuterol 45 MCG/ACT inhaler  Commonly known as:  XOPENEX HFA  Inhale 1-2 puffs into the lungs every 4 (four) hours as  needed. Shortness of breath     levothyroxine 112 MCG tablet  Commonly known as:  SYNTHROID, LEVOTHROID  Take 112 mcg by mouth daily.     loratadine 10 MG tablet  Commonly known as:  CLARITIN  Take 10 mg by mouth daily.     ranitidine 300 MG tablet  Commonly known as:  ZANTAC  Take 1 tablet (300 mg total) by mouth at bedtime.     simvastatin 20 MG tablet  Commonly known as:  ZOCOR  Take 20 mg by mouth daily.        Past Medical History  Diagnosis Date  . Breast cancer ([place])   . Thyroid disease   . Asthma   . BBB (bundle branch block)     left  . GERD (gastroesophageal reflux disease)     Past Surgical History  Procedure Laterality Date  . Breast surgery    . Tonsillectomy    . Thyroidectomy    . Sterilization    . Tubal ligation      Allergies  Allergen Reactions  . Avelox [Moxifloxacin Hcl In Nacl] Hives  . Sulfonamide Derivatives     REACTION: rash    Review of systems negative except as noted in HPI / PMHx or noted below:  Review of  Systems  Constitutional: Negative.   HENT: Negative.   Eyes: Negative.   Respiratory: Negative.   Cardiovascular: Negative.   Gastrointestinal: Negative.   Genitourinary: Negative.   Musculoskeletal: Negative.   Skin: Negative.   Neurological: Negative.   Endo/Heme/Allergies: Negative.   Psychiatric/Behavioral: Negative.      Objective:   Filed Vitals:   01/15/16 1059  BP: 110/68  Pulse: 84  Resp: 20          Physical Exam  Constitutional: She is well-developed, well-nourished, and in no distress.  HENT:  Head: Normocephalic.  Right Ear: Tympanic membrane, external ear and ear canal normal.  Left Ear: Tympanic membrane, external ear and ear canal normal.  Nose: Nose normal. No mucosal edema or rhinorrhea.  Mouth/Throat: Uvula is midline, oropharynx is clear and moist and mucous membranes are normal. No oropharyngeal exudate.  Eyes: Conjunctivae are normal.  Neck: Trachea normal. No tracheal tenderness  present. No tracheal deviation present. No thyromegaly present.  Cardiovascular: Normal rate, regular rhythm, S1 normal, S2 normal and normal heart sounds.   No murmur heard. Pulmonary/Chest: Breath sounds normal. No stridor. No respiratory distress. She has no wheezes. She has no rales.  Musculoskeletal: She exhibits no edema.  Lymphadenopathy:       Head (right side): No tonsillar adenopathy present.       Head (left side): No tonsillar adenopathy present.    She has no cervical adenopathy.  Neurological: She is alert. Gait normal.  Skin: No rash noted. She is not diaphoretic. No erythema. Nails show no clubbing.  Psychiatric: Mood and affect normal.    Diagnostics:    Spirometry was performed and demonstrated an FEV1 of 1.57 at 65 % of predicted.  The patient had an Asthma Control Test with the following results: ACT Total Score: 22.    Assessment and Plan:   1. Asthma, moderate persistent, well-controlled   2. Laryngopharyngeal reflux (LPR)   3. Other allergic rhinitis     1. Change Qvar to Arnuity 200 one time per day   2. Continue Dexilant 60mg  in AM + Ranitidine 300mg  in PM  3. Continue Flonase, Claritin, and Xopenex.  5. Return in 6 months or earlier if problem   Hopefully Allison Clark will do well with her medical therapy as we need to change her inhaled steroid based upon her insurance coverage. Certainly she does require rather aggressive therapy for reflux and we'll continue her on a combination of Dexilant and ranitidine. I will see her back in this clinic in 6 months or earlier if there is a problem.  [name], MD [place]

## 2016-01-15 NOTE — Patient Instructions (Signed)
  1. Change Qvar to Arnuity 200 one time per day   2. Continue Dexilant 60mg  in AM + Ranitidine 300mg  in PM  3. Continue Flonase, Claritin, and Xopenex.  5. Return in 6 months or earlier if problem

## 2016-05-15 DIAGNOSIS — R1032 Left lower quadrant pain: Secondary | ICD-10-CM | POA: Insufficient documentation

## 2016-05-15 DIAGNOSIS — M25552 Pain in left hip: Secondary | ICD-10-CM | POA: Insufficient documentation

## 2016-05-15 DIAGNOSIS — M25669 Stiffness of unspecified knee, not elsewhere classified: Secondary | ICD-10-CM | POA: Diagnosis not present

## 2016-05-15 DIAGNOSIS — Z6832 Body mass index (BMI) 32.0-32.9, adult: Secondary | ICD-10-CM | POA: Diagnosis not present

## 2016-05-15 HISTORY — DX: Pain in left hip: M25.552

## 2016-05-15 HISTORY — DX: Left lower quadrant pain: R10.32

## 2016-05-21 DIAGNOSIS — H524 Presbyopia: Secondary | ICD-10-CM | POA: Diagnosis not present

## 2016-05-21 DIAGNOSIS — H2513 Age-related nuclear cataract, bilateral: Secondary | ICD-10-CM | POA: Diagnosis not present

## 2016-05-21 DIAGNOSIS — H52223 Regular astigmatism, bilateral: Secondary | ICD-10-CM | POA: Diagnosis not present

## 2016-05-21 DIAGNOSIS — H5203 Hypermetropia, bilateral: Secondary | ICD-10-CM | POA: Diagnosis not present

## 2016-05-21 DIAGNOSIS — H40013 Open angle with borderline findings, low risk, bilateral: Secondary | ICD-10-CM | POA: Diagnosis not present

## 2016-06-11 DIAGNOSIS — M1612 Unilateral primary osteoarthritis, left hip: Secondary | ICD-10-CM | POA: Diagnosis not present

## 2016-06-11 DIAGNOSIS — M25552 Pain in left hip: Secondary | ICD-10-CM | POA: Diagnosis not present

## 2016-06-29 DIAGNOSIS — Z23 Encounter for immunization: Secondary | ICD-10-CM | POA: Diagnosis not present

## 2016-07-22 ENCOUNTER — Ambulatory Visit (INDEPENDENT_AMBULATORY_CARE_PROVIDER_SITE_OTHER): Payer: PPO | Admitting: Allergy and Immunology

## 2016-07-22 ENCOUNTER — Encounter: Payer: Self-pay | Admitting: Allergy and Immunology

## 2016-07-22 ENCOUNTER — Encounter (INDEPENDENT_AMBULATORY_CARE_PROVIDER_SITE_OTHER): Payer: Self-pay

## 2016-07-22 VITALS — BP 142/82 | HR 80 | Resp 18

## 2016-07-22 DIAGNOSIS — J3089 Other allergic rhinitis: Secondary | ICD-10-CM

## 2016-07-22 DIAGNOSIS — K219 Gastro-esophageal reflux disease without esophagitis: Secondary | ICD-10-CM

## 2016-07-22 DIAGNOSIS — J4530 Mild persistent asthma, uncomplicated: Secondary | ICD-10-CM | POA: Diagnosis not present

## 2016-07-22 MED ORDER — DEXLANSOPRAZOLE 60 MG PO CPDR: DELAYED_RELEASE_CAPSULE | ORAL | 1 refills | Status: DC

## 2016-07-22 MED ORDER — RANITIDINE HCL 300 MG PO TABS: ORAL_TABLET | ORAL | 1 refills | Status: DC

## 2016-07-22 MED ORDER — FLUTICASONE FUROATE 200 MCG/ACT IN AEPB: 1.0000 | INHALATION_SPRAY | Freq: Every day | RESPIRATORY_TRACT | 1 refills | Status: DC

## 2016-07-22 NOTE — Progress Notes (Signed)
Follow-up Note  Referring Provider: Crist Infante, MD Primary Provider: Jerlyn Ly, MD Date of Office Visit: 07/22/2016  Subjective:   Allison Clark (DOB: 16-Jan-1951) is a 65 y.o. female who returns to the [facility] on 07/22/2016 in re-evaluation of the following:  HPI: Allison Clark presents to this clinic in reevaluation of her asthma and LPR and allergic rhinitis. I last saw her in his clinic in April 2017.  During the interval she has done wonderful with her asthma. She has not required a systemic steroid to treat an exacerbation. She has not used a short-acting bronchodilator and she can exert herself without problem. She has been consistently using her Arnuity  Her nose has really been doing quite well. She has not required an antibiotic to treat an episode of sinusitis. She has been consistently using her nasal steroid  Reflux appears to be under excellent control using Dexilant and ranitidine.    Medication List      aspirin 81 MG tablet Take 81 mg by mouth daily.   BENICAR 20 MG tablet Generic drug:  olmesartan Take 20 mg by mouth daily.   dexlansoprazole 60 MG capsule Commonly known as:  DEXILANT TAKE ONE CAPSULE ONCE DAILY   fish oil-omega-3 fatty acids 1000 MG capsule Take 2 g by mouth daily.   fluticasone 50 MCG/ACT nasal spray Commonly known as:  FLONASE Place 2 sprays into the nose daily.   Fluticasone Furoate 200 MCG/ACT Aepb Commonly known as:  ARNUITY ELLIPTA Inhale 1 puff into the lungs daily.   levalbuterol 45 MCG/ACT inhaler Commonly known as:  XOPENEX HFA Inhale 1-2 puffs into the lungs every 4 (four) hours as needed. Shortness of breath   levothyroxine 112 MCG tablet Commonly known as:  SYNTHROID, LEVOTHROID Take 112 mcg by mouth daily.   loratadine 10 MG tablet Commonly known as:  CLARITIN Take 10 mg by mouth daily.   ranitidine 300 MG tablet Commonly known as:  ZANTAC TAKE ONE TABLET EACH EVENING   simvastatin 20  MG tablet Commonly known as:  ZOCOR Take 20 mg by mouth daily.       Past Medical History:  Diagnosis Date  . Asthma   . BBB (bundle branch block)    left  . Breast cancer ([place])   . GERD (gastroesophageal reflux disease)   . Thyroid disease     Past Surgical History:  Procedure Laterality Date  . BREAST SURGERY    . STERILIZATION    . THYROIDECTOMY    . TONSILLECTOMY    . TUBAL LIGATION      Allergies  Allergen Reactions  . Avelox [Moxifloxacin Hcl In Nacl] Hives  . Sulfonamide Derivatives     REACTION: rash    Review of systems negative except as noted in HPI / PMHx or noted below:  Review of Systems  Constitutional: Negative.   HENT: Negative.   Eyes: Negative.   Respiratory: Negative.   Cardiovascular: Negative.   Gastrointestinal: Negative.   Genitourinary: Negative.   Musculoskeletal: Negative.   Skin: Negative.   Neurological: Negative.   Endo/Heme/Allergies: Negative.   Psychiatric/Behavioral: Negative.      Objective:   Vitals:   07/22/16 1116  BP: (!) 142/82  Pulse: 80  Resp: 18          Physical Exam  Constitutional: She is well-developed, well-nourished, and in no distress.  HENT:  Head: Normocephalic.  Right Ear: Tympanic membrane, external ear and ear canal normal.  Left Ear:  Tympanic membrane, external ear and ear canal normal.  Nose: Nose normal. No mucosal edema or rhinorrhea.  Mouth/Throat: Uvula is midline, oropharynx is clear and moist and mucous membranes are normal. No oropharyngeal exudate.  Eyes: Conjunctivae are normal.  Neck: Trachea normal. No tracheal tenderness present. No tracheal deviation present. No thyromegaly present.  Cardiovascular: Normal rate, regular rhythm, S1 normal, S2 normal and normal heart sounds.   No murmur heard. Pulmonary/Chest: Breath sounds normal. No stridor. No respiratory distress. She has no wheezes. She has no rales.  Musculoskeletal: She exhibits no edema.  Lymphadenopathy:       Head  (right side): No tonsillar adenopathy present.       Head (left side): No tonsillar adenopathy present.    She has no cervical adenopathy.  Neurological: She is alert. Gait normal.  Skin: No rash noted. She is not diaphoretic. No erythema. Nails show no clubbing.  Psychiatric: Mood and affect normal.    Diagnostics:    Spirometry was performed and demonstrated an FEV1 of 1.65 at 68 % of predicted.  The patient had an Asthma Control Test with the following results: ACT Total Score: 21.    Assessment and Plan:   1. Mild persistent asthma without complication   2. Laryngopharyngeal reflux (LPR)   3. Other allergic rhinitis     1. Decrease Arnuity 200 to Monday through Friday use one time per day.  2. "Action plan" for asthma flare up:   A. increase Arnuity to 2 inhalations twice a day  B. use Xopenex HFA if needed  3. Continue Dexilant 60mg  in AM + Ranitidine 300mg  in PM  4. Continue Flonase, and Claritin  5. Return in 6 months or earlier if problem  Allison Clark has really done quite well regarding her asthma and I will see if we can lower her dose of inhaled steroid by 20% as noted above. I given her an action plan to initiate should she develop an asthma flare. She'll continue on therapy for her upper airways and her reflux as stated above. I'll see her back in this clinic in 6 months or earlier if there is a problem.  [name], MD [place]

## 2016-07-22 NOTE — Patient Instructions (Addendum)
  1. Decrease Arnuity 200 to Monday through Friday use  2. "Action plan" for asthma flare up:   A. increase Arnuity to 2 inhalations twice a day  B. use Xopenex HFA if needed  3. Continue Dexilant 60mg  in AM + Ranitidine 300mg  in PM  4. Continue Flonase, and Claritin  5. Return in 6 months or earlier if problem

## 2016-08-21 ENCOUNTER — Other Ambulatory Visit: Payer: Self-pay | Admitting: Oncology

## 2016-08-21 DIAGNOSIS — Z1231 Encounter for screening mammogram for malignant neoplasm of breast: Secondary | ICD-10-CM

## 2016-09-27 ENCOUNTER — Ambulatory Visit
Admission: RE | Admit: 2016-09-27 | Discharge: 2016-09-27 | Disposition: A | Discharge status: Home / Self Care | Payer: PPO | Source: Ambulatory Visit | Attending: Oncology | Admitting: Oncology

## 2016-09-27 DIAGNOSIS — Z1231 Encounter for screening mammogram for malignant neoplasm of breast: Secondary | ICD-10-CM | POA: Diagnosis not present

## 2016-12-02 DIAGNOSIS — Z01419 Encounter for gynecological examination (general) (routine) without abnormal findings: Secondary | ICD-10-CM | POA: Diagnosis not present

## 2016-12-02 DIAGNOSIS — Z130 Encounter for screening for diseases of the blood and blood-forming organs and certain disorders involving the immune mechanism: Secondary | ICD-10-CM | POA: Diagnosis not present

## 2016-12-02 DIAGNOSIS — Z1389 Encounter for screening for other disorder: Secondary | ICD-10-CM | POA: Diagnosis not present

## 2017-01-20 ENCOUNTER — Encounter: Payer: Self-pay | Admitting: Allergy and Immunology

## 2017-01-20 ENCOUNTER — Ambulatory Visit (INDEPENDENT_AMBULATORY_CARE_PROVIDER_SITE_OTHER): Payer: PPO | Admitting: Allergy and Immunology

## 2017-01-20 VITALS — BP 120/78 | HR 88 | Resp 16

## 2017-01-20 DIAGNOSIS — E038 Other specified hypothyroidism: Secondary | ICD-10-CM | POA: Diagnosis not present

## 2017-01-20 DIAGNOSIS — J3089 Other allergic rhinitis: Secondary | ICD-10-CM

## 2017-01-20 DIAGNOSIS — J4530 Mild persistent asthma, uncomplicated: Secondary | ICD-10-CM | POA: Diagnosis not present

## 2017-01-20 DIAGNOSIS — E784 Other hyperlipidemia: Secondary | ICD-10-CM | POA: Diagnosis not present

## 2017-01-20 DIAGNOSIS — M859 Disorder of bone density and structure, unspecified: Secondary | ICD-10-CM | POA: Diagnosis not present

## 2017-01-20 DIAGNOSIS — K219 Gastro-esophageal reflux disease without esophagitis: Secondary | ICD-10-CM | POA: Diagnosis not present

## 2017-01-20 DIAGNOSIS — I10 Essential (primary) hypertension: Secondary | ICD-10-CM | POA: Diagnosis not present

## 2017-01-20 MED ORDER — LEVALBUTEROL TARTRATE 45 MCG/ACT IN AERO: 1.0000 | INHALATION_SPRAY | RESPIRATORY_TRACT | 1 refills | Status: DC | PRN

## 2017-01-20 MED ORDER — DEXLANSOPRAZOLE 60 MG PO CPDR: DELAYED_RELEASE_CAPSULE | ORAL | 3 refills | Status: DC

## 2017-01-20 NOTE — Patient Instructions (Signed)
  1. Continue Arnuity 200 to Monday through Friday use  2. "Action plan" for asthma flare up:   A. increase Arnuity to 2 inhalations twice a day  B. use Xopenex HFA if needed  3. Continue Dexilant 60mg  in AM + Ranitidine 300mg  in PM  4. Continue Flonase, and Claritin  5. Return in 6 months or earlier if problem

## 2017-01-20 NOTE — Progress Notes (Signed)
Follow-up Note  Referring Provider: Crist Infante, MD Primary Provider: Jerlyn Ly, MD Date of Office Visit: 01/20/2017  Subjective:   Allison Clark (DOB: 1951/03/12) is a 66 y.o. female who returns to the [facility] on 01/20/2017 in re-evaluation of the following:  HPI: Allison Clark returns to this clinic in reevaluation of her asthma and allergic rhinitis and LPR. Her last visit to this clinic was October 2017.  She has really done quite well since I've last seen her in this clinic. She does not use a short acting bronchodilator and she can exercise without any difficulty and she has not required a systemic steroid to treat an exacerbation. She's now using her Arnuity Monday through Friday. Likewise, her nose is really been doing relatively well. She has not required an antibiotic to treat an episode of sinusitis.  Her reflux has been under excellent control using Dexilant and ranitidine. She is now down to 150 mg of ranitidine at night time from her 300 mg dose.  Allergies as of 01/20/2017      Reactions   Avelox [moxifloxacin Hcl In Nacl] Hives   Sulfonamide Derivatives    REACTION: rash      Medication List      aspirin 81 MG tablet Take 81 mg by mouth daily.   BENICAR 20 MG tablet Generic drug:  olmesartan Take 20 mg by mouth daily.   dexlansoprazole 60 MG capsule Commonly known as:  DEXILANT Take one capsule every morning before breakfast   fish oil-omega-3 fatty acids 1000 MG capsule Take 2 g by mouth daily.   fluticasone 50 MCG/ACT nasal spray Commonly known as:  FLONASE Place 2 sprays into the nose daily.   Fluticasone Furoate 200 MCG/ACT Aepb Commonly known as:  ARNUITY ELLIPTA Inhale 1 puff into the lungs daily.   levalbuterol 45 MCG/ACT inhaler Commonly known as:  XOPENEX HFA Inhale 1-2 puffs into the lungs every 4 (four) hours as needed. Shortness of breath   levothyroxine 112 MCG tablet Commonly known as:  SYNTHROID,  LEVOTHROID Take 112 mcg by mouth daily.   loratadine 10 MG tablet Commonly known as:  CLARITIN Take 10 mg by mouth daily.   ranitidine 300 MG tablet Commonly known as:  ZANTAC Take one tablet each evening   simvastatin 20 MG tablet Commonly known as:  ZOCOR Take 20 mg by mouth daily.       Past Medical History:  Diagnosis Date  . Asthma   . BBB (bundle branch block)    left  . Breast cancer ([place])   . GERD (gastroesophageal reflux disease)   . Thyroid disease     Past Surgical History:  Procedure Laterality Date  . BREAST SURGERY    . STERILIZATION    . THYROIDECTOMY    . TONSILLECTOMY    . TUBAL LIGATION      Review of systems negative except as noted in HPI / PMHx or noted below:  Review of Systems  Constitutional: Negative.   HENT: Negative.   Eyes: Negative.   Respiratory: Negative.   Cardiovascular: Negative.   Gastrointestinal: Negative.   Genitourinary: Negative.   Musculoskeletal: Negative.   Skin: Negative.   Neurological: Negative.   Endo/Heme/Allergies: Negative.   Psychiatric/Behavioral: Negative.      Objective:   Vitals:   01/20/17 1106  BP: 120/78  Pulse: 88  Resp: 16          Physical Exam  Constitutional: She is well-developed, well-nourished, and in  no distress.  HENT:  Head: Normocephalic.  Right Ear: Tympanic membrane, external ear and ear canal normal.  Left Ear: Tympanic membrane, external ear and ear canal normal.  Nose: Nose normal. No mucosal edema or rhinorrhea.  Mouth/Throat: Uvula is midline, oropharynx is clear and moist and mucous membranes are normal. No oropharyngeal exudate.  Eyes: Conjunctivae are normal.  Neck: Trachea normal. No tracheal tenderness present. No tracheal deviation present. No thyromegaly present.  Cardiovascular: Normal rate, regular rhythm, S1 normal, S2 normal and normal heart sounds.   No murmur heard. Pulmonary/Chest: Breath sounds normal. No stridor. No respiratory distress. She has no  wheezes. She has no rales.  Musculoskeletal: She exhibits no edema.  Lymphadenopathy:       Head (right side): No tonsillar adenopathy present.       Head (left side): No tonsillar adenopathy present.    She has no cervical adenopathy.  Neurological: She is alert. Gait normal.  Skin: No rash noted. She is not diaphoretic. No erythema. Nails show no clubbing.  Psychiatric: Mood and affect normal.    Diagnostics:    Spirometry was performed and demonstrated an FEV1 of 1.61 at 67 % of predicted.  The patient had an Asthma Control Test with the following results: ACT Total Score: 21.    Assessment and Plan:   1. Mild persistent asthma without complication   2. Other allergic rhinitis   3. Laryngopharyngeal reflux (LPR)     1. Continue Arnuity 200 to Monday through Friday use  2. "Action plan" for asthma flare up:   A. increase Arnuity to 2 inhalations twice a day  B. use Xopenex HFA if needed  3. Continue Dexilant 60mg  in AM + Ranitidine 300mg  in PM  4. Continue Flonase, and Claritin  5. Return in 6 months or earlier if problem  Allison Clark has really done very well on her Arnuity utilized Monday through Friday and we'll keep her on this plan and she will also continue to use Flonase and Claritin and Dexilant and ranitidine as previously prescribed to address her upper airway inflammatory condition and her reflux. If she has difficulty she can contact this clinic for further evaluation but otherwise I'll just see her back in this clinic in 6 months. I did ask her to discuss with her primary care doctor about receiving the Prevnar and the new shingles vaccine.  [name], MD [facility] [place]

## 2017-01-27 DIAGNOSIS — I10 Essential (primary) hypertension: Secondary | ICD-10-CM | POA: Diagnosis not present

## 2017-01-27 DIAGNOSIS — M1990 Unspecified osteoarthritis, unspecified site: Secondary | ICD-10-CM | POA: Diagnosis not present

## 2017-01-27 DIAGNOSIS — Z0000 Encounter for general adult medical examination without abnormal findings: Secondary | ICD-10-CM | POA: Diagnosis not present

## 2017-01-27 DIAGNOSIS — I493 Ventricular premature depolarization: Secondary | ICD-10-CM | POA: Diagnosis not present

## 2017-01-27 DIAGNOSIS — Z6831 Body mass index (BMI) 31.0-31.9, adult: Secondary | ICD-10-CM | POA: Diagnosis not present

## 2017-01-27 DIAGNOSIS — J45998 Other asthma: Secondary | ICD-10-CM | POA: Diagnosis not present

## 2017-01-27 DIAGNOSIS — E038 Other specified hypothyroidism: Secondary | ICD-10-CM | POA: Diagnosis not present

## 2017-01-27 DIAGNOSIS — C50919 Malignant neoplasm of unspecified site of unspecified female breast: Secondary | ICD-10-CM | POA: Diagnosis not present

## 2017-01-27 DIAGNOSIS — M25552 Pain in left hip: Secondary | ICD-10-CM | POA: Diagnosis not present

## 2017-01-27 DIAGNOSIS — R1032 Left lower quadrant pain: Secondary | ICD-10-CM | POA: Diagnosis not present

## 2017-01-27 DIAGNOSIS — Z1212 Encounter for screening for malignant neoplasm of rectum: Secondary | ICD-10-CM | POA: Diagnosis not present

## 2017-01-27 DIAGNOSIS — E784 Other hyperlipidemia: Secondary | ICD-10-CM | POA: Diagnosis not present

## 2017-01-27 DIAGNOSIS — Z1389 Encounter for screening for other disorder: Secondary | ICD-10-CM | POA: Diagnosis not present

## 2017-02-21 ENCOUNTER — Encounter (HOSPITAL_COMMUNITY): Payer: Self-pay | Admitting: Emergency Medicine

## 2017-02-21 ENCOUNTER — Emergency Department (HOSPITAL_COMMUNITY)
Admission: EM | Admit: 2017-02-21 | Discharge: 2017-02-21 | Disposition: A | Discharge status: Home / Self Care | Payer: PPO | Attending: Emergency Medicine | Admitting: Emergency Medicine

## 2017-02-21 ENCOUNTER — Emergency Department (HOSPITAL_COMMUNITY): Payer: PPO

## 2017-02-21 DIAGNOSIS — M542 Cervicalgia: Secondary | ICD-10-CM | POA: Diagnosis not present

## 2017-02-21 DIAGNOSIS — J45909 Unspecified asthma, uncomplicated: Secondary | ICD-10-CM | POA: Insufficient documentation

## 2017-02-21 DIAGNOSIS — M62838 Other muscle spasm: Secondary | ICD-10-CM | POA: Diagnosis not present

## 2017-02-21 DIAGNOSIS — Z7982 Long term (current) use of aspirin: Secondary | ICD-10-CM | POA: Diagnosis not present

## 2017-02-21 DIAGNOSIS — Z853 Personal history of malignant neoplasm of breast: Secondary | ICD-10-CM | POA: Diagnosis not present

## 2017-02-21 DIAGNOSIS — R51 Headache: Secondary | ICD-10-CM | POA: Diagnosis not present

## 2017-02-21 DIAGNOSIS — Z79899 Other long term (current) drug therapy: Secondary | ICD-10-CM | POA: Diagnosis not present

## 2017-02-21 DIAGNOSIS — R519 Headache, unspecified: Secondary | ICD-10-CM

## 2017-02-21 DIAGNOSIS — R9431 Abnormal electrocardiogram [ECG] [EKG]: Secondary | ICD-10-CM | POA: Diagnosis not present

## 2017-02-21 MED ORDER — CYCLOBENZAPRINE HCL 10 MG PO TABS: 10.0000 mg | ORAL_TABLET | Freq: Two times a day (BID) | ORAL | 0 refills | Status: DC | PRN

## 2017-02-21 MED ORDER — SODIUM CHLORIDE 0.9 % IV BOLUS (SEPSIS)
1000.0000 mL | Freq: Once | INTRAVENOUS | Status: AC
  Administered 2017-02-21: 1000 mL via INTRAVENOUS

## 2017-02-21 MED ORDER — PROCHLORPERAZINE MALEATE 10 MG PO TABS: 10.0000 mg | ORAL_TABLET | Freq: Two times a day (BID) | ORAL | 0 refills | Status: DC | PRN

## 2017-02-21 MED ORDER — DEXAMETHASONE SODIUM PHOSPHATE 10 MG/ML IJ SOLN
10.0000 mg | Freq: Once | INTRAMUSCULAR | Status: AC
  Administered 2017-02-21: 10 mg via INTRAVENOUS
  Filled 2017-02-21: qty 1

## 2017-02-21 MED ORDER — DIPHENHYDRAMINE HCL 50 MG/ML IJ SOLN
25.0000 mg | Freq: Once | INTRAMUSCULAR | Status: AC
  Administered 2017-02-21: 25 mg via INTRAVENOUS
  Filled 2017-02-21: qty 1

## 2017-02-21 MED ORDER — PROCHLORPERAZINE EDISYLATE 5 MG/ML IJ SOLN
10.0000 mg | Freq: Once | INTRAMUSCULAR | Status: AC
  Administered 2017-02-21: 10 mg via INTRAVENOUS
  Filled 2017-02-21: qty 2

## 2017-02-21 NOTE — ED Provider Notes (Signed)
[facility] DEPT Provider Note   CSN: [id number] Arrival date & time: 02/21/17  1655     History   Chief Complaint Chief Complaint  Patient presents with  . Headache    HPI Allison Clark is a 66 y.o. female.  The history is provided by the patient, a relative, the spouse and medical records.  Headache   This is a new problem. The current episode started more than 2 days ago (around 1 week). The problem occurs constantly. The problem has been gradually worsening. The headache is associated with bright light. The pain is located in the occipital and left unilateral region. The quality of the pain is described as dull. The pain is at a severity of 9/10. The pain is severe. The pain radiates to the left neck. Pertinent negatives include no fever, no malaise/fatigue, no near-syncope, no palpitations, no syncope, no shortness of breath, no nausea and no vomiting. She has tried NSAIDs for the symptoms. The treatment provided moderate relief.    Past Medical History:  Diagnosis Date  . Asthma   . BBB (bundle branch block)    left  . Breast cancer ([place])   . GERD (gastroesophageal reflux disease)   . Thyroid disease     Patient Active Problem List   Diagnosis Date Noted  . Mild persistent asthma 07/17/2015  . Laryngopharyngeal reflux (LPR) 07/17/2015  . Other allergic rhinitis 07/17/2015  . C. difficile colitis 06/21/2012  . ARF (acute renal failure) ([place]) 06/18/2012  . Diverticulitis 06/14/2012  . Healthcare-associated pneumonia 06/14/2012  . BREAST CANCER 04/10/2010  . ASTHMA 04/10/2010  . GERD 04/10/2010    Past Surgical History:  Procedure Laterality Date  . BREAST SURGERY    . STERILIZATION    . THYROIDECTOMY    . TONSILLECTOMY    . TUBAL LIGATION      OB History    No data available       Home Medications    Prior to Admission medications   Medication Sig Start Date End Date Taking? Authorizing Provider  aspirin 81 MG tablet Take 81 mg by mouth  daily.    [provider]  BENICAR 20 MG tablet Take 20 mg by mouth daily.  01/20/12   [provider]  dexlansoprazole (DEXILANT) 60 MG capsule Take one capsule every morning before breakfast 01/20/17   Kozlow, Donnamarie Poag, MD  fish oil-omega-3 fatty acids 1000 MG capsule Take 2 g by mouth daily.    [provider]  fluticasone (FLONASE) 50 MCG/ACT nasal spray Place 2 sprays into the nose daily.    [provider]  Fluticasone Furoate (ARNUITY ELLIPTA) 200 MCG/ACT AEPB Inhale 1 puff into the lungs daily. 07/22/16   Kozlow, Donnamarie Poag, MD  levalbuterol [facility] HFA) 45 MCG/ACT inhaler Inhale 1-2 puffs into the lungs every 4 (four) hours as needed. Shortness of breath 01/20/17   Kozlow, Donnamarie Poag, MD  levothyroxine (SYNTHROID, LEVOTHROID) 112 MCG tablet Take 112 mcg by mouth daily.    [provider]  loratadine (CLARITIN) 10 MG tablet Take 10 mg by mouth daily.    [provider]  ranitidine (ZANTAC) 300 MG tablet Take one tablet each evening 07/22/16   Kozlow, Donnamarie Poag, MD  simvastatin (ZOCOR) 20 MG tablet Take 20 mg by mouth daily.    [provider]    Family History Family History  Problem Relation Age of Onset  . Allergies Son     Social History Social History  Substance Use  Topics  . Smoking status: Never Smoker  . Smokeless tobacco: Never Used  . Alcohol use No     Allergies   Avelox [moxifloxacin hcl in nacl] and Sulfonamide derivatives   Review of Systems Review of Systems  Constitutional: Negative for appetite change, chills, diaphoresis, fatigue, fever and malaise/fatigue.  HENT: Negative for congestion.   Eyes: Positive for photophobia.  Respiratory: Negative for chest tightness, shortness of breath, wheezing and stridor.   Cardiovascular: Negative for chest pain, palpitations, syncope and near-syncope.  Gastrointestinal: Negative for constipation, diarrhea, nausea and vomiting.  Genitourinary: Negative for dysuria and flank  pain.  Musculoskeletal: Positive for neck pain. Negative for back pain and neck stiffness.  Skin: Negative for rash and wound.  Neurological: Positive for headaches. Negative for dizziness, tremors, seizures, facial asymmetry, speech difficulty, weakness, light-headedness and numbness.  Psychiatric/Behavioral: Negative for agitation and confusion.  All other systems reviewed and are negative.    Physical Exam Updated Vital Signs BP 128/64 (BP Location: Right Arm)   Pulse 86   Temp 98.2 F (36.8 C) (Oral)   Resp (!) 23   SpO2 96%   Physical Exam  Constitutional: She is oriented to person, place, and time. She appears well-developed and well-nourished. No distress.  HENT:  Head: Normocephalic and atraumatic.  Mouth/Throat: Oropharynx is clear and moist. No oropharyngeal exudate.  Eyes: Conjunctivae and EOM are normal. Pupils are equal, round, and reactive to light.  Neck: Normal range of motion. Neck supple. Muscular tenderness present. No spinous process tenderness present. No neck rigidity. No edema and normal range of motion present.    Left posterior neck tenderness and palpable muscle spasm. No Nuchal rigidity with downward neck movement.   Cardiovascular: Normal rate and regular rhythm.   No murmur heard. Pulmonary/Chest: Effort normal and breath sounds normal. No stridor. No respiratory distress. She has no wheezes. She exhibits no tenderness.  Abdominal: Soft. There is no tenderness.  Musculoskeletal: She exhibits tenderness. She exhibits no edema or deformity.  Lymphadenopathy:    She has no cervical adenopathy.  Neurological: She is alert and oriented to person, place, and time. She is not disoriented. She displays no tremor and normal reflexes. No cranial nerve deficit or sensory deficit. She exhibits normal muscle tone. She displays no seizure activity. Coordination normal. GCS eye subscore is 4. GCS verbal subscore is 5. GCS motor subscore is 6.  Skin: Skin is warm and  dry. Capillary refill takes less than 2 seconds. No rash noted. No erythema.  Psychiatric: She has a normal mood and affect.  Nursing note and vitals reviewed.    ED Treatments / Results  Labs (all labs ordered are listed, but only abnormal results are displayed) Labs Reviewed - No data to display  EKG  EKG Interpretation  Date/Time:  Friday Feb 21 2017 16:57:07 EDT Ventricular Rate:  84 PR Interval:    QRS Duration: 167 QT Interval:  417 QTC Calculation: 493 R Axis:   -46 Text Interpretation:  Sinus rhythm Consider left atrial enlargement Left bundle branch block Baseline wander in lead(s) V3 V6 old LBBB. no acute changes compared to previous Confirmed by Johnney Killian, MD, Jeannie Done [phone]) on 02/21/2017 5:07:16 PM       Radiology Ct Head Wo Contrast  Result Date: 02/21/2017 CLINICAL DATA:  Left-sided headache radiating to the left neck shoulder for the past week. EXAM: CT HEAD WITHOUT CONTRAST TECHNIQUE: Contiguous axial images were obtained from the base of the skull through the vertex without intravenous contrast. COMPARISON:  Brain MR dated 12/30/2007 and head CT dated 05/09/2005. FINDINGS: Brain: Interval minimally enlarged ventricles and subarachnoid spaces. No intracranial hemorrhage, mass lesion or CT evidence of acute infarction. Vascular: No hyperdense vessel or unexpected calcification. Skull: Normal. Negative for fracture or focal lesion. Sinuses/Orbits: Unremarkable. Other: None. IMPRESSION: No acute abnormality. Interval minimal diffuse cerebral and cerebellar atrophy, commensurate with age. Electronically Signed   By: Claudie Revering M.D.   On: 02/21/2017 20:27    Procedures Procedures (including critical care time)  Medications Ordered in ED Medications  sodium chloride 0.9 % bolus 1,000 mL (0 mLs Intravenous Stopped 02/21/17 2200)  prochlorperazine (COMPAZINE) injection 10 mg (10 mg Intravenous Given 02/21/17 1950)  diphenhydrAMINE (BENADRYL) injection 25 mg (25 mg  Intravenous Given 02/21/17 1950)  dexamethasone (DECADRON) injection 10 mg (10 mg Intravenous Given 02/21/17 2305)     Initial Impression / Assessment and Plan / ED Course  I have reviewed the triage vital signs and the nursing notes.  Pertinent labs & imaging results that were available during my care of the patient were reviewed by me and considered in my medical decision making (see chart for details).     Allison Clark is a 66 y.o. female with a past medical history significant for asthma, croup, and prior breast cancer who presents with severe headaches and left arm tingling. Patient reports that for the last week, she has had intermittent headache that she has taken Aleve and Tylenol for her. She says that today, the medicine stopped it. Her symptoms. She says the pain is primarily a left-sided overhead including the left occiput. She says that her left neck feels very tense and has spasms. She says that she is having tingling going down her left arm that shoots. She denies any change in grip strength and denies significant pain in her arm. She denies any chest pain or shortness of breath. She denies recent head injuries or neck injuries including no recent twisting injuries. She denies any chiropractor use. She denies fever, chills, nausea, vomiting, vision changes. She denies any urinary symptoms or GI symptoms. She denies chest pain or shortness of breath. She denies any cough.  On exam, patient no focal neurologic deficits. Patient does have spasm and tenderness in the left lateral neck. Patient has the left arm tingling reproduced with this palpation. Suspect a radicular type pain  Versus tension-type pain in her head and neck.  The patient's cancer history, patient will have imaging to look for metastasis or other acute or rebound. Patient was given a headache cocktail while awaiting reassessment.  CT of the head showed no acute abnormality. Diffuse cerebral and cerebellar atrophy  present.  Patient reported significant improvement in headache after cocktail. Suspect migraine versus tension headache with muscle spasm of the neck. Given significant improvement, feel patient is safe for discharge.  Patient will be given prescription for muscle relaxant to up with her muscle tension of the neck. Patient also be given had a medication.  Patient given instructions to follow up with her PCP for further management as well as strict return precautions for any new or worsened symptoms. Do not feel patient has meningitis, stroke, bleed, or other person causing headache at this time. Do not feel patient has vertebral dissection given the palpable muscle spasms and significant improvement with no neurologic deficits. Patient and family agreed with plan of care as well as return precautions. Patient discharged in good condition significant improvement in symptoms.    Final Clinical Impressions(s) / ED  Diagnoses   Final diagnoses:  Acute nonintractable headache, unspecified headache type  Muscle spasm    New Prescriptions Discharge Medication List as of 02/21/2017 11:13 PM    START taking these medications   Details  cyclobenzaprine (FLEXERIL) 10 MG tablet Take 1 tablet (10 mg total) by mouth 2 (two) times daily as needed for muscle spasms., Starting Fri 02/21/2017, Print    prochlorperazine (COMPAZINE) 10 MG tablet Take 1 tablet (10 mg total) by mouth 2 (two) times daily as needed for nausea or vomiting., Starting Fri 02/21/2017, Print       Clinical Impression: 1. Acute nonintractable headache, unspecified headache type   2. Muscle spasm     Disposition: Discharge  Condition: Good  I have discussed the results, Dx and Tx plan with the pt(& family if present). He/she/they expressed understanding and agree(s) with the plan. Discharge instructions discussed at great length. Strict return precautions discussed and pt &/or family have verbalized understanding of the  instructions. No further questions at time of discharge.    Discharge Medication List as of 02/21/2017 11:13 PM    START taking these medications   Details  cyclobenzaprine (FLEXERIL) 10 MG tablet Take 1 tablet (10 mg total) by mouth 2 (two) times daily as needed for muscle spasms., Starting Fri 02/21/2017, Print    prochlorperazine (COMPAZINE) 10 MG tablet Take 1 tablet (10 mg total) by mouth 2 (two) times daily as needed for nausea or vomiting., Starting Fri 02/21/2017, Print        Follow Up: Crist Infante, [address] [place] [id number] [phone]  Schedule an appointment as soon as possible for a visit    [place] [facility]PT [place] [phone] [place] [phone]  If symptoms worsen     Shloime Keilman, Gwenyth Allegra, MD 02/22/17 1340

## 2017-02-21 NOTE — Discharge Instructions (Signed)
Please follow up with her primary care physician for further management of your headache and muscle spasms. Please take the muscle relaxant as needed to help with her muscle spasms of her neck. Please take the pain and nausea medicines to avoid her nausea and headache. If symptoms change or worsen, please return to the nearest emergency department.

## 2017-02-21 NOTE — ED Triage Notes (Signed)
Pt complaint of left side headache radiating left neck and left shoulder for a week. Pt described pain as pressure. Pt denies chest pain. Pt denies n/v/d.

## 2017-02-28 DIAGNOSIS — M542 Cervicalgia: Secondary | ICD-10-CM

## 2017-02-28 DIAGNOSIS — Z6831 Body mass index (BMI) 31.0-31.9, adult: Secondary | ICD-10-CM | POA: Diagnosis not present

## 2017-02-28 HISTORY — DX: Cervicalgia: M54.2

## 2017-04-08 ENCOUNTER — Other Ambulatory Visit: Payer: Self-pay | Admitting: Allergy and Immunology

## 2017-05-01 DIAGNOSIS — M2241 Chondromalacia patellae, right knee: Secondary | ICD-10-CM | POA: Diagnosis not present

## 2017-05-01 DIAGNOSIS — G8929 Other chronic pain: Secondary | ICD-10-CM | POA: Diagnosis not present

## 2017-05-01 DIAGNOSIS — M25561 Pain in right knee: Secondary | ICD-10-CM | POA: Diagnosis not present

## 2017-06-18 DIAGNOSIS — H40013 Open angle with borderline findings, low risk, bilateral: Secondary | ICD-10-CM | POA: Diagnosis not present

## 2017-07-12 DIAGNOSIS — Z23 Encounter for immunization: Secondary | ICD-10-CM | POA: Diagnosis not present

## 2017-07-21 ENCOUNTER — Ambulatory Visit: Payer: PPO | Admitting: Allergy and Immunology

## 2017-07-23 ENCOUNTER — Encounter: Payer: Self-pay | Admitting: Allergy and Immunology

## 2017-07-23 ENCOUNTER — Ambulatory Visit (INDEPENDENT_AMBULATORY_CARE_PROVIDER_SITE_OTHER): Payer: PPO | Admitting: Allergy and Immunology

## 2017-07-23 VITALS — BP 110/78 | HR 88 | Resp 18

## 2017-07-23 DIAGNOSIS — J4540 Moderate persistent asthma, uncomplicated: Secondary | ICD-10-CM

## 2017-07-23 DIAGNOSIS — K219 Gastro-esophageal reflux disease without esophagitis: Secondary | ICD-10-CM | POA: Diagnosis not present

## 2017-07-23 DIAGNOSIS — J3089 Other allergic rhinitis: Secondary | ICD-10-CM | POA: Diagnosis not present

## 2017-07-23 NOTE — Patient Instructions (Addendum)
  1. Continue Arnuity 200 one inhalation Monday through Friday   2. "Action plan" for asthma flare up:   A. increase Arnuity to 2 inhalations twice a day  B. use Xopenex HFA if needed  3. Continue Dexilant 60mg  one tablet 3-7 times per week  4. Continue Flonase, and Claritin if needed  5. Return in 6 months or earlier if problem

## 2017-07-23 NOTE — Progress Notes (Signed)
Follow-up Note  Referring Provider: Crist Infante, MD Primary Provider: Crist Infante, MD Date of Office Visit: 07/23/2017  Subjective:   BETH GOODLIN (DOB: 28-Apr-1951) is a 66 y.o. female who returns to the [facility] [place] on 07/23/2017 in re-evaluation of the following:  HPI: Maurice presents to this clinic in reevaluation of her asthma and allergic rhinitis and LPR. Her last visit to this clinic was April 2018.  During the interval her asthma has been excellent. She has not required the use of a short-acting bronchodilator nor systemic steroids for an exacerbation nor has she had to activate her action plan. She is exercising on almost a daily basis with walking.   She has had no problems with her nose. She has not required an antibiotic to treat a episode of sinusitis.   Her reflux is under excellent control and she has had no problems with her throat. She discontinued her ranitidine and is just using her PPI every day.   Allergies as of 07/23/2017      Reactions   Avelox [moxifloxacin Hcl In Nacl] Hives   Sulfa Antibiotics Rash      Medication List      ARNUITY ELLIPTA 200 MCG/ACT Aepb Generic drug:  Fluticasone Furoate INHALE 1 PUFF DAILY   aspirin EC 81 MG tablet Take 81 mg by mouth at bedtime.   dexlansoprazole 60 MG capsule Commonly known as:  DEXILANT Take one capsule every morning before breakfast   fluticasone 50 MCG/ACT nasal spray Commonly known as:  FLONASE Place 1 spray into both nostrils daily.   levalbuterol 45 MCG/ACT inhaler Commonly known as:  XOPENEX HFA Inhale 1-2 puffs into the lungs every 4 (four) hours as needed. Shortness of breath   levothyroxine 112 MCG tablet Commonly known as:  SYNTHROID, LEVOTHROID Take 112 mcg by mouth daily before breakfast.   loratadine 10 MG tablet Commonly known as:  CLARITIN Take 10 mg by mouth daily.   olmesartan 20 MG tablet Commonly known as:  BENICAR Take 20 mg by mouth daily.     omega-3 acid ethyl esters 1 g capsule Commonly known as:  LOVAZA Take 1 g by mouth at bedtime.   ranitidine 300 MG tablet Commonly known as:  ZANTAC Take one tablet each evening   simvastatin 20 MG tablet Commonly known as:  ZOCOR Take 20 mg by mouth at bedtime.       Past Medical History:  Diagnosis Date  . Asthma   . BBB (bundle branch block)    left  . Breast cancer ([place])   . GERD (gastroesophageal reflux disease)   . Thyroid disease     Past Surgical History:  Procedure Laterality Date  . BREAST SURGERY    . STERILIZATION    . THYROIDECTOMY    . TONSILLECTOMY    . TUBAL LIGATION      Review of systems negative except as noted in HPI / PMHx or noted below:  Review of Systems  Constitutional: Negative.   HENT: Negative.   Eyes: Negative.   Respiratory: Negative.   Cardiovascular: Negative.   Gastrointestinal: Negative.   Genitourinary: Negative.   Musculoskeletal: Negative.   Skin: Negative.   Neurological: Negative.   Endo/Heme/Allergies: Negative.   Psychiatric/Behavioral: Negative.      Objective:   Vitals:   07/23/17 1511  BP: 110/78  Pulse: 88  Resp: 18          Physical Exam  Constitutional: She is well-developed, well-nourished, and  in no distress.  HENT:  Head: Normocephalic.  Right Ear: Tympanic membrane, external ear and ear canal normal.  Left Ear: Tympanic membrane, external ear and ear canal normal.  Nose: Nose normal. No mucosal edema or rhinorrhea.  Mouth/Throat: Uvula is midline, oropharynx is clear and moist and mucous membranes are normal. No oropharyngeal exudate.  Eyes: Conjunctivae are normal.  Neck: Trachea normal. No tracheal tenderness present. No tracheal deviation present. No thyromegaly present.  Cardiovascular: Normal rate, regular rhythm, S1 normal, S2 normal and normal heart sounds.   No murmur heard. Pulmonary/Chest: Breath sounds normal. No stridor. No respiratory distress. She has no wheezes. She has no  rales.  Musculoskeletal: She exhibits no edema.  Lymphadenopathy:       Head (right side): No tonsillar adenopathy present.       Head (left side): No tonsillar adenopathy present.    She has no cervical adenopathy.  Neurological: She is alert. Gait normal.  Skin: No rash noted. She is not diaphoretic. No erythema. Nails show no clubbing.  Psychiatric: Mood and affect normal.    Diagnostics:    Spirometry was performed and demonstrated an FEV1 of 1.70 at 71 % of predicted.  The patient had an Asthma Control Test with the following results: ACT Total Score: 21.    Assessment and Plan:   1. Asthma, moderate persistent, well-controlled   2. Other allergic rhinitis   3. Laryngopharyngeal reflux (LPR)     1. Continue Arnuity 200 one inhalation Monday through Friday   2. "Action plan" for asthma flare up:   A. increase Arnuity to 2 inhalations twice a day  B. use Xopenex HFA if needed  3. Continue Dexilant 60mg  one tablet 3-7 times per week  4. Continue Flonase, and Claritin if needed  5. Return in 6 months or earlier if problem  Rossana is really doing very well with her respiratory tract inflammatory condition on minimal amounts of medications as noted above and she will continue to use Arnuity and Flonase. Her reflux also appears to be doing quite well now that she has had some lifestyle changes and we will see if she can consolidate her treatment for reflux by trying to use her proton pump inhibitor just 3 times per week. If she does well I will see her back in this clinic in 6 months or earlier if there is a problem.  [name], MD Allergy / Immunology [place]

## 2017-08-25 ENCOUNTER — Other Ambulatory Visit: Payer: Self-pay | Admitting: Allergy and Immunology

## 2017-08-27 DIAGNOSIS — L821 Other seborrheic keratosis: Secondary | ICD-10-CM | POA: Diagnosis not present

## 2017-08-27 DIAGNOSIS — L738 Other specified follicular disorders: Secondary | ICD-10-CM | POA: Diagnosis not present

## 2017-08-27 DIAGNOSIS — D1801 Hemangioma of skin and subcutaneous tissue: Secondary | ICD-10-CM | POA: Diagnosis not present

## 2017-08-27 DIAGNOSIS — L820 Inflamed seborrheic keratosis: Secondary | ICD-10-CM | POA: Diagnosis not present

## 2017-08-28 ENCOUNTER — Other Ambulatory Visit: Payer: Self-pay | Admitting: Obstetrics and Gynecology

## 2017-08-28 ENCOUNTER — Other Ambulatory Visit: Payer: Self-pay | Admitting: Oncology

## 2017-08-28 DIAGNOSIS — Z1231 Encounter for screening mammogram for malignant neoplasm of breast: Secondary | ICD-10-CM

## 2017-09-08 ENCOUNTER — Other Ambulatory Visit: Payer: Self-pay | Admitting: Pharmacy Technician

## 2017-09-08 NOTE — Patient Outreach (Signed)
[facility]) Care Management  09/08/2017  Allison Clark 1950-10-22 [id number]   Incoming Emmi call from [name] today in reference to medication adherence for Olmesartan and Simvastation. HIPAA identifiers verified and verbal consent received.  The patient states she takes both medication's daily and rarely misses any doses. As we discussed her medication's further and I went over the fill dates she admitted that maybe she misses more doses than she thought. She has refilled Simvastatin on 10/4 and Olmesartan on 11/1, both for 90 ds. Also she voiced concern over the cost of her spouses medication's now that he is in the coverage gap. I have referred him to [place], [place].  Doreene Burke, [place] [phone]

## 2017-10-16 ENCOUNTER — Other Ambulatory Visit: Payer: Self-pay

## 2017-10-16 ENCOUNTER — Other Ambulatory Visit: Payer: Self-pay | Admitting: Allergy and Immunology

## 2017-10-16 MED ORDER — FLUTICASONE FUROATE 200 MCG/ACT IN AEPB: 1.0000 | INHALATION_SPRAY | Freq: Every day | RESPIRATORY_TRACT | 3 refills | Status: DC

## 2017-10-16 NOTE — Telephone Encounter (Signed)
RX for Arnuity sent into patients pharmacy.

## 2017-10-20 ENCOUNTER — Ambulatory Visit: Payer: PPO

## 2017-10-20 ENCOUNTER — Encounter: Payer: Self-pay | Admitting: Allergy

## 2017-10-20 ENCOUNTER — Ambulatory Visit (INDEPENDENT_AMBULATORY_CARE_PROVIDER_SITE_OTHER): Payer: PPO | Admitting: Allergy

## 2017-10-20 DIAGNOSIS — J4530 Mild persistent asthma, uncomplicated: Secondary | ICD-10-CM | POA: Diagnosis not present

## 2017-10-20 DIAGNOSIS — J0100 Acute maxillary sinusitis, unspecified: Secondary | ICD-10-CM

## 2017-10-20 DIAGNOSIS — K219 Gastro-esophageal reflux disease without esophagitis: Secondary | ICD-10-CM | POA: Diagnosis not present

## 2017-10-20 DIAGNOSIS — J3089 Other allergic rhinitis: Secondary | ICD-10-CM | POA: Diagnosis not present

## 2017-10-20 MED ORDER — AMOXICILLIN-POT CLAVULANATE 875-125 MG PO TABS: ORAL_TABLET | ORAL | 0 refills | Status: DC

## 2017-10-20 NOTE — Patient Instructions (Signed)
  1. Continue Arnuity 200 one inhalation Monday through Friday   2. "Action plan" for asthma flare up:   A. increase Arnuity to 2 inhalations twice a day  B. use Xopenex HFA if needed  3. Continue Dexilant 60mg  one tablet 3-7 times per week  4. Continue Flonase, and Claritin if needed  5. Start prednisone 20mg  twice a day x 5 days.   Advised of symptoms are not improved or worsening after starting steroid to fill her Augmentin prescription for bacterial coverage  6. Return in 6 months or earlier if problem

## 2017-10-20 NOTE — Progress Notes (Signed)
Follow-up Note  RE: EARA BURRUEL MRN: [id number] DOB: [birth year] Date of Office Visit: 10/20/2017   History of present illness: Allison Clark is a 67 y.o. female presenting today for sick visit.  She presents today with her husband.  She was last seen in the office on July 23, 2017 by Dr. Carmelina Peal for asthma, allergic rhinitis and reflux.  She states her husband was sick first with similar symptoms but he is now doing much better with over-the-counter medications.  Genieve started having symptoms on Friday with runny nose, watery eyes and sinus pressure and pain in her maxillary sinuses and forehead.  The symptoms have not improved since they started.  She also states that her throat has been burning.  She also has had a dry cough and some sneezing.  She denies any fevers or joint aches or pains.  She states she has been using Mucinex since Friday as well as increase her Flonase to twice a day use.  She feels like this has helped a little bit.  She continues to use her Arnuity which she states she has increased to using it over the weekend as well.  Previously she was doing it 1 puff Monday through Friday.  She denies any use of her albuterol at this time.  She continues to take her Dexilant and ranitidine.  She states in the past a course of oral steroids has helped with similar like symptoms.  She would like to refrain from use of antibiotics as much as possible as she reports previous history of C. difficile with antibiotics.     Review of systems: Review of Systems  Constitutional: Negative for chills, fever and malaise/fatigue.  HENT: Positive for congestion, sinus pain and sore throat. Negative for ear discharge, ear pain and nosebleeds.   Eyes: Negative for pain, discharge and redness.  Respiratory: Positive for cough. Negative for sputum production, shortness of breath, wheezing and stridor.   Cardiovascular: Negative for chest pain.  Gastrointestinal: Negative for abdominal pain,  constipation, diarrhea, heartburn, nausea and vomiting.  Musculoskeletal: Negative for joint pain and myalgias.  Skin: Negative for itching and rash.  Neurological: Positive for headaches. Negative for dizziness.    All other systems negative unless noted above in HPI  Past medical/social/surgical/family history have been reviewed and are unchanged unless specifically indicated below.  No changes  Medication List: Allergies as of 10/20/2017      Reactions   Avelox [moxifloxacin Hcl In Nacl] Hives   Sulfa Antibiotics Rash      Medication List        Accurate as of 10/20/17  5:42 PM. Always use your most recent med list.          amoxicillin-clavulanate 875-125 MG tablet Commonly known as:  AUGMENTIN Take one tablet by mouth twice a day for ten days.   ARNUITY ELLIPTA 200 MCG/ACT Aepb Generic drug:  Fluticasone Furoate INHALE 1 PUFF DAILY   Fluticasone Furoate 200 MCG/ACT Aepb Commonly known as:  ARNUITY ELLIPTA Inhale 1 puff into the lungs daily.   aspirin EC 81 MG tablet Take 81 mg by mouth at bedtime.   dexlansoprazole 60 MG capsule Commonly known as:  DEXILANT Take one capsule every morning before breakfast   fluticasone 50 MCG/ACT nasal spray Commonly known as:  FLONASE Place 1 spray into both nostrils daily.   levalbuterol 45 MCG/ACT inhaler Commonly known as:  XOPENEX HFA INHALE 1 TO 2 PUFFS INTO THE LUNGS EVERY 4 HOURS AS NEEDED  FOR SHORTNESS OF BREATH   levothyroxine 112 MCG tablet Commonly known as:  SYNTHROID, LEVOTHROID Take 112 mcg by mouth daily before breakfast.   loratadine 10 MG tablet Commonly known as:  CLARITIN Take 10 mg by mouth daily.   olmesartan 20 MG tablet Commonly known as:  BENICAR Take 20 mg by mouth daily.   omega-3 acid ethyl esters 1 g capsule Commonly known as:  LOVAZA Take 1 g by mouth at bedtime.   ranitidine 300 MG tablet Commonly known as:  ZANTAC Take one tablet each evening   simvastatin 20 MG tablet Commonly  known as:  ZOCOR Take 20 mg by mouth at bedtime.       Known medication allergies: Allergies  Allergen Reactions  . Avelox [Moxifloxacin Hcl In Nacl] Hives  . Sulfa Antibiotics Rash     Physical examination: Vitals performed but not documented this encounter erroneously  General: Alert, interactive, in no acute distress. HEENT: TMs pearly gray, turbinates moderately edematous with clear discharge, post-pharynx non erythematous.  Mild tenderness to palpation of the maxillary sinuses Neck: Supple without lymphadenopathy. Lungs: Clear to auscultation without wheezing, rhonchi or rales. {no increased work of breathing. CV: Normal S1, S2 without murmurs. Abdomen: Nondistended, nontender. Skin: Warm and dry, without lesions or rashes. Extremities:  No clubbing, cyanosis or edema. Neuro:   Grossly intact.  Diagnositics/Labs:  Spirometry: FEV1: 1.52L  63%, FVC: 1.81L  57% lung function is reduced however it is about stable from previous study   Assessment and plan:  Acute maxillary sinusitis -she appears to have contracted a viral illness from family  member.  Given duration it is likely still viral at this time.  Will treat symptomatically  with prednisone and have advised her to fill her Augmentin prescription if symptoms  fail to improve or worsen while on steroid.  She will continue supportive care including  adequate hydration and monitor for fevers Mild persistent asthma - continue current management Allergic rhinitis - continue current management LPR - continue current management  1. Continue Arnuity 200 one inhalation Monday through Friday   2. "Action plan" for asthma flare up:   A. increase Arnuity to 2 inhalations twice a day  B. use Xopenex HFA if needed  3. Continue Dexilant 60mg  one tablet 3-7 times per week  4. Continue Flonase, and Claritin if needed  5. Start prednisone 20mg  twice a day x 5 days.   Advised of symptoms are not improved or worsening after  starting steroid to fill her Augmentin prescription for bacterial coverage  6. Return in 6 months or earlier if problem    I appreciate the opportunity to take part in Ranae's care. Please do not hesitate to contact me with questions.  Sincerely,   Prudy Feeler, MD Allergy/Immunology [facility]

## 2017-10-23 ENCOUNTER — Ambulatory Visit: Payer: PPO | Admitting: Allergy

## 2017-11-03 ENCOUNTER — Other Ambulatory Visit: Payer: Self-pay | Admitting: Obstetrics and Gynecology

## 2017-11-03 DIAGNOSIS — Z1231 Encounter for screening mammogram for malignant neoplasm of breast: Secondary | ICD-10-CM

## 2017-11-21 ENCOUNTER — Ambulatory Visit
Admission: RE | Admit: 2017-11-21 | Discharge: 2017-11-21 | Disposition: A | Discharge status: Home / Self Care | Payer: PPO | Source: Ambulatory Visit | Attending: Obstetrics and Gynecology | Admitting: Obstetrics and Gynecology

## 2017-11-21 DIAGNOSIS — Z1231 Encounter for screening mammogram for malignant neoplasm of breast: Secondary | ICD-10-CM

## 2017-12-05 DIAGNOSIS — Z6831 Body mass index (BMI) 31.0-31.9, adult: Secondary | ICD-10-CM | POA: Diagnosis not present

## 2017-12-05 DIAGNOSIS — Z01419 Encounter for gynecological examination (general) (routine) without abnormal findings: Secondary | ICD-10-CM | POA: Diagnosis not present

## 2017-12-05 DIAGNOSIS — Z1389 Encounter for screening for other disorder: Secondary | ICD-10-CM | POA: Diagnosis not present

## 2017-12-05 DIAGNOSIS — Z130 Encounter for screening for diseases of the blood and blood-forming organs and certain disorders involving the immune mechanism: Secondary | ICD-10-CM | POA: Diagnosis not present

## 2017-12-05 DIAGNOSIS — N763 Subacute and chronic vulvitis: Secondary | ICD-10-CM | POA: Diagnosis not present

## 2017-12-05 DIAGNOSIS — R87619 Unspecified abnormal cytological findings in specimens from cervix uteri: Secondary | ICD-10-CM | POA: Diagnosis not present

## 2017-12-05 DIAGNOSIS — Z124 Encounter for screening for malignant neoplasm of cervix: Secondary | ICD-10-CM | POA: Diagnosis not present

## 2017-12-15 DIAGNOSIS — N858 Other specified noninflammatory disorders of uterus: Secondary | ICD-10-CM | POA: Diagnosis not present

## 2017-12-15 DIAGNOSIS — Z8742 Personal history of other diseases of the female genital tract: Secondary | ICD-10-CM | POA: Diagnosis not present

## 2017-12-15 DIAGNOSIS — N72 Inflammatory disease of cervix uteri: Secondary | ICD-10-CM | POA: Diagnosis not present

## 2018-01-21 ENCOUNTER — Ambulatory Visit (INDEPENDENT_AMBULATORY_CARE_PROVIDER_SITE_OTHER): Payer: PPO | Admitting: Allergy and Immunology

## 2018-01-21 ENCOUNTER — Other Ambulatory Visit: Payer: Self-pay | Admitting: Allergy and Immunology

## 2018-01-21 ENCOUNTER — Encounter: Payer: Self-pay | Admitting: Allergy and Immunology

## 2018-01-21 VITALS — BP 138/80 | HR 100 | Resp 18

## 2018-01-21 DIAGNOSIS — J4540 Moderate persistent asthma, uncomplicated: Secondary | ICD-10-CM | POA: Diagnosis not present

## 2018-01-21 DIAGNOSIS — K219 Gastro-esophageal reflux disease without esophagitis: Secondary | ICD-10-CM | POA: Diagnosis not present

## 2018-01-21 DIAGNOSIS — J3089 Other allergic rhinitis: Secondary | ICD-10-CM

## 2018-01-21 MED ORDER — LEVALBUTEROL TARTRATE 45 MCG/ACT IN AERO: INHALATION_SPRAY | RESPIRATORY_TRACT | 1 refills | Status: DC

## 2018-01-21 MED ORDER — FLUTICASONE FUROATE 200 MCG/ACT IN AEPB: 1.0000 | INHALATION_SPRAY | Freq: Every day | RESPIRATORY_TRACT | 1 refills | Status: DC

## 2018-01-21 MED ORDER — DEXLANSOPRAZOLE 60 MG PO CPDR: DELAYED_RELEASE_CAPSULE | ORAL | 1 refills | Status: DC

## 2018-01-22 ENCOUNTER — Encounter: Payer: Self-pay | Admitting: Allergy and Immunology

## 2018-01-22 NOTE — Progress Notes (Signed)
Follow-up Note  Referring Provider: Crist Infante, MD Primary Provider: Crist Infante, MD Date of Office Visit: 01/21/2018  Subjective:   Allison Clark (DOB: Aug 13, 1951) is a 67 y.o. female who returns to the [facility] on 01/21/2018 in re-evaluation of the following:  HPI: Cloa returns to this clinic in reevaluation of her asthma and allergic rhinitis and LPR.  Her last visit with me in this clinic was 23 July 2017 but she did need to visit with Dr. Nelva Bush on 20 October 2017 for what appeared to be a viral induced respiratory tract flare.  Overall she has done well.  She can exert herself and rarely uses a short acting bronchodilator.  Other than that event in January at which point in time she required a systemic steroid she has not utilize any additional steroids or antibiotics.  Her throat is going quite well and she has not been having any issues with reflux.  Her upper airway is doing quite well and it does not sound as though she is required an antibiotic to treat an episode of sinusitis.  Allergies as of 01/21/2018      Reactions   Avelox [moxifloxacin Hcl In Nacl] Hives   Sulfa Antibiotics Rash      Medication List      aspirin EC 81 MG tablet Take 81 mg by mouth at bedtime.   dexlansoprazole 60 MG capsule Commonly known as:  DEXILANT Take one capsule every morning before breakfast   fluticasone 50 MCG/ACT nasal spray Commonly known as:  FLONASE Place 1 spray into both nostrils daily.   Fluticasone Furoate 200 MCG/ACT Aepb Commonly known as:  ARNUITY ELLIPTA Inhale 1 Dose into the lungs daily. Rinse, gargle, and spit after use.   levalbuterol 45 MCG/ACT inhaler Commonly known as:  XOPENEX HFA INHALE 2 PUFFS BY MOUTH EVERY 4 TO 6 HOURS AS NEEDED FOR COUGH OR WHEEZE   levothyroxine 112 MCG tablet Commonly known as:  SYNTHROID, LEVOTHROID Take 112 mcg by mouth daily before breakfast.   loratadine 10 MG tablet Commonly known as:   CLARITIN Take 10 mg by mouth daily.   olmesartan 20 MG tablet Commonly known as:  BENICAR Take 20 mg by mouth daily.   omega-3 acid ethyl esters 1 g capsule Commonly known as:  LOVAZA Take 1 g by mouth at bedtime.   simvastatin 20 MG tablet Commonly known as:  ZOCOR Take 20 mg by mouth at bedtime.       Past Medical History:  Diagnosis Date  . Asthma   . BBB (bundle branch block)    left  . Breast cancer ([place])   . GERD (gastroesophageal reflux disease)   . Thyroid disease     Past Surgical History:  Procedure Laterality Date  . BREAST LUMPECTOMY Left    malignant  . BREAST SURGERY    . STERILIZATION    . THYROIDECTOMY    . TONSILLECTOMY    . TUBAL LIGATION      Review of systems negative except as noted in HPI / PMHx or noted below:  Review of Systems  Constitutional: Negative.   HENT: Negative.   Eyes: Negative.   Respiratory: Negative.   Cardiovascular: Negative.   Gastrointestinal: Negative.   Genitourinary: Negative.   Musculoskeletal: Negative.   Skin: Negative.   Neurological: Negative.   Endo/Heme/Allergies: Negative.   Psychiatric/Behavioral: Negative.      Objective:   Vitals:   01/21/18 1446  BP: 138/80  Pulse:  100  Resp: 18          Physical Exam  HENT:  Head: Normocephalic.  Right Ear: Tympanic membrane, external ear and ear canal normal.  Left Ear: Tympanic membrane, external ear and ear canal normal.  Nose: Nose normal. No mucosal edema or rhinorrhea.  Mouth/Throat: Uvula is midline, oropharynx is clear and moist and mucous membranes are normal. No oropharyngeal exudate.  Eyes: Conjunctivae are normal.  Neck: Trachea normal. No tracheal tenderness present. No tracheal deviation present. No thyromegaly present.  Cardiovascular: Normal rate, regular rhythm, S1 normal, S2 normal and normal heart sounds.  No murmur heard. Pulmonary/Chest: Breath sounds normal. No stridor. No respiratory distress. She has no wheezes. She has no  rales.  Musculoskeletal: She exhibits no edema.  Lymphadenopathy:       Head (right side): No tonsillar adenopathy present.       Head (left side): No tonsillar adenopathy present.    She has no cervical adenopathy.  Neurological: She is alert.  Skin: No rash noted. She is not diaphoretic. No erythema. Nails show no clubbing.    Diagnostics:    Spirometry was performed and demonstrated an FEV1 of 1.52 at 64 % of predicted.  The patient had an Asthma Control Test with the following results: ACT Total Score: 22.    Assessment and Plan:   1. Asthma, moderate persistent, well-controlled   2. Other allergic rhinitis   3. Laryngopharyngeal reflux (LPR)     1. Continue Arnuity 200 one inhalation Monday through Friday   2. "Action plan" for asthma flare up:   A. increase Arnuity to 2 inhalations twice a day  B. use Xopenex HFA if needed  3. Continue Dexilant 60mg  one tablet daily  4. Continue Flonase, and Claritin if needed  5. Return in 6 months or earlier if problem  Overall Snow has done relatively well on her current plan which includes anti-inflammatory medications for her respiratory tract and the use of a proton pump inhibitor for her reflux and LPR.  Assuming she does well I will see her back in this clinic in 6 months or earlier if there is a problem.  [name], MD Allergy / Immunology [place]

## 2018-01-22 NOTE — Patient Instructions (Addendum)
  1. Continue Arnuity 200 one inhalation Monday through Friday   2. "Action plan" for asthma flare up:   A. increase Arnuity to 2 inhalations twice a day  B. use Xopenex HFA if needed  3. Continue Dexilant 60mg  one tablet daily  4. Continue Flonase, and Claritin if needed  5. Return in 6 months or earlier if problem

## 2018-01-23 DIAGNOSIS — E7849 Other hyperlipidemia: Secondary | ICD-10-CM | POA: Diagnosis not present

## 2018-01-23 DIAGNOSIS — I10 Essential (primary) hypertension: Secondary | ICD-10-CM | POA: Diagnosis not present

## 2018-01-23 DIAGNOSIS — R82998 Other abnormal findings in urine: Secondary | ICD-10-CM | POA: Diagnosis not present

## 2018-01-23 DIAGNOSIS — M859 Disorder of bone density and structure, unspecified: Secondary | ICD-10-CM | POA: Diagnosis not present

## 2018-01-23 DIAGNOSIS — E038 Other specified hypothyroidism: Secondary | ICD-10-CM | POA: Diagnosis not present

## 2018-02-03 DIAGNOSIS — Z1212 Encounter for screening for malignant neoplasm of rectum: Secondary | ICD-10-CM | POA: Diagnosis not present

## 2018-02-11 DIAGNOSIS — E038 Other specified hypothyroidism: Secondary | ICD-10-CM | POA: Diagnosis not present

## 2018-02-11 DIAGNOSIS — E7849 Other hyperlipidemia: Secondary | ICD-10-CM | POA: Diagnosis not present

## 2018-02-11 DIAGNOSIS — M25552 Pain in left hip: Secondary | ICD-10-CM | POA: Diagnosis not present

## 2018-02-11 DIAGNOSIS — I10 Essential (primary) hypertension: Secondary | ICD-10-CM | POA: Diagnosis not present

## 2018-02-11 DIAGNOSIS — I493 Ventricular premature depolarization: Secondary | ICD-10-CM | POA: Diagnosis not present

## 2018-02-11 DIAGNOSIS — Z0000 Encounter for general adult medical examination without abnormal findings: Secondary | ICD-10-CM | POA: Diagnosis not present

## 2018-02-11 DIAGNOSIS — C50919 Malignant neoplasm of unspecified site of unspecified female breast: Secondary | ICD-10-CM | POA: Diagnosis not present

## 2018-02-11 DIAGNOSIS — Z6831 Body mass index (BMI) 31.0-31.9, adult: Secondary | ICD-10-CM | POA: Diagnosis not present

## 2018-02-11 DIAGNOSIS — Z1389 Encounter for screening for other disorder: Secondary | ICD-10-CM | POA: Diagnosis not present

## 2018-02-11 DIAGNOSIS — J45998 Other asthma: Secondary | ICD-10-CM | POA: Diagnosis not present

## 2018-02-11 DIAGNOSIS — M542 Cervicalgia: Secondary | ICD-10-CM | POA: Diagnosis not present

## 2018-02-11 DIAGNOSIS — E668 Other obesity: Secondary | ICD-10-CM | POA: Diagnosis not present

## 2018-06-20 DIAGNOSIS — Z23 Encounter for immunization: Secondary | ICD-10-CM | POA: Diagnosis not present

## 2018-07-22 ENCOUNTER — Encounter: Payer: Self-pay | Admitting: Allergy and Immunology

## 2018-07-22 ENCOUNTER — Ambulatory Visit (INDEPENDENT_AMBULATORY_CARE_PROVIDER_SITE_OTHER): Payer: PPO | Admitting: Allergy and Immunology

## 2018-07-22 VITALS — BP 140/78 | HR 84 | Resp 20

## 2018-07-22 DIAGNOSIS — K219 Gastro-esophageal reflux disease without esophagitis: Secondary | ICD-10-CM

## 2018-07-22 DIAGNOSIS — J4540 Moderate persistent asthma, uncomplicated: Secondary | ICD-10-CM | POA: Diagnosis not present

## 2018-07-22 DIAGNOSIS — J3089 Other allergic rhinitis: Secondary | ICD-10-CM | POA: Diagnosis not present

## 2018-07-22 MED ORDER — DEXLANSOPRAZOLE 60 MG PO CPDR: DELAYED_RELEASE_CAPSULE | ORAL | 1 refills | Status: DC

## 2018-07-22 MED ORDER — BUDESONIDE-FORMOTEROL FUMARATE 160-4.5 MCG/ACT IN AERO: 2.0000 | INHALATION_SPRAY | Freq: Two times a day (BID) | RESPIRATORY_TRACT | 0 refills | Status: DC

## 2018-07-22 NOTE — Progress Notes (Signed)
Follow-up Note  Referring Provider: Crist Infante, MD Primary Provider: Crist Infante, MD Date of Office Visit: 07/22/2018  Subjective:   Allison Clark (DOB: 03/26/1951) is a 67 y.o. female who returns to the [facility] on 07/22/2018 in re-evaluation of the following:  HPI: Allison Clark presents to this clinic in reevaluation of asthma and allergic rhinitis and LPR.  Her last visit to this clinic was 21 January 2018.  She has noticed that over the course of the past several months she has been having problems at nighttime with her breathing.  She has woken up many nights per week feeling as though she cannot take a deep breath.  She will sit up and use her short acting bronchodilator and all of her symptoms resolve and then she can go back to bed.  She does not have any associated choking or gasping or gagging or reflux symptoms and she has not been having any significant issues with her upper airway during this timeframe.  There is no chest pain or sputum production.  There has been a change in her environment.  She has let cats into the household during the early evening.  She has about 4 hours of cat exposure per day on most days.  Allergies as of 07/22/2018      Reactions   Avelox [moxifloxacin Hcl In Nacl] Hives   Sulfa Antibiotics Rash      Medication List      aspirin EC 81 MG tablet Take 81 mg by mouth at bedtime.   dexlansoprazole 60 MG capsule Commonly known as:  DEXILANT Take one capsule every morning before breakfast   fluticasone 50 MCG/ACT nasal spray Commonly known as:  FLONASE Place 1 spray into both nostrils daily.   Fluticasone Furoate 200 MCG/ACT Aepb Inhale 1 Dose into the lungs daily. Rinse, gargle, and spit after use.   levalbuterol 45 MCG/ACT inhaler Commonly known as:  XOPENEX HFA INHALE 2 PUFFS BY MOUTH EVERY 4 TO 6 HOURS AS NEEDED FOR COUGH OR WHEEZE   levothyroxine 112 MCG tablet Commonly known as:  SYNTHROID, LEVOTHROID Take 112  mcg by mouth daily before breakfast.   loratadine 10 MG tablet Commonly known as:  CLARITIN Take 10 mg by mouth daily.   olmesartan 20 MG tablet Commonly known as:  BENICAR Take 20 mg by mouth daily.   omega-3 acid ethyl esters 1 g capsule Commonly known as:  LOVAZA Take 1 g by mouth at bedtime.   simvastatin 20 MG tablet Commonly known as:  ZOCOR Take 20 mg by mouth at bedtime.       Past Medical History:  Diagnosis Date  . Asthma   . BBB (bundle branch block)    left  . Breast cancer ([place])   . GERD (gastroesophageal reflux disease)   . Thyroid disease     Past Surgical History:  Procedure Laterality Date  . BREAST LUMPECTOMY Left    malignant  . BREAST SURGERY    . STERILIZATION    . THYROIDECTOMY    . TONSILLECTOMY    . TUBAL LIGATION      Review of systems negative except as noted in HPI / PMHx or noted below:  Review of Systems  Constitutional: Negative.   HENT: Negative.   Eyes: Negative.   Respiratory: Negative.   Cardiovascular: Negative.   Gastrointestinal: Negative.   Genitourinary: Negative.   Musculoskeletal: Negative.   Skin: Negative.   Neurological: Negative.   Endo/Heme/Allergies: Negative.  Psychiatric/Behavioral: Negative.      Objective:   Vitals:   07/22/18 1451 07/22/18 1532  BP: (!) 152/74 140/78  Pulse: 84   Resp: 20           Physical Exam  HENT:  Head: Normocephalic.  Right Ear: Tympanic membrane, external ear and ear canal normal.  Left Ear: Tympanic membrane, external ear and ear canal normal.  Nose: Nose normal. No mucosal edema or rhinorrhea.  Mouth/Throat: Uvula is midline, oropharynx is clear and moist and mucous membranes are normal. No oropharyngeal exudate.  Eyes: Conjunctivae are normal.  Neck: Trachea normal. No tracheal tenderness present. No tracheal deviation present. No thyromegaly present.  Cardiovascular: Normal rate, regular rhythm, S1 normal, S2 normal and normal heart sounds.  No murmur  heard. Pulmonary/Chest: Breath sounds normal. No stridor. No respiratory distress. She has no wheezes. She has no rales.  Musculoskeletal: She exhibits no edema.  Lymphadenopathy:       Head (right side): No tonsillar adenopathy present.       Head (left side): No tonsillar adenopathy present.    She has no cervical adenopathy.  Neurological: She is alert.  Skin: No rash noted. She is not diaphoretic. No erythema. Nails show no clubbing.    Diagnostics:    Spirometry was performed and demonstrated an FEV1 of 1.48 at 62 % of predicted.  The patient had an Asthma Control Test with the following results: ACT Total Score: 18.    Assessment and Plan:   1. Not well controlled moderate persistent asthma   2. Other allergic rhinitis   3. Laryngopharyngeal reflux (LPR)     1.  Start Symbicort 160 - 2 inhalations twice a day with spacer to replace Arnuity  2. "Action plan" for asthma flare up:   A. Add Arnuity 200 - 2 inhalations twice a day  B. use Xopenex HFA if needed  3. Continue Dexilant 60mg  one tablet daily  4. Continue Flonase, and Claritin if needed  5. Obtain chest x-ray  6. Return in 3 weeks or earlier if problem  I am going to have Allison Clark use a combination inhaler and a higher dose of inhaled steroid throughout the week to address possible inflammation giving rise to her nocturnal symptoms.  To be complete we will obtain a chest x-ray as her last chest x-ray was over 6 years ago.  This may all be secondary to cat exposure that is present in the evening over the course of the past several months or there may be some other etiologic factor contributing to this issue.  I will regroup with her in 3 weeks to consider further evaluation and treatment pending her response to the treatment noted above.  [name], MD Allergy / Immunology [place]

## 2018-07-22 NOTE — Patient Instructions (Addendum)
  1.  Start Symbicort 160 - 2 inhalations twice a day with spacer to replace Arnuity  2. "Action plan" for asthma flare up:   A. Add Arnuity 200 - 2 inhalations twice a day  B. use Xopenex HFA if needed  3. Continue Dexilant 60mg  one tablet daily  4. Continue Flonase, and Claritin if needed  5. Obtain chest x-ray  6. Return in 3 weeks or earlier if problem

## 2018-07-23 ENCOUNTER — Ambulatory Visit
Admission: RE | Admit: 2018-07-23 | Discharge: 2018-07-23 | Disposition: A | Discharge status: Home / Self Care | Payer: PPO | Source: Ambulatory Visit | Attending: Allergy and Immunology | Admitting: Allergy and Immunology

## 2018-07-23 ENCOUNTER — Encounter: Payer: Self-pay | Admitting: Allergy and Immunology

## 2018-07-23 DIAGNOSIS — J984 Other disorders of lung: Secondary | ICD-10-CM | POA: Diagnosis not present

## 2018-08-12 ENCOUNTER — Ambulatory Visit (INDEPENDENT_AMBULATORY_CARE_PROVIDER_SITE_OTHER): Payer: PPO | Admitting: Allergy and Immunology

## 2018-08-12 ENCOUNTER — Encounter: Payer: Self-pay | Admitting: Allergy and Immunology

## 2018-08-12 VITALS — BP 110/62 | HR 100 | Resp 18

## 2018-08-12 DIAGNOSIS — J3089 Other allergic rhinitis: Secondary | ICD-10-CM

## 2018-08-12 DIAGNOSIS — K219 Gastro-esophageal reflux disease without esophagitis: Secondary | ICD-10-CM

## 2018-08-12 DIAGNOSIS — J4540 Moderate persistent asthma, uncomplicated: Secondary | ICD-10-CM

## 2018-08-12 NOTE — Patient Instructions (Addendum)
  1.  Continue Symbicort 160 - 2 inhalations twice a day with spacer to replace Arnuity  2. "Action plan" for asthma flare up:   A. Add Arnuity 200 - 2 inhalations twice a day  B. use Xopenex HFA if needed  3. Continue Dexilant 60mg  one tablet daily  4. Continue Flonase, and Claritin if needed  5. Return in February 2020 or earlier if problem

## 2018-08-12 NOTE — Progress Notes (Signed)
Follow-up Note  Referring Provider: Crist Infante, MD Primary Provider: Crist Infante, MD Date of Office Visit: 08/12/2018  Subjective:   Allison Clark (DOB: 1951-03-21) is a 67 y.o. female who returns to the [facility] on 08/12/2018 in re-evaluation of the following:  HPI: Allison Clark returns to this clinic in reevaluation of her asthma and allergic rhinitis and LPR.  I last saw her in his clinic on 22 July 2018 at which point time she appeared to be having a exacerbation of her asthma for which we gave her a combination inhaler to replace her Arnuity.  She has really done so much better.  She has resolved all of her breathing issues and she no longer wakes up at nighttime with difficulty and she no longer uses a short acting bronchodilator.  This is the best that he she has felt in months.  Her reflux still remains under very good control and she has had very little problems with her nose.  Allergies as of 08/12/2018      Reactions   Avelox [moxifloxacin Hcl In Nacl] Hives   Sulfa Antibiotics Rash      Medication List      aspirin EC 81 MG tablet Take 81 mg by mouth at bedtime.   budesonide-formoterol 160-4.5 MCG/ACT inhaler Commonly known as:  SYMBICORT Inhale 2 puffs into the lungs 2 (two) times daily. Rinse, gargle, and spit after use.   dexlansoprazole 60 MG capsule Commonly known as:  DEXILANT Take one capsule every morning before breakfast   fluticasone 50 MCG/ACT nasal spray Commonly known as:  FLONASE Place 1 spray into both nostrils daily.   Fluticasone Furoate 200 MCG/ACT Aepb Inhale 1 Dose into the lungs daily. Rinse, gargle, and spit after use.   levalbuterol 45 MCG/ACT inhaler Commonly known as:  XOPENEX HFA INHALE 2 PUFFS BY MOUTH EVERY 4 TO 6 HOURS AS NEEDED FOR COUGH OR WHEEZE   levothyroxine 112 MCG tablet Commonly known as:  SYNTHROID, LEVOTHROID Take 112 mcg by mouth daily before breakfast.   loratadine 10 MG tablet Commonly  known as:  CLARITIN Take 10 mg by mouth daily.   olmesartan 20 MG tablet Commonly known as:  BENICAR Take 20 mg by mouth daily.   omega-3 acid ethyl esters 1 g capsule Commonly known as:  LOVAZA Take 1 g by mouth at bedtime.   simvastatin 20 MG tablet Commonly known as:  ZOCOR Take 20 mg by mouth at bedtime.       Past Medical History:  Diagnosis Date  . Asthma   . BBB (bundle branch block)    left  . Breast cancer ([place])   . GERD (gastroesophageal reflux disease)   . Thyroid disease     Past Surgical History:  Procedure Laterality Date  . BREAST LUMPECTOMY Left    malignant  . BREAST SURGERY    . STERILIZATION    . THYROIDECTOMY    . TONSILLECTOMY    . TUBAL LIGATION      Review of systems negative except as noted in HPI / PMHx or noted below:  Review of Systems  Constitutional: Negative.   HENT: Negative.   Eyes: Negative.   Respiratory: Negative.   Cardiovascular: Negative.   Gastrointestinal: Negative.   Genitourinary: Negative.   Musculoskeletal: Negative.   Skin: Negative.   Neurological: Negative.   Endo/Heme/Allergies: Negative.   Psychiatric/Behavioral: Negative.      Objective:   Vitals:   08/12/18 1537  BP: 110/62  Pulse: 100  Resp: 18          Physical Exam  HENT:  Head: Normocephalic.  Right Ear: Tympanic membrane, external ear and ear canal normal.  Left Ear: Tympanic membrane, external ear and ear canal normal.  Nose: Nose normal. No mucosal edema or rhinorrhea.  Mouth/Throat: Uvula is midline, oropharynx is clear and moist and mucous membranes are normal. No oropharyngeal exudate.  Eyes: Conjunctivae are normal.  Neck: Trachea normal. No tracheal tenderness present. No tracheal deviation present. No thyromegaly present.  Cardiovascular: Normal rate, regular rhythm, S1 normal, S2 normal and normal heart sounds.  No murmur heard. Pulmonary/Chest: Breath sounds normal. No stridor. No respiratory distress. She has no wheezes.  She has no rales.  Musculoskeletal: She exhibits no edema.  Lymphadenopathy:       Head (right side): No tonsillar adenopathy present.       Head (left side): No tonsillar adenopathy present.    She has no cervical adenopathy.  Neurological: She is alert.  Skin: No rash noted. She is not diaphoretic. No erythema. Nails show no clubbing.    Diagnostics:    Spirometry was performed and demonstrated an FEV1 of 1.64 at 69 % of predicted.  The patient had an Asthma Control Test with the following results: ACT Total Score: 24.    Results of the chest x-ray obtained 23 July 2018 identified the following:  Bibasilar scarring.  Borderline cardiomegaly.  Assessment and Plan:   1. Asthma, moderate persistent, well-controlled   2. Other allergic rhinitis   3. Laryngopharyngeal reflux (LPR)     1.  Continue Symbicort 160 - 2 inhalations twice a day with spacer to replace Arnuity  2. "Action plan" for asthma flare up:   A. Add Arnuity 200 - 2 inhalations twice a day  B. use Xopenex HFA if needed  3. Continue Dexilant 60mg  one tablet daily  4. Continue Flonase, and Claritin if needed  5. Return in February 2020 or earlier if problem  Allison Clark is doing quite well on her current plan and she will remain on Symbicort as her controller agent as well as other medications as noted above to address the inflammation of her airway and reflux and I will see her back in this clinic in February 2020 or earlier if there is a problem.   [name], MD Allergy / Immunology [place]

## 2018-08-13 ENCOUNTER — Encounter: Payer: Self-pay | Admitting: Allergy and Immunology

## 2018-08-21 ENCOUNTER — Other Ambulatory Visit: Payer: Self-pay | Admitting: Pharmacist

## 2018-08-21 NOTE — Patient Outreach (Signed)
[place] [facility]) Care Management  08/21/2018  RODINA PINALES September 01, 1951 [id number]   Incoming call from Wendi Maya in response to the [facility] Medication Adherence Campaign. Speak with patient. HIPAA identifiers verified and verbal consent received.  Ms. Tokarczyk reports that she takes her simvastatin 20 mg once daily as directed. Reports that she occasionally misses a dose when she falls asleep before taking it. Counsel patient on the importance of medication adherence. Reports that she currently uses a weekly pillbox and keeps this at her dining table. Reports no small children in home. Encourage patient to take dose earlier in the evening and to use a daily reminder alarm on her phone.   Patient denies any further medication questions/concerns at this time.   Will close pharmacy episode.  Harlow Asa, PharmD, [place] [facility] [phone]

## 2018-09-14 DIAGNOSIS — H00025 Hordeolum internum left lower eyelid: Secondary | ICD-10-CM | POA: Diagnosis not present

## 2018-11-09 ENCOUNTER — Ambulatory Visit: Payer: PPO | Admitting: Allergy and Immunology

## 2018-11-09 ENCOUNTER — Encounter: Payer: Self-pay | Admitting: Allergy and Immunology

## 2018-11-09 VITALS — BP 136/72 | HR 98 | Resp 18 | Ht 62.5 in | Wt 190.0 lb

## 2018-11-09 DIAGNOSIS — J3089 Other allergic rhinitis: Secondary | ICD-10-CM | POA: Diagnosis not present

## 2018-11-09 DIAGNOSIS — J4540 Moderate persistent asthma, uncomplicated: Secondary | ICD-10-CM

## 2018-11-09 DIAGNOSIS — K219 Gastro-esophageal reflux disease without esophagitis: Secondary | ICD-10-CM | POA: Diagnosis not present

## 2018-11-09 MED ORDER — BUDESONIDE-FORMOTEROL FUMARATE 160-4.5 MCG/ACT IN AERO: 2.0000 | INHALATION_SPRAY | Freq: Two times a day (BID) | RESPIRATORY_TRACT | 1 refills | Status: DC

## 2018-11-09 MED ORDER — BUDESONIDE-FORMOTEROL FUMARATE 160-4.5 MCG/ACT IN AERO: 2.0000 | INHALATION_SPRAY | Freq: Two times a day (BID) | RESPIRATORY_TRACT | 5 refills | Status: DC

## 2018-11-09 NOTE — Progress Notes (Signed)
Follow-up Note  Referring Provider: Crist Infante, MD Primary Provider: Crist Infante, MD Date of Office Visit: 11/09/2018  Subjective:   Allison Clark (DOB: Jun 27, 1951) is a 68 y.o. female who returns to the [facility] [place] on 11/09/2018 in re-evaluation of the following:  HPI: Allison Clark returns to this clinic in reevaluation of her asthma and allergic rhinitis and LPR.  Her last visit to this clinic was 12 August 2018.  She has continued to do well while consistently using her Symbicort twice a day and has had very little issue revolving around her respiratory track other than the fact that just recently she developed a small flare with some nasal congestion and some cough that for the most part appears to have resolved at this point.  Otherwise, she rarely uses a short acting bronchodilator.  She has had very little issues with her upper airways.  Her reflux has been under very good control and she has not been having any problems with her throat.  She did obtain the flu vaccine this fall.  Allergies as of 11/09/2018      Reactions   Avelox [moxifloxacin Hcl In Nacl] Hives   Sulfa Antibiotics Rash      Medication List      aspirin EC 81 MG tablet Take 81 mg by mouth at bedtime.   budesonide-formoterol 160-4.5 MCG/ACT inhaler Commonly known as:  SYMBICORT Inhale 2 puffs into the lungs 2 (two) times daily. Rinse, gargle, and spit after use.   dexlansoprazole 60 MG capsule Commonly known as:  DEXILANT Take one capsule every morning before breakfast   fluticasone 50 MCG/ACT nasal spray Commonly known as:  FLONASE Place 1 spray into both nostrils daily.   Fluticasone Furoate 200 MCG/ACT Aepb Commonly known as:  ARNUITY ELLIPTA Inhale 1 Dose into the lungs daily. Rinse, gargle, and spit after use.   levalbuterol 45 MCG/ACT inhaler Commonly known as:  XOPENEX HFA INHALE 2 PUFFS BY MOUTH EVERY 4 TO 6 HOURS AS NEEDED FOR COUGH OR WHEEZE   levothyroxine 112  MCG tablet Commonly known as:  SYNTHROID, LEVOTHROID Take 112 mcg by mouth daily before breakfast.   loratadine 10 MG tablet Commonly known as:  CLARITIN Take 10 mg by mouth daily.   olmesartan 20 MG tablet Commonly known as:  BENICAR Take 20 mg by mouth daily.   omega-3 acid ethyl esters 1 g capsule Commonly known as:  LOVAZA Take 1 g by mouth at bedtime.   simvastatin 20 MG tablet Commonly known as:  ZOCOR Take 20 mg by mouth at bedtime.       Past Medical History:  Diagnosis Date  . Asthma   . BBB (bundle branch block)    left  . Breast cancer ([place])   . GERD (gastroesophageal reflux disease)   . Thyroid disease     Past Surgical History:  Procedure Laterality Date  . BREAST LUMPECTOMY Left    malignant  . BREAST SURGERY    . STERILIZATION    . THYROIDECTOMY    . TONSILLECTOMY    . TUBAL LIGATION      Review of systems negative except as noted in HPI / PMHx or noted below:  Review of Systems  Constitutional: Negative.   HENT: Negative.   Eyes: Negative.   Respiratory: Negative.   Cardiovascular: Negative.   Gastrointestinal: Negative.   Genitourinary: Negative.   Musculoskeletal: Negative.   Skin: Negative.   Neurological: Negative.   Endo/Heme/Allergies: Negative.  Psychiatric/Behavioral: Negative.      Objective:   Vitals:   11/09/18 1542  BP: 136/72  Pulse: 98  Resp: 18  SpO2: 94%   Height: 5' 2.5" (158.8 cm)  Weight: 190 lb (86.2 kg)   Physical Exam Constitutional:      Appearance: She is not diaphoretic.  HENT:     Head: Normocephalic.     Right Ear: Tympanic membrane, ear canal and external ear normal.     Left Ear: Tympanic membrane, ear canal and external ear normal.     Nose: Nose normal. No mucosal edema or rhinorrhea.     Mouth/Throat:     Pharynx: Uvula midline. No oropharyngeal exudate.  Eyes:     Conjunctiva/sclera: Conjunctivae normal.  Neck:     Thyroid: No thyromegaly.     Trachea: Trachea normal. No tracheal  tenderness or tracheal deviation.  Cardiovascular:     Rate and Rhythm: Normal rate and regular rhythm.     Heart sounds: Normal heart sounds, S1 normal and S2 normal. No murmur.  Pulmonary:     Effort: No respiratory distress.     Breath sounds: Normal breath sounds. No stridor. No wheezing or rales.  Lymphadenopathy:     Head:     Right side of head: No tonsillar adenopathy.     Left side of head: No tonsillar adenopathy.     Cervical: No cervical adenopathy.  Skin:    Findings: No erythema or rash.     Nails: There is no clubbing.   Neurological:     Mental Status: She is alert.     Diagnostics:    Spirometry was performed and demonstrated an FEV1 of 1.76 at 78 % of predicted.  The patient had an Asthma Control Test with the following results: ACT Total Score: 22.    Assessment and Plan:   1. Asthma, moderate persistent, well-controlled   2. Other allergic rhinitis   3. Laryngopharyngeal reflux (LPR)     1.  Continue Symbicort 160 - 2 inhalations 1-2 times a day with spacer depending on disease activity  2. "Action plan" for asthma flare up:    A. Add Arnuity 200 - 2 inhalations twice a day  B. use Xopenex HFA if needed  3. Continue Dexilant 60mg  one tablet daily  4. Continue Flonase, and Claritin if needed  5. Return in Summer 2020 or earlier if problem  Allison Clark will now utilize a dose of Symbicort depending on the extent of her disease activity.  She appears to have a very good understanding of her disease state and how these medications work.  She will utilize Symbicort once a day if doing well and increase to twice a day should there be increased asthma activity.  As well she will remain on therapy for upper airway inflammation and reflux as noted above.  I will see her back in this clinic in summer 2020 or earlier if there is a problem.  Allison Katz, MD Allergy / Immunology [place]

## 2018-11-09 NOTE — Patient Instructions (Addendum)
  1.  Continue Symbicort 160 - 2 inhalations 1-2 times a day with spacer depending on disease activity  2. "Action plan" for asthma flare up:    A. Add Arnuity 200 - 2 inhalations twice a day  B. use Xopenex HFA if needed  3. Continue Dexilant 60mg  one tablet daily  4. Continue Flonase, and Claritin if needed  5. Return in Summer 2020 or earlier if problem

## 2018-11-10 ENCOUNTER — Encounter: Payer: Self-pay | Admitting: Allergy and Immunology

## 2018-11-12 ENCOUNTER — Other Ambulatory Visit: Payer: Self-pay | Admitting: Obstetrics and Gynecology

## 2018-11-12 DIAGNOSIS — Z1231 Encounter for screening mammogram for malignant neoplasm of breast: Secondary | ICD-10-CM

## 2018-12-08 ENCOUNTER — Ambulatory Visit
Admission: RE | Admit: 2018-12-08 | Discharge: 2018-12-08 | Disposition: A | Discharge status: Home / Self Care | Payer: PPO | Source: Ambulatory Visit | Attending: Obstetrics and Gynecology | Admitting: Obstetrics and Gynecology

## 2018-12-08 DIAGNOSIS — Z1231 Encounter for screening mammogram for malignant neoplasm of breast: Secondary | ICD-10-CM

## 2018-12-21 ENCOUNTER — Other Ambulatory Visit: Payer: Self-pay | Admitting: Obstetrics and Gynecology

## 2018-12-21 DIAGNOSIS — M79629 Pain in unspecified upper arm: Secondary | ICD-10-CM | POA: Diagnosis not present

## 2018-12-21 DIAGNOSIS — Z01419 Encounter for gynecological examination (general) (routine) without abnormal findings: Secondary | ICD-10-CM | POA: Diagnosis not present

## 2018-12-21 DIAGNOSIS — Z124 Encounter for screening for malignant neoplasm of cervix: Secondary | ICD-10-CM | POA: Diagnosis not present

## 2018-12-21 DIAGNOSIS — Z130 Encounter for screening for diseases of the blood and blood-forming organs and certain disorders involving the immune mechanism: Secondary | ICD-10-CM | POA: Diagnosis not present

## 2018-12-21 DIAGNOSIS — Z1389 Encounter for screening for other disorder: Secondary | ICD-10-CM | POA: Diagnosis not present

## 2018-12-21 DIAGNOSIS — Z853 Personal history of malignant neoplasm of breast: Secondary | ICD-10-CM

## 2018-12-21 DIAGNOSIS — M79622 Pain in left upper arm: Secondary | ICD-10-CM

## 2018-12-24 ENCOUNTER — Ambulatory Visit
Admission: RE | Admit: 2018-12-24 | Discharge: 2018-12-24 | Disposition: A | Discharge status: Home / Self Care | Payer: PPO | Source: Ambulatory Visit | Attending: Obstetrics and Gynecology | Admitting: Obstetrics and Gynecology

## 2018-12-24 DIAGNOSIS — M79622 Pain in left upper arm: Secondary | ICD-10-CM | POA: Diagnosis not present

## 2018-12-24 DIAGNOSIS — Z853 Personal history of malignant neoplasm of breast: Secondary | ICD-10-CM

## 2019-02-09 ENCOUNTER — Other Ambulatory Visit: Payer: Self-pay | Admitting: Allergy and Immunology

## 2019-03-10 DIAGNOSIS — I10 Essential (primary) hypertension: Secondary | ICD-10-CM | POA: Diagnosis not present

## 2019-03-10 DIAGNOSIS — E038 Other specified hypothyroidism: Secondary | ICD-10-CM | POA: Diagnosis not present

## 2019-03-10 DIAGNOSIS — E7849 Other hyperlipidemia: Secondary | ICD-10-CM | POA: Diagnosis not present

## 2019-03-10 DIAGNOSIS — M859 Disorder of bone density and structure, unspecified: Secondary | ICD-10-CM | POA: Diagnosis not present

## 2019-03-12 DIAGNOSIS — R82998 Other abnormal findings in urine: Secondary | ICD-10-CM | POA: Diagnosis not present

## 2019-03-17 DIAGNOSIS — C50919 Malignant neoplasm of unspecified site of unspecified female breast: Secondary | ICD-10-CM | POA: Diagnosis not present

## 2019-03-17 DIAGNOSIS — Z1331 Encounter for screening for depression: Secondary | ICD-10-CM | POA: Diagnosis not present

## 2019-03-17 DIAGNOSIS — M79602 Pain in left arm: Secondary | ICD-10-CM

## 2019-03-17 DIAGNOSIS — E785 Hyperlipidemia, unspecified: Secondary | ICD-10-CM | POA: Diagnosis not present

## 2019-03-17 DIAGNOSIS — E039 Hypothyroidism, unspecified: Secondary | ICD-10-CM | POA: Diagnosis not present

## 2019-03-17 DIAGNOSIS — I10 Essential (primary) hypertension: Secondary | ICD-10-CM | POA: Diagnosis not present

## 2019-03-17 DIAGNOSIS — M542 Cervicalgia: Secondary | ICD-10-CM | POA: Diagnosis not present

## 2019-03-17 DIAGNOSIS — J302 Other seasonal allergic rhinitis: Secondary | ICD-10-CM | POA: Diagnosis not present

## 2019-03-17 DIAGNOSIS — M1990 Unspecified osteoarthritis, unspecified site: Secondary | ICD-10-CM | POA: Diagnosis not present

## 2019-03-17 DIAGNOSIS — J45909 Unspecified asthma, uncomplicated: Secondary | ICD-10-CM | POA: Diagnosis not present

## 2019-03-17 DIAGNOSIS — M8580 Other specified disorders of bone density and structure, unspecified site: Secondary | ICD-10-CM | POA: Diagnosis not present

## 2019-03-17 DIAGNOSIS — Z0000 Encounter for general adult medical examination without abnormal findings: Secondary | ICD-10-CM | POA: Diagnosis not present

## 2019-03-17 HISTORY — DX: Pain in left arm: M79.602

## 2019-05-12 ENCOUNTER — Other Ambulatory Visit: Payer: Self-pay

## 2019-05-12 ENCOUNTER — Ambulatory Visit (INDEPENDENT_AMBULATORY_CARE_PROVIDER_SITE_OTHER): Payer: PPO | Admitting: Allergy and Immunology

## 2019-05-12 ENCOUNTER — Encounter: Payer: Self-pay | Admitting: Allergy and Immunology

## 2019-05-12 VITALS — BP 108/68 | HR 80 | Temp 97.7°F | Resp 18

## 2019-05-12 DIAGNOSIS — K219 Gastro-esophageal reflux disease without esophagitis: Secondary | ICD-10-CM | POA: Diagnosis not present

## 2019-05-12 DIAGNOSIS — J4540 Moderate persistent asthma, uncomplicated: Secondary | ICD-10-CM

## 2019-05-12 DIAGNOSIS — J3089 Other allergic rhinitis: Secondary | ICD-10-CM | POA: Diagnosis not present

## 2019-05-12 MED ORDER — LEVALBUTEROL TARTRATE 45 MCG/ACT IN AERO: INHALATION_SPRAY | RESPIRATORY_TRACT | 1 refills | Status: DC

## 2019-05-12 NOTE — Progress Notes (Signed)
[place] [facility] [place] [facility] [place]   Follow-up Note  Referring Provider: Crist Infante, MD Primary Provider: Crist Infante, MD Date of Office Visit: 05/12/2019  Subjective:   Allison Clark (DOB: 1951-03-06) is a 68 y.o. female who returns to the [facility] [place] on 05/12/2019 in re-evaluation of the following:  HPI: Chanci returns to this clinic in reevaluation of asthma and allergic rhinitis and LPR.  Her last visit to this clinic was 09 November 2018.  Her asthma has been under excellent control without the need for a systemic steroid or antibiotic and rare use of a short acting bronchodilator.  She does not really exercise to any significant amount.  She has not had to activate her "action plan" for asthma flareup.  She has been using Symbicort mostly 1 time per day.  Her nose has not been causing her problem while intermittently using some nasal steroid.   Her reflux is under excellent control as is the issue with her throat while she uses Dexilant on a consistent basis.  Allergies as of 05/12/2019      Reactions   Avelox [moxifloxacin Hcl In Nacl] Hives   Sulfa Antibiotics Rash      Medication List    budesonide-formoterol 160-4.5 MCG/ACT inhaler Commonly known as: Symbicort Inhale 2 puffs into the lungs 2 (two) times daily. Rinse, gargle, and spit after use.   dexlansoprazole 60 MG capsule Commonly known as: Dexilant TAKE 1 CAPSULE BY MOUTH BEFORE BREAKFAST   fluticasone 50 MCG/ACT nasal spray Commonly known as: FLONASE Place 1 spray into both nostrils daily.   Fluticasone Furoate 200 MCG/ACT Aepb Commonly known as: Arnuity Ellipta Inhale 1 Dose into the lungs daily. Rinse, gargle, and spit after use.   levalbuterol 45 MCG/ACT inhaler Commonly known as: XOPENEX HFA INHALE 2 PUFFS BY MOUTH EVERY 4 TO 6 HOURS AS NEEDED FOR COUGH OR WHEEZE   levothyroxine 112 MCG tablet Commonly known as: SYNTHROID Take 112 mcg by mouth daily  before breakfast.   loratadine 10 MG tablet Commonly known as: CLARITIN Take 10 mg by mouth daily.   olmesartan 20 MG tablet Commonly known as: BENICAR Take 20 mg by mouth daily.   simvastatin 20 MG tablet Commonly known as: ZOCOR Take 20 mg by mouth at bedtime.       Past Medical History:  Diagnosis Date  . Asthma   . BBB (bundle branch block)    left  . Breast cancer [facility]) 2004   Left Breast Cancer  . GERD (gastroesophageal reflux disease)   . Thyroid disease     Past Surgical History:  Procedure Laterality Date  . BREAST LUMPECTOMY Left 2004   malignant  . BREAST SURGERY    . STERILIZATION    . THYROIDECTOMY    . TONSILLECTOMY    . TUBAL LIGATION      Review of systems negative except as noted in HPI / PMHx or noted below:  Review of Systems  Constitutional: Negative.   HENT: Negative.   Eyes: Negative.   Respiratory: Negative.   Cardiovascular: Negative.   Gastrointestinal: Negative.   Genitourinary: Negative.   Musculoskeletal: Negative.   Skin: Negative.   Neurological: Negative.   Endo/Heme/Allergies: Negative.   Psychiatric/Behavioral: Negative.      Objective:   Vitals:   05/12/19 1505  BP: 108/68  Pulse: 80  Resp: 18  Temp: 97.7 F (36.5 C)  SpO2: 94%  Physical Exam Constitutional:      Appearance: She is not diaphoretic.  HENT:     Head: Normocephalic.     Right Ear: Tympanic membrane, ear canal and external ear normal.     Left Ear: Tympanic membrane, ear canal and external ear normal.     Nose: Nose normal. No mucosal edema or rhinorrhea.     Mouth/Throat:     Pharynx: Uvula midline. No oropharyngeal exudate.  Eyes:     Conjunctiva/sclera: Conjunctivae normal.  Neck:     Thyroid: No thyromegaly.     Trachea: Trachea normal. No tracheal tenderness or tracheal deviation.  Cardiovascular:     Rate and Rhythm: Normal rate and regular rhythm.     Heart sounds: Normal heart sounds, S1 normal and S2 normal. No  murmur.  Pulmonary:     Effort: No respiratory distress.     Breath sounds: Normal breath sounds. No stridor. No wheezing or rales.  Lymphadenopathy:     Head:     Right side of head: No tonsillar adenopathy.     Left side of head: No tonsillar adenopathy.     Cervical: No cervical adenopathy.  Skin:    Findings: No erythema or rash.     Nails: There is no clubbing.   Neurological:     Mental Status: She is alert.     Diagnostics:    Spirometry was performed and demonstrated an FEV1 of 1.78 at 80 % of predicted.   Assessment and Plan:   1. Asthma, moderate persistent, well-controlled   2. Other allergic rhinitis   3. Laryngopharyngeal reflux (LPR)     1.  Continue Symbicort 160 - 2 inhalations 1-2 times a day with spacer depending on disease activity  2. "Action plan" for asthma flare up:    A. Add Arnuity 200 - 2 inhalations twice a day  B. use Xopenex HFA if needed  3. Continue Dexilant 60mg  one tablet daily  4. Continue Flonase, and Claritin if needed  5. Return in 6 months or earlier if problem  6.  Obtain fall flu vaccine (and COVID vaccine)  Dilan appears to be doing quite well on her current plan of therapy which includes anti-inflammatory agents for her airway and therapy directed against reflux.  Assuming she continues to do well with this plan I will see her back in this clinic in 6 months or earlier if there is a problem.  [name], MD Allergy / Immunology [place]

## 2019-05-12 NOTE — Patient Instructions (Signed)
  1.  Continue Symbicort 160 - 2 inhalations 1-2 times a day with spacer depending on disease activity  2. "Action plan" for asthma flare up:    A. Add Arnuity 200 - 2 inhalations twice a day  B. use Xopenex HFA if needed  3. Continue Dexilant 60mg  one tablet daily  4. Continue Flonase, and Claritin if needed  5. Return in 6 months or earlier if problem  6.  Obtain fall flu vaccine (and COVID vaccine)

## 2019-05-13 ENCOUNTER — Encounter: Payer: Self-pay | Admitting: Allergy and Immunology

## 2019-06-26 DIAGNOSIS — Z23 Encounter for immunization: Secondary | ICD-10-CM | POA: Diagnosis not present

## 2019-08-09 ENCOUNTER — Other Ambulatory Visit: Payer: Self-pay | Admitting: Allergy and Immunology

## 2019-11-03 ENCOUNTER — Other Ambulatory Visit: Payer: Self-pay

## 2019-11-03 ENCOUNTER — Ambulatory Visit (INDEPENDENT_AMBULATORY_CARE_PROVIDER_SITE_OTHER): Payer: PPO | Admitting: Allergy and Immunology

## 2019-11-03 VITALS — BP 122/72 | HR 80 | Temp 97.6°F | Resp 18 | Ht 63.2 in | Wt 192.8 lb

## 2019-11-03 DIAGNOSIS — J4540 Moderate persistent asthma, uncomplicated: Secondary | ICD-10-CM

## 2019-11-03 DIAGNOSIS — J3089 Other allergic rhinitis: Secondary | ICD-10-CM

## 2019-11-03 DIAGNOSIS — K219 Gastro-esophageal reflux disease without esophagitis: Secondary | ICD-10-CM

## 2019-11-03 MED ORDER — DEXILANT 60 MG PO CPDR: DELAYED_RELEASE_CAPSULE | ORAL | 1 refills | Status: DC

## 2019-11-03 MED ORDER — BUDESONIDE-FORMOTEROL FUMARATE 160-4.5 MCG/ACT IN AERO: 2.0000 | INHALATION_SPRAY | Freq: Two times a day (BID) | RESPIRATORY_TRACT | 1 refills | Status: DC

## 2019-11-03 MED ORDER — ARNUITY ELLIPTA 200 MCG/ACT IN AEPB: 2.0000 | INHALATION_SPRAY | Freq: Two times a day (BID) | RESPIRATORY_TRACT | 1 refills | Status: DC | PRN

## 2019-11-03 NOTE — Progress Notes (Signed)
[place] [facility] [place]   Follow-up Note  Referring Provider: Crist Infante, MD Primary Provider: Crist Infante, MD Date of Office Visit: 11/03/2019  Subjective:   Allison Clark (DOB: 1951/07/25) is a 69 y.o. female who returns to the [facility] [place] on 11/03/2019 in re-evaluation of the following:  HPI: Nakila presents to this clinic in evaluation of asthma and allergic rhinitis and LPR.  Her last visit to this clinic was 12 May 2019.  She was doing wonderful up until 10 days ago.  Prior to that point in time she rarely had any issues with her upper or lower respiratory tract and rarely used a short acting bronchodilator and did not have the need to utilize a systemic steroid or antibiotic for any type of airway issue and overall felt as though both her nose and chest were doing well while consistently using Symbicort 1 time per day and Flonase.  Unfortunately, 10 days ago she developed hoarseness and nasal congestion and clear rhinorrhea and a little bit of a cough.  She feels that this was secondary to exposure to [employer] [place] furniture polish that her husband was using.  Apparently this whole event developed within an hour of that exposure.  She has improved somewhat but still continues to have congestion of her head without any fever or anosmia or ugly nasal discharge and still has a slight cough without any chest pain or sputum production or significant shortness of breath or chest tightness.  Her reflux remains under very good control on her current plan.  She did receive the flu vaccine.  Allergies as of 11/03/2019      Reactions   Avelox [moxifloxacin Hcl In Nacl] Hives   Sulfa Antibiotics Rash      Medication List      budesonide-formoterol 160-4.5 MCG/ACT inhaler Commonly known as: Symbicort Inhale 2 puffs into the lungs 2 (two) times daily. Rinse, gargle, and spit after use.   Dexilant 60 MG capsule Generic drug:  dexlansoprazole TAKE ONE CAPSULE BY MOUTH BEFORE BREAKFAST   fluticasone 50 MCG/ACT nasal spray Commonly known as: FLONASE Place 1 spray into both nostrils daily.   Fluticasone Furoate 200 MCG/ACT Aepb Commonly known as: Arnuity Ellipta Inhale 1 Dose into the lungs daily. Rinse, gargle, and spit after use.   levalbuterol 45 MCG/ACT inhaler Commonly known as: XOPENEX HFA INHALE 2 PUFFS BY MOUTH EVERY 4 TO 6 HOURS AS NEEDED FOR COUGH OR WHEEZE   levothyroxine 112 MCG tablet Commonly known as: SYNTHROID Take 112 mcg by mouth daily before breakfast.   loratadine 10 MG tablet Commonly known as: CLARITIN Take 10 mg by mouth daily.   olmesartan 20 MG tablet Commonly known as: BENICAR Take 20 mg by mouth daily.   simvastatin 20 MG tablet Commonly known as: ZOCOR Take 20 mg by mouth at bedtime.       Past Medical History:  Diagnosis Date  . Asthma   . BBB (bundle branch block)    left  . Breast cancer [facility]) 2004   Left Breast Cancer  . GERD (gastroesophageal reflux disease)   . Thyroid disease     Past Surgical History:  Procedure Laterality Date  . BREAST LUMPECTOMY Left 2004   malignant  . BREAST SURGERY    . STERILIZATION    . THYROIDECTOMY    . TONSILLECTOMY    . TUBAL LIGATION      Review of systems negative except as noted  in HPI / PMHx or noted below:  Review of Systems  Constitutional: Negative.   HENT: Negative.   Eyes: Negative.   Respiratory: Negative.   Cardiovascular: Negative.   Gastrointestinal: Negative.   Genitourinary: Negative.   Musculoskeletal: Negative.   Skin: Negative.   Neurological: Negative.   Endo/Heme/Allergies: Negative.   Psychiatric/Behavioral: Negative.      Objective:   Vitals:   11/03/19 1026  BP: 122/72  Pulse: 80  Resp: 18  Temp: 97.6 F (36.4 C)  SpO2: 93%   Height: 5' 3.2" (160.5 cm)  Weight: 192 lb 12.8 oz (87.5 kg)   Physical Exam Constitutional:      Appearance: She is not diaphoretic.  HENT:      Head: Normocephalic.     Right Ear: Tympanic membrane, ear canal and external ear normal.     Left Ear: Tympanic membrane, ear canal and external ear normal.     Nose: Nose normal. No mucosal edema or rhinorrhea.     Mouth/Throat:     Pharynx: Uvula midline. No oropharyngeal exudate.  Eyes:     Conjunctiva/sclera: Conjunctivae normal.  Neck:     Thyroid: No thyromegaly.     Trachea: Trachea normal. No tracheal tenderness or tracheal deviation.  Cardiovascular:     Rate and Rhythm: Normal rate and regular rhythm.     Heart sounds: Normal heart sounds, S1 normal and S2 normal. No murmur.  Pulmonary:     Effort: No respiratory distress.     Breath sounds: No stridor. Wheezing (Bilateral expiratory wheezes posterior lung fields) present. No rales.  Lymphadenopathy:     Head:     Right side of head: No tonsillar adenopathy.     Left side of head: No tonsillar adenopathy.     Cervical: No cervical adenopathy.  Skin:    Findings: No erythema or rash.     Nails: There is no clubbing.  Neurological:     Mental Status: She is alert.     Diagnostics:    Spirometry was performed and demonstrated an FEV1 of 1.46 at 65 % of predicted.  Assessment and Plan:   1. Asthma, moderate persistent, well-controlled   2. Other allergic rhinitis   3. Laryngopharyngeal reflux (LPR)     1.  Continue Symbicort 160 - 2 inhalations 1-2 times a day with spacer depending on disease activity  2. "Action plan" for asthma flare up:    A. Add Arnuity 200 - 2 inhalations twice a day  B. use Xopenex HFA if needed  3. Continue Dexilant 60mg  one tablet daily  4. Continue Flonase, and Claritin if needed  5. For this recent episode:   A. Activate "action plan"  B. Prednisone 10 mg - 1 tablet 1 time per day for 10 days  6. Return in 6 months or earlier if problem  7.  Obtain COVID vaccine  Sophie appears to be having an issue with respiratory tract inflammation of both her upper and lower  airway.  We will assume that this was secondary to the fume exposure she had with her husband furniture hobby.  Of course, this could be a respiratory tract infection given the fact that we are in the middle of a pandemic with coronavirus and this could be a coronavirus infection.  She is already 10 days into this issue and does not appear to be progressing regarding this problem.  She is self isolated at home and has not had any human-human contact since this event started  and will continue to remain away from human-human contact for an additional 7 days or until all of her symptoms abate.  I have treated her with some anti-inflammatory agents for her airway as noted above.  She will remain on her plan of therapy directed against respiratory tract inflammation and reflux which was working quite well prior to this event and I will assume she will continue to do well and see her back in this clinic in 6 months or earlier if there is a problem.  [name], MD Allergy / Immunology [place]

## 2019-11-03 NOTE — Patient Instructions (Signed)
  1.  Continue Symbicort 160 - 2 inhalations 1-2 times a day with spacer depending on disease activity  2. "Action plan" for asthma flare up:    A. Add Arnuity 200 - 2 inhalations twice a day  B. use Xopenex HFA if needed  3. Continue Dexilant 60mg  one tablet daily  4. Continue Flonase, and Claritin if needed  5. For this recent episode:   A. Activate "action plan"  B. Prednisone 10 mg - 1 tablet 1 time per day for 10 days  6. Return in 6 months or earlier if problem  7.  Obtain COVID vaccine

## 2019-11-04 ENCOUNTER — Encounter: Payer: Self-pay | Admitting: Allergy and Immunology

## 2019-11-22 ENCOUNTER — Ambulatory Visit: Payer: PPO | Attending: Internal Medicine

## 2019-11-22 DIAGNOSIS — Z23 Encounter for immunization: Secondary | ICD-10-CM

## 2019-11-22 NOTE — Progress Notes (Signed)
   Covid-19 Vaccination Clinic  Name:  Allison Clark    MRN: [id number] DOB: [birth year]  11/22/2019  [name] was observed post Covid-19 immunization for 30 minutes based on pre-vaccination screening without incidence. She was provided with Vaccine Information Sheet and instruction to access the V-Safe system.   [name] was instructed to call 911 with any severe reactions post vaccine: Marland Kitchen Difficulty breathing  . Swelling of your face and throat  . A fast heartbeat  . A bad rash all over your body  . Dizziness and weakness    Immunizations Administered    Name Date Dose VIS Date Route   Pfizer COVID-19 Vaccine 11/22/2019  2:18 PM 0.3 mL 09/24/2019 Intramuscular   Manufacturer: [place]   Lot: [id number]   [place]: [id number]

## 2019-12-03 ENCOUNTER — Other Ambulatory Visit: Payer: Self-pay | Admitting: Obstetrics and Gynecology

## 2019-12-03 DIAGNOSIS — Z1231 Encounter for screening mammogram for malignant neoplasm of breast: Secondary | ICD-10-CM

## 2019-12-17 ENCOUNTER — Ambulatory Visit: Payer: PPO | Attending: Internal Medicine

## 2019-12-17 DIAGNOSIS — Z23 Encounter for immunization: Secondary | ICD-10-CM | POA: Insufficient documentation

## 2019-12-17 NOTE — Progress Notes (Signed)
   Covid-19 Vaccination Clinic  Name:  Allison Clark    MRN: [id number] DOB: 04-Jun-1951  12/17/2019  [name] was observed post Covid-19 immunization for 30 minutes based on pre-vaccination screening without incident. She was provided with Vaccine Information Sheet and instruction to access the V-Safe system.   [name] was instructed to call 911 with any severe reactions post vaccine: Marland Kitchen Difficulty breathing  . Swelling of face and throat  . A fast heartbeat  . A bad rash all over body  . Dizziness and weakness   Immunizations Administered    Name Date Dose VIS Date Route   Pfizer COVID-19 Vaccine 12/17/2019  8:59 AM 0.3 mL 09/24/2019 Intramuscular   Manufacturer: [place]   Lot: [id number]   [place]: [id number]

## 2020-01-17 ENCOUNTER — Ambulatory Visit
Admission: RE | Admit: 2020-01-17 | Discharge: 2020-01-17 | Disposition: A | Discharge status: Home / Self Care | Payer: PPO | Source: Ambulatory Visit | Attending: Obstetrics and Gynecology | Admitting: Obstetrics and Gynecology

## 2020-01-17 ENCOUNTER — Other Ambulatory Visit: Payer: Self-pay

## 2020-01-17 DIAGNOSIS — Z1231 Encounter for screening mammogram for malignant neoplasm of breast: Secondary | ICD-10-CM

## 2020-01-18 ENCOUNTER — Ambulatory Visit: Payer: PPO

## 2020-02-29 DIAGNOSIS — H40013 Open angle with borderline findings, low risk, bilateral: Secondary | ICD-10-CM | POA: Diagnosis not present

## 2020-04-18 DIAGNOSIS — E7849 Other hyperlipidemia: Secondary | ICD-10-CM | POA: Diagnosis not present

## 2020-04-18 DIAGNOSIS — M859 Disorder of bone density and structure, unspecified: Secondary | ICD-10-CM | POA: Diagnosis not present

## 2020-04-18 DIAGNOSIS — E038 Other specified hypothyroidism: Secondary | ICD-10-CM | POA: Diagnosis not present

## 2020-04-24 ENCOUNTER — Ambulatory Visit (INDEPENDENT_AMBULATORY_CARE_PROVIDER_SITE_OTHER): Payer: PPO | Admitting: Allergy and Immunology

## 2020-04-24 ENCOUNTER — Other Ambulatory Visit: Payer: Self-pay

## 2020-04-24 ENCOUNTER — Encounter: Payer: Self-pay | Admitting: Allergy and Immunology

## 2020-04-24 VITALS — BP 128/58 | HR 95 | Resp 16 | Ht 62.6 in | Wt 194.2 lb

## 2020-04-24 DIAGNOSIS — J4550 Severe persistent asthma, uncomplicated: Secondary | ICD-10-CM | POA: Diagnosis not present

## 2020-04-24 DIAGNOSIS — K219 Gastro-esophageal reflux disease without esophagitis: Secondary | ICD-10-CM | POA: Diagnosis not present

## 2020-04-24 DIAGNOSIS — J3089 Other allergic rhinitis: Secondary | ICD-10-CM

## 2020-04-24 MED ORDER — DEXILANT 60 MG PO CPDR: DELAYED_RELEASE_CAPSULE | ORAL | 1 refills | Status: DC

## 2020-04-24 MED ORDER — BREZTRI AEROSPHERE 160-9-4.8 MCG/ACT IN AERO: INHALATION_SPRAY | RESPIRATORY_TRACT | 5 refills | Status: DC

## 2020-04-24 NOTE — Patient Instructions (Addendum)
  1.  Treat and prevent inflammation:   A. Breztri - 2 inhalations 2 times per day  B. Flonase - 1 spray each nostril 2 times per day   C. Prednisone 10 mg - 2 tablets 1 time per day for 10 days  2. Obtain blood - CBC w/D, Area 2 aeroallergen profile (BEFORE prednisone)  3. Continue Dexilant 60mg  one tablet daily  4. Continue Claritin if needed  5. Return in 10 days or earlier if problem

## 2020-04-24 NOTE — Progress Notes (Signed)
[place] [facility] [place] [facility] [place]   Follow-up Note  Referring Provider: Crist Infante, MD Primary Provider: Crist Infante, MD Date of Office Visit: 04/24/2020  Subjective:   Allison Clark (DOB: 11/16/1950) is a 69 y.o. female who returns to the [facility] on 04/24/2020 in re-evaluation of the following:  HPI: Allison Clark returns to this clinic in evaluation of asthma and allergic rhinitis and LPR.  Her last visit to this clinic was 03 November 2019.  She has not had a particularly good spring regarding her respiratory tract disease.  She has constant coughing and wheezing and phlegm stuck in her throat and nasal congestion and can't smell that well and overall it has just been a terrible spring.  This occurs even though she has been using her Symbicort on a regular basis and in fact added Arnuity over the course of the past few months and continues on a nasal steroid.  She has not had any chest pain, ugly sputum production, ugly nasal discharge, headaches, or recurrent fever.  She has not really been having any issues with reflux.  She has obtained two [place] vaccinations.  Allergies as of 04/24/2020      Reactions   Avelox [moxifloxacin Hcl In Nacl] Hives   Sulfa Antibiotics Rash      Medication List      Arnuity Ellipta 200 MCG/ACT Aepb Generic drug: Fluticasone Furoate Inhale 2 Doses into the lungs 2 (two) times daily as needed (during asthma flare-up). Rinse, gargle, and spit after use.   budesonide-formoterol 160-4.5 MCG/ACT inhaler Commonly known as: Symbicort Inhale 2 puffs into the lungs 2 (two) times daily. Rinse, gargle, and spit after use.   Dexilant 60 MG capsule Generic drug: dexlansoprazole TAKE ONE CAPSULE BY MOUTH BEFORE BREAKFAST   fluticasone 50 MCG/ACT nasal spray Commonly known as: FLONASE Place 1 spray into both nostrils daily.   levalbuterol 45 MCG/ACT inhaler Commonly known as: XOPENEX HFA INHALE 2  PUFFS BY MOUTH EVERY 4 TO 6 HOURS AS NEEDED FOR COUGH OR WHEEZE   levothyroxine 112 MCG tablet Commonly known as: SYNTHROID Take 112 mcg by mouth daily before breakfast.   loratadine 10 MG tablet Commonly known as: CLARITIN Take 10 mg by mouth daily.   olmesartan 20 MG tablet Commonly known as: BENICAR Take 20 mg by mouth daily.   simvastatin 20 MG tablet Commonly known as: ZOCOR Take 20 mg by mouth at bedtime.       Past Medical History:  Diagnosis Date  . Asthma   . BBB (bundle branch block)    left  . Breast cancer [facility]) 2004   Left Breast Cancer  . GERD (gastroesophageal reflux disease)   . Thyroid disease     Past Surgical History:  Procedure Laterality Date  . BREAST LUMPECTOMY Left 2004   malignant  . BREAST SURGERY    . STERILIZATION    . THYROIDECTOMY    . TONSILLECTOMY    . TUBAL LIGATION      Review of systems negative except as noted in HPI / PMHx or noted below:  Review of Systems  Constitutional: Negative.   HENT: Negative.   Eyes: Negative.   Respiratory: Negative.   Cardiovascular: Negative.   Gastrointestinal: Negative.   Genitourinary: Negative.   Musculoskeletal: Negative.   Skin: Negative.   Neurological: Negative.   Endo/Heme/Allergies: Negative.   Psychiatric/Behavioral: Negative.      Objective:   Vitals:   04/24/20 1538  BP: (!) 128/58  Pulse: 95  Resp: 16  SpO2: 92%   Height: 5' 2.6" (159 cm)  Weight: 194 lb 3.2 oz (88.1 kg)   Physical Exam Constitutional:      Appearance: She is not diaphoretic.  HENT:     Head: Normocephalic.     Right Ear: Tympanic membrane, ear canal and external ear normal.     Left Ear: Tympanic membrane, ear canal and external ear normal.     Nose: Nose normal. No mucosal edema or rhinorrhea.     Mouth/Throat:     Pharynx: Uvula midline. No oropharyngeal exudate.  Eyes:     Conjunctiva/sclera: Conjunctivae normal.  Neck:     Thyroid: No thyromegaly.     Trachea: Trachea normal. No  tracheal tenderness or tracheal deviation.  Cardiovascular:     Rate and Rhythm: Normal rate and regular rhythm.     Heart sounds: Normal heart sounds, S1 normal and S2 normal. No murmur heard.   Pulmonary:     Effort: No respiratory distress.     Breath sounds: Normal breath sounds. No stridor. No wheezing (Bilateral expiratory and inspiratory wheezes all lung fields) or rales.  Lymphadenopathy:     Head:     Right side of head: No tonsillar adenopathy.     Left side of head: No tonsillar adenopathy.     Cervical: No cervical adenopathy.  Skin:    Findings: No erythema or rash.     Nails: There is no clubbing.  Neurological:     Mental Status: She is alert.     Diagnostics:    Spirometry was performed and demonstrated an FEV1 of 0.95 at 43 % of predicted.  The patient had an Asthma Control Test with the following results: ACT Total Score: 20.    Assessment and Plan:   1. Not well controlled severe persistent asthma   2. Other allergic rhinitis   3. Laryngopharyngeal reflux (LPR)     1.  Treat and prevent inflammation:   A. Breztri - 2 inhalations 2 times per day  B. Flonase - 1 spray each nostril 2 times per day   C. Prednisone 10 mg - 2 tablets 1 time per day for 10 days  2. Obtain blood - CBC w/D, Area 2 aeroallergen profile (BEFORE prednisone)  3. Continue Dexilant 60mg  one tablet daily  4. Continue Claritin if needed  5. Return in 10 days or earlier if problem  Mahagony appears to have lots of inflammation in her airway and it sounds as though this was a particularly bad spring and she has never resolved this issue since that point in time.  I am going to have her utilize systemic steroids as an anti-inflammatory agent for her airway inflammation while she uses a triple inhaler and nasal steroid on a consistent basis.  Her reflux appears to be under good control and can she can stay on the dexilant.  I am going to check her blood to see if she would be a candidate  for a biologic agent.  [name], MD Allergy / Immunology [place]

## 2020-04-25 ENCOUNTER — Encounter: Payer: Self-pay | Admitting: Allergy and Immunology

## 2020-04-25 DIAGNOSIS — J45909 Unspecified asthma, uncomplicated: Secondary | ICD-10-CM | POA: Diagnosis not present

## 2020-04-25 DIAGNOSIS — E785 Hyperlipidemia, unspecified: Secondary | ICD-10-CM | POA: Insufficient documentation

## 2020-04-25 DIAGNOSIS — R82998 Other abnormal findings in urine: Secondary | ICD-10-CM | POA: Diagnosis not present

## 2020-04-25 DIAGNOSIS — K5730 Diverticulosis of large intestine without perforation or abscess without bleeding: Secondary | ICD-10-CM

## 2020-04-25 DIAGNOSIS — Z0000 Encounter for general adult medical examination without abnormal findings: Secondary | ICD-10-CM | POA: Diagnosis not present

## 2020-04-25 DIAGNOSIS — R7301 Impaired fasting glucose: Secondary | ICD-10-CM

## 2020-04-25 DIAGNOSIS — I10 Essential (primary) hypertension: Secondary | ICD-10-CM | POA: Diagnosis not present

## 2020-04-25 DIAGNOSIS — K219 Gastro-esophageal reflux disease without esophagitis: Secondary | ICD-10-CM | POA: Diagnosis not present

## 2020-04-25 DIAGNOSIS — E039 Hypothyroidism, unspecified: Secondary | ICD-10-CM | POA: Diagnosis not present

## 2020-04-25 DIAGNOSIS — Z1339 Encounter for screening examination for other mental health and behavioral disorders: Secondary | ICD-10-CM | POA: Diagnosis not present

## 2020-04-25 DIAGNOSIS — I493 Ventricular premature depolarization: Secondary | ICD-10-CM | POA: Diagnosis not present

## 2020-04-25 DIAGNOSIS — M1990 Unspecified osteoarthritis, unspecified site: Secondary | ICD-10-CM | POA: Diagnosis not present

## 2020-04-25 DIAGNOSIS — E669 Obesity, unspecified: Secondary | ICD-10-CM | POA: Diagnosis not present

## 2020-04-25 DIAGNOSIS — Z1331 Encounter for screening for depression: Secondary | ICD-10-CM | POA: Diagnosis not present

## 2020-04-25 DIAGNOSIS — J302 Other seasonal allergic rhinitis: Secondary | ICD-10-CM | POA: Diagnosis not present

## 2020-04-25 DIAGNOSIS — M8580 Other specified disorders of bone density and structure, unspecified site: Secondary | ICD-10-CM | POA: Diagnosis not present

## 2020-04-25 HISTORY — DX: Impaired fasting glucose: R73.01

## 2020-04-25 HISTORY — DX: Hyperlipidemia, unspecified: E78.5

## 2020-04-25 HISTORY — DX: Diverticulosis of large intestine without perforation or abscess without bleeding: K57.30

## 2020-04-27 ENCOUNTER — Other Ambulatory Visit: Payer: Self-pay | Admitting: Internal Medicine

## 2020-04-27 DIAGNOSIS — Z1212 Encounter for screening for malignant neoplasm of rectum: Secondary | ICD-10-CM | POA: Diagnosis not present

## 2020-04-27 DIAGNOSIS — R809 Proteinuria, unspecified: Secondary | ICD-10-CM

## 2020-04-27 DIAGNOSIS — E785 Hyperlipidemia, unspecified: Secondary | ICD-10-CM

## 2020-04-27 HISTORY — DX: Proteinuria, unspecified: R80.9

## 2020-04-27 LAB — CBC WITH DIFFERENTIAL/PLATELET
Basophils Absolute: 0.1 10*3/uL (ref 0.0–0.2)
Basos: 1 %
EOS (ABSOLUTE): 0.3 10*3/uL (ref 0.0–0.4)
Eos: 3 %
Hematocrit: 44.4 % (ref 34.0–46.6)
Hemoglobin: 15 g/dL (ref 11.1–15.9)
Immature Grans (Abs): 0.1 10*3/uL (ref 0.0–0.1)
Immature Granulocytes: 1 %
Lymphocytes Absolute: 3.3 10*3/uL — ABNORMAL HIGH (ref 0.7–3.1)
Lymphs: 26 %
MCH: 29.3 pg (ref 26.6–33.0)
MCHC: 33.8 g/dL (ref 31.5–35.7)
MCV: 87 fL (ref 79–97)
Monocytes Absolute: 0.9 10*3/uL (ref 0.1–0.9)
Monocytes: 7 %
Neutrophils Absolute: 7.9 10*3/uL — ABNORMAL HIGH (ref 1.4–7.0)
Neutrophils: 62 %
Platelets: 284 10*3/uL (ref 150–450)
RBC: 5.12 x10E6/uL (ref 3.77–5.28)
RDW: 13.1 % (ref 11.7–15.4)
WBC: 12.4 10*3/uL — ABNORMAL HIGH (ref 3.4–10.8)

## 2020-04-27 LAB — ALLERGENS W/TOTAL IGE AREA 2
Alternaria Alternata IgE: <0.1 kU/L
Aspergillus Fumigatus IgE: <0.1 kU/L
Bermuda Grass IgE: <0.1 kU/L
Cat Dander IgE: 27.1 kU/L — AB
Cedar, Mountain IgE: <0.1 kU/L
Cladosporium Herbarum IgE: <0.1 kU/L
Cockroach, German IgE: <0.1 kU/L
Common Silver Birch IgE: <0.1 kU/L
Cottonwood IgE: <0.1 kU/L
D Farinae IgE: <0.1 kU/L
D Pteronyssinus IgE: <0.1 kU/L
Dog Dander IgE: 2.43 kU/L — AB
Elm, American IgE: <0.1 kU/L
IgE (Immunoglobulin E), Serum: 312 IU/mL (ref 6–495)
Johnson Grass IgE: <0.1 kU/L
Maple/Box Elder IgE: <0.1 kU/L
Mouse Urine IgE: <0.1 kU/L
Oak, White IgE: <0.1 kU/L
Pecan, Hickory IgE: <0.1 kU/L
Penicillium Chrysogen IgE: <0.1 kU/L
Pigweed, Rough IgE: <0.1 kU/L
Ragweed, Short IgE: <0.1 kU/L
Sheep Sorrel IgE Qn: <0.1 kU/L
Timothy Grass IgE: <0.1 kU/L
White Mulberry IgE: <0.1 kU/L

## 2020-05-03 ENCOUNTER — Other Ambulatory Visit: Payer: Self-pay

## 2020-05-03 ENCOUNTER — Encounter: Payer: Self-pay | Admitting: Allergy and Immunology

## 2020-05-03 ENCOUNTER — Ambulatory Visit (INDEPENDENT_AMBULATORY_CARE_PROVIDER_SITE_OTHER): Payer: PPO | Admitting: Allergy and Immunology

## 2020-05-03 VITALS — BP 118/68 | HR 84 | Resp 16

## 2020-05-03 DIAGNOSIS — J4550 Severe persistent asthma, uncomplicated: Secondary | ICD-10-CM | POA: Diagnosis not present

## 2020-05-03 DIAGNOSIS — K219 Gastro-esophageal reflux disease without esophagitis: Secondary | ICD-10-CM

## 2020-05-03 DIAGNOSIS — J3089 Other allergic rhinitis: Secondary | ICD-10-CM

## 2020-05-03 DIAGNOSIS — D7219 Other eosinophilia: Secondary | ICD-10-CM | POA: Diagnosis not present

## 2020-05-03 MED ORDER — EPINEPHRINE 0.3 MG/0.3ML IJ SOAJ
INTRAMUSCULAR | 3 refills | Status: DC
Start: 2020-05-03 — End: 2023-05-07

## 2020-05-03 NOTE — Patient Instructions (Addendum)
  1.  Treat and prevent inflammation:   A. Breztri - 2 inhalations 2 times per day  B. Flonase - 1 spray each nostril 2 times per day   C. Benralizumab autoinjector today and every 4 weeks  2. Continue Dexilant 60mg  one tablet daily  3. Continue Claritin and levalbuterol HFA if needed  4. Return in 12 weeks or earlier if problem  5. Obtain fall flu vaccine

## 2020-05-03 NOTE — Progress Notes (Signed)
[name] started Allison Clark today. I reviewed all information with her and demonstrated how to use the autoinjector. She administered her [name] herself. Consent was signed and Epipen rx was sent to her pharmacy.

## 2020-05-03 NOTE — Progress Notes (Signed)
[place] [facility] [place] [facility] [place]   Follow-up Note  Referring Provider: Crist Infante, MD Primary Provider: Crist Infante, MD Date of Office Visit: 05/03/2020  Subjective:   Allison Clark (DOB: 1951-06-11) is a 69 y.o. female who returns to the [facility] on 05/03/2020 in re-evaluation of the following:  HPI: Marjo presents to this clinic in reevaluation of asthma and allergic rhinitis and LPR.  Her last visit to this clinic was 24 April 2020.  During her last visit she had a rather significant exacerbation of her asthma with an FEV1 of approximately 45% though it had been an issue that was ongoing for several months.  In addition she had a fair amount of upper airway congestion and phlegm stuck in her throat and inability to smell.  We treated her with systemic steroids and a combination of various anti-inflammatory agents for both her upper and lower airway.  She is much better at this point in time.  She does not have any cough or wheezing.  She can smell better and does not have as much congestion although she still has the sensation of some phlegm in her mid airway.  She believes that her reflux is under very good control on her current plan of proton pump inhibitor use.  Allergies as of 05/03/2020      Reactions   Avelox [moxifloxacin Hcl In Nacl] Hives   Sulfa Antibiotics Rash      Medication List      Breztri Aerosphere 160-9-4.8 MCG/ACT Aero Generic drug: Budeson-Glycopyrrol-Formoterol Inhale two puffs twice daily to prevent cough or wheeze.  Rinse, gargle, and spit after use.   Dexilant 60 MG capsule Generic drug: dexlansoprazole TAKE ONE CAPSULE BY MOUTH BEFORE BREAKFAST   fluticasone 50 MCG/ACT nasal spray Commonly known as: FLONASE Place 1 spray into both nostrils daily.   levalbuterol 45 MCG/ACT inhaler Commonly known as: XOPENEX HFA INHALE 2 PUFFS BY MOUTH EVERY 4 TO 6 HOURS AS NEEDED FOR COUGH OR WHEEZE     levothyroxine 112 MCG tablet Commonly known as: SYNTHROID Take 112 mcg by mouth daily before breakfast.   loratadine 10 MG tablet Commonly known as: CLARITIN Take 10 mg by mouth daily.   olmesartan 20 MG tablet Commonly known as: BENICAR Take 20 mg by mouth daily.   simvastatin 20 MG tablet Commonly known as: ZOCOR Take 20 mg by mouth at bedtime.   VITAMIN D3 PO Take by mouth.       Past Medical History:  Diagnosis Date  . Asthma   . BBB (bundle branch block)    left  . Breast cancer [facility]) 2004   Left Breast Cancer  . GERD (gastroesophageal reflux disease)   . Thyroid disease     Past Surgical History:  Procedure Laterality Date  . BREAST LUMPECTOMY Left 2004   malignant  . BREAST SURGERY    . STERILIZATION    . THYROIDECTOMY    . TONSILLECTOMY    . TUBAL LIGATION      Review of systems negative except as noted in HPI / PMHx or noted below:  Review of Systems  Constitutional: Negative.   HENT: Negative.   Eyes: Negative.   Respiratory: Negative.   Cardiovascular: Negative.   Gastrointestinal: Negative.   Genitourinary: Negative.   Musculoskeletal: Negative.   Skin: Negative.   Neurological: Negative.   Endo/Heme/Allergies: Negative.   Psychiatric/Behavioral: Negative.      Objective:   Vitals:   05/03/20  1541  BP: 118/68  Pulse: 84  Resp: 16  SpO2: 94%          Physical Exam Constitutional:      Appearance: She is not diaphoretic.  HENT:     Head: Normocephalic.     Right Ear: Tympanic membrane, ear canal and external ear normal.     Left Ear: Tympanic membrane, ear canal and external ear normal.     Nose: Nose normal. No mucosal edema or rhinorrhea.     Mouth/Throat:     Pharynx: Uvula midline. No oropharyngeal exudate.  Eyes:     Conjunctiva/sclera: Conjunctivae normal.  Neck:     Thyroid: No thyromegaly.     Trachea: Trachea normal. No tracheal tenderness or tracheal deviation.  Cardiovascular:     Rate and Rhythm: Normal  rate and regular rhythm.     Heart sounds: Normal heart sounds, S1 normal and S2 normal. No murmur heard.   Pulmonary:     Effort: No respiratory distress.     Breath sounds: Normal breath sounds. No stridor. No wheezing or rales.  Lymphadenopathy:     Head:     Right side of head: No tonsillar adenopathy.     Left side of head: No tonsillar adenopathy.     Cervical: No cervical adenopathy.  Skin:    Findings: No erythema or rash.     Nails: There is no clubbing.  Neurological:     Mental Status: She is alert.     Diagnostics:    Spirometry was performed and demonstrated an FEV1 of 1.65 at 75 % of predicted.  Results of blood tests obtained 24 April 2020 identified WBC 12.4, absolute eosinophil 300, absolute lymphocyte 3300, hemoglobin 15.0, platelet 284, serum IgE 312 KU/L, cat IgE 27.0 KU/L, dog IgE 2.43 KU/L  Assessment and Plan:   1. Not well controlled severe persistent asthma   2. Other allergic rhinitis   3. Laryngopharyngeal reflux (LPR)   4. Severe persistent asthma without complication   5. Other eosinophilia     1.  Treat and prevent inflammation:   A. Breztri - 2 inhalations 2 times per day  B. Flonase - 1 spray each nostril 2 times per day   C. Benralizumab autoinjector today and every 4 weeks  2. Continue Dexilant 60mg  one tablet daily  3. Continue Claritin and levalbuterol HFA if needed  4. Return in 12 weeks or earlier if problem  5. Obtain fall flu vaccine  Shatira will utilize an anti-IL-5 biologic agent for her eosinophilic driven atopic airway disease which I suspect will result in rather significant improvement for both her upper and lower airway issue and probably an elimination for the need to use other medications directed against airway inflammation.  She will continue to treat reflux as noted above.  I will see her back in this clinic in 12 weeks or earlier if there is a problem.  [name], MD Allergy / Immunology [place]

## 2020-05-04 ENCOUNTER — Encounter: Payer: Self-pay | Admitting: Allergy and Immunology

## 2020-05-31 ENCOUNTER — Telehealth: Payer: Self-pay | Admitting: *Deleted

## 2020-05-31 NOTE — Telephone Encounter (Signed)
Patient informed. 

## 2020-05-31 NOTE — Telephone Encounter (Signed)
Lou calls stating that she is experiencing hoarseness.  She notices that it is worse in the mornings, gets a little better during the day but worsens in the afternoon/evening. She has no sore throat or nasal congestion only minor mucus in her throat but that is much better than before. Her breathing is much better.  She is using her flonase twice daily and she started the [place] last month. The first few weeks after starting the [facility] she had no issues. She is using her spacer and she does rinse, gargle, and spit after each use. Please advise.

## 2020-05-31 NOTE — Telephone Encounter (Signed)
Please inform Caleb that now that she is using benralizumab is a controller biologic agent we can decrease her triple inhaler.  Have her decrease Breztri to just 1 time per day administration.  This should take care of her throat issue within a week or 2 and if not contact us for further evaluation.

## 2020-06-13 ENCOUNTER — Ambulatory Visit
Admission: RE | Admit: 2020-06-13 | Discharge: 2020-06-13 | Disposition: A | Discharge status: Home / Self Care | Payer: No Typology Code available for payment source | Source: Ambulatory Visit | Attending: Internal Medicine | Admitting: Internal Medicine

## 2020-06-13 DIAGNOSIS — E785 Hyperlipidemia, unspecified: Secondary | ICD-10-CM

## 2020-07-22 DIAGNOSIS — Z23 Encounter for immunization: Secondary | ICD-10-CM | POA: Diagnosis not present

## 2020-07-26 ENCOUNTER — Encounter: Payer: Self-pay | Admitting: Family Medicine

## 2020-07-26 ENCOUNTER — Ambulatory Visit (INDEPENDENT_AMBULATORY_CARE_PROVIDER_SITE_OTHER): Payer: PPO | Admitting: Family Medicine

## 2020-07-26 ENCOUNTER — Other Ambulatory Visit: Payer: Self-pay

## 2020-07-26 VITALS — BP 112/60 | HR 96 | Resp 16

## 2020-07-26 DIAGNOSIS — J3089 Other allergic rhinitis: Secondary | ICD-10-CM | POA: Diagnosis not present

## 2020-07-26 DIAGNOSIS — J4550 Severe persistent asthma, uncomplicated: Secondary | ICD-10-CM | POA: Insufficient documentation

## 2020-07-26 DIAGNOSIS — K219 Gastro-esophageal reflux disease without esophagitis: Secondary | ICD-10-CM | POA: Diagnosis not present

## 2020-07-26 HISTORY — DX: Severe persistent asthma, uncomplicated: J45.50

## 2020-07-26 NOTE — Patient Instructions (Signed)
Asthma Continue Breztri-2 puffs once a day with a spacer to prevent cough or wheeze Continue Xopenex once every 4-6 hours as needed Continue with Fasenra injections once every 8 weeks and have access to an epinephrine auto-injector set  Allergic rhinitis Continue Flonase 2 sprays in each nostril once a day as needed for a stuffy nose.  In the right nostril, point the applicator out toward the right ear. In the left nostril, point the applicator out toward the left ear Continue Claritin 10 mg once a day as needed for a runny nose  Reflux Continue dietary and lifestyle modifications as listed below Continue Dexilant 60 mg once a day to control reflux  Call the clinic if this treatment plan is not working well for you  Follow up in 6 months or sooner if needed.

## 2020-07-26 NOTE — Progress Notes (Signed)
[address] [place] [address] Dept: [phone]  FOLLOW UP NOTE  Patient ID: Allison Clark, female    DOB: December 31, 1950  Age: 69 y.o. MRN: [id number] Date of Office Visit: 07/26/2020  Assessment  Chief Complaint: Asthma and Allergic Rhinitis   HPI REMELL GIAIMO is a 69 year old female who presents the clinic for follow-up visit.  She was last seen in this clinic on 05/03/2020 by Dr. Neldon Mc for evaluation of asthma, allergic rhinitis, and laryngeal pharyngeal reflux.  At that time she began Fasenra injections.  At today's visit, she reports her asthma has been well controlled with no shortness of breath, cough, or wheeze with activity or rest.  She continues breast tree 2 puffs once a day with a spacer and has used her Xopenex 4 times since July with relief of symptoms.  She reports Fasenra injections as going well with no pain or redness at the injection site.  She reports a significant decrease in her symptoms of asthma while continuing on Fasenra injections.  Allergic rhinitis is reported as moderately well controlled with no nasal congestion, rhinorrhea, or sneezing.  She does report frequent throat clearing which she has associated with weather changes.  She continues loratadine 10 mg once a day and Flonase once a day.  She is not currently using a nasal saline rinse.  She reports poor technique with Flonase application.  Reflux is reported as well controlled with no symptoms including heartburn or vomiting.  She continues Dexilant 60 mg once a day.  Her current medications are listed in the chart.   Drug Allergies:  Allergies  Allergen Reactions  . Avelox [Moxifloxacin Hcl In Nacl] Hives  . Sulfa Antibiotics Rash    Physical Exam: BP 112/60   Pulse 96   Resp 16   SpO2 93%    Physical Exam Vitals reviewed.  Constitutional:      Appearance: Normal appearance.  HENT:     Head: Normocephalic and atraumatic.     Right Ear: Tympanic membrane normal.     Left Ear: Tympanic  membrane normal.     Nose:     Comments: Bilateral nares slightly erythematous with clear nasal drainage.  Pharynx slightly erythematous with no exudate.  Ears normal.  Eyes normal. Eyes:     Conjunctiva/sclera: Conjunctivae normal.  Cardiovascular:     Rate and Rhythm: Normal rate and regular rhythm.     Heart sounds: Normal heart sounds. No murmur heard.   Pulmonary:     Effort: Pulmonary effort is normal.     Breath sounds: Normal breath sounds.     Comments: Lungs clear to auscultation Musculoskeletal:        General: Normal range of motion.     Cervical back: Normal range of motion and neck supple.  Skin:    General: Skin is warm and dry.  Neurological:     Mental Status: She is alert and oriented to person, place, and time.  Psychiatric:        Mood and Affect: Mood normal.        Behavior: Behavior normal.        Thought Content: Thought content normal.        Judgment: Judgment normal.     Diagnostics: FVC 1.77, FEV1 1.47. Predicted FVC 2.91, predicted FEV1 2.20. Spirometry indicates moderate restriction. This is consistent with previous spirometry readings.   Assessment and Plan: 1. Severe persistent asthma without complication   2. Other allergic rhinitis   3. Laryngopharyngeal  reflux (LPR)     Patient Instructions  Asthma Continue Breztri-2 puffs once a day with a spacer to prevent cough or wheeze Continue Xopenex once every 4-6 hours as needed Continue with Fasenra injections once every 8 weeks and have access to an epinephrine auto-injector set  Allergic rhinitis Continue Flonase 2 sprays in each nostril once a day as needed for a stuffy nose.  In the right nostril, point the applicator out toward the right ear. In the left nostril, point the applicator out toward the left ear Continue Claritin 10 mg once a day as needed for a runny nose  Reflux Continue dietary and lifestyle modifications as listed below Continue Dexilant 60 mg once a day to control  reflux  Call the clinic if this treatment plan is not working well for you  Follow up in 6 months or sooner if needed.   Return in about 6 months (around 01/24/2021), or if symptoms worsen or fail to improve.    Thank you for the opportunity to care for this patient.  Please do not hesitate to contact me with questions.  Gareth Morgan, FNP [facility]

## 2020-08-22 DIAGNOSIS — L738 Other specified follicular disorders: Secondary | ICD-10-CM | POA: Diagnosis not present

## 2020-08-22 DIAGNOSIS — L918 Other hypertrophic disorders of the skin: Secondary | ICD-10-CM | POA: Diagnosis not present

## 2020-10-18 ENCOUNTER — Other Ambulatory Visit: Payer: Self-pay

## 2020-10-18 MED ORDER — DEXILANT 60 MG PO CPDR: DELAYED_RELEASE_CAPSULE | ORAL | 1 refills | Status: DC

## 2020-10-24 DIAGNOSIS — M8589 Other specified disorders of bone density and structure, multiple sites: Secondary | ICD-10-CM | POA: Diagnosis not present

## 2020-10-24 DIAGNOSIS — M25552 Pain in left hip: Secondary | ICD-10-CM | POA: Diagnosis not present

## 2020-10-24 DIAGNOSIS — Z853 Personal history of malignant neoplasm of breast: Secondary | ICD-10-CM | POA: Diagnosis not present

## 2020-10-24 DIAGNOSIS — M79602 Pain in left arm: Secondary | ICD-10-CM | POA: Diagnosis not present

## 2020-11-16 DIAGNOSIS — E785 Hyperlipidemia, unspecified: Secondary | ICD-10-CM | POA: Diagnosis not present

## 2020-11-16 DIAGNOSIS — K219 Gastro-esophageal reflux disease without esophagitis: Secondary | ICD-10-CM | POA: Diagnosis not present

## 2020-11-16 DIAGNOSIS — I10 Essential (primary) hypertension: Secondary | ICD-10-CM | POA: Diagnosis not present

## 2020-11-16 DIAGNOSIS — G720 Drug-induced myopathy: Secondary | ICD-10-CM | POA: Diagnosis not present

## 2020-11-16 DIAGNOSIS — M8580 Other specified disorders of bone density and structure, unspecified site: Secondary | ICD-10-CM | POA: Diagnosis not present

## 2020-12-01 ENCOUNTER — Encounter: Payer: Self-pay | Admitting: Allergy and Immunology

## 2020-12-01 ENCOUNTER — Ambulatory Visit: Payer: PPO | Admitting: Allergy and Immunology

## 2020-12-01 ENCOUNTER — Other Ambulatory Visit: Payer: Self-pay

## 2020-12-01 VITALS — BP 130/66 | HR 89 | Temp 97.2°F | Resp 18

## 2020-12-01 DIAGNOSIS — J3089 Other allergic rhinitis: Secondary | ICD-10-CM

## 2020-12-01 DIAGNOSIS — U071 COVID-19: Secondary | ICD-10-CM

## 2020-12-01 DIAGNOSIS — K219 Gastro-esophageal reflux disease without esophagitis: Secondary | ICD-10-CM

## 2020-12-01 DIAGNOSIS — J4550 Severe persistent asthma, uncomplicated: Secondary | ICD-10-CM | POA: Diagnosis not present

## 2020-12-01 NOTE — Progress Notes (Signed)
[place] [facility] [place] [facility] [place]   Follow-up Note  Referring Provider: Crist Infante, MD Primary Provider: Crist Infante, MD Date of Office Visit: 12/01/2020  Subjective:   Allison Clark (DOB: 03/30/1951) is a 70 y.o. female who returns to the [facility] on 12/01/2020 in re-evaluation of the following:  HPI: Allison Clark returns to this clinic in reevaluation of asthma and allergic rhinitis and LPR.  Her last visit to this clinic was 26 July 2020 with our nurse practitioner.  I last saw her in this clinic on 03 May 2020.  During the middle of last year we started her on benralizumab injections and this has changed her phenotype significantly.  She really has no significant respiratory tract symptoms to speak of at all.  She rarely uses a short acting bronchodilator.  She has tapered down her triple inhaler to just 1 time per day.  She has had very little issues with her upper airway.  She has not required a systemic steroid or an antibiotic for any type of airway issue.  Reflux has been under excellent control.  But, 3 days ago, she developed a cough and feeling as though there was dust in her throat and then burning in her throat and subsequently developed very significant nasal congestion and sniffing and snorting and clear rhinorrhea and her face hurts a little bit.  She has already had 3 COVID vaccines and the flu vaccine.  Allergies as of 12/01/2020      Reactions   Avelox [moxifloxacin Hcl In Nacl] Hives   Sulfa Antibiotics Rash      Medication List      Breztri Aerosphere 160-9-4.8 MCG/ACT Aero Generic drug: Budeson-Glycopyrrol-Formoterol Inhale two puffs twice daily to prevent cough or wheeze.  Rinse, gargle, and spit after use.   Dexilant 60 MG capsule Generic drug: dexlansoprazole TAKE ONE CAPSULE BY MOUTH BEFORE BREAKFAST   EPINEPHrine 0.3 mg/0.3 mL Soaj injection Commonly known as: EPI-PEN Use as directed for life  threatening allergic reactions   ezetimibe 10 MG tablet Commonly known as: ZETIA Take 10 mg by mouth daily.   Fasenra 30 MG/ML Sosy Generic drug: Benralizumab Inject 30 mg into the skin every 8 (eight) weeks.   fluticasone 50 MCG/ACT nasal spray Commonly known as: FLONASE Place 1 spray into both nostrils daily.   ibandronate 150 MG tablet Commonly known as: BONIVA Take 150 mg by mouth every 30 (thirty) days.   levalbuterol 45 MCG/ACT inhaler Commonly known as: XOPENEX HFA INHALE 2 PUFFS BY MOUTH EVERY 4 TO 6 HOURS AS NEEDED FOR COUGH OR WHEEZE   levothyroxine 112 MCG tablet Commonly known as: SYNTHROID Take 112 mcg by mouth daily before breakfast.   loratadine 10 MG tablet Commonly known as: CLARITIN Take 10 mg by mouth daily.   olmesartan 20 MG tablet Commonly known as: BENICAR Take 20 mg by mouth daily.   simvastatin 20 MG tablet Commonly known as: ZOCOR Take 20 mg by mouth at bedtime.       Past Medical History:  Diagnosis Date  . Asthma   . BBB (bundle branch block)    left  . Breast cancer [facility]) 2004   Left Breast Cancer  . GERD (gastroesophageal reflux disease)   . Thyroid disease     Past Surgical History:  Procedure Laterality Date  . BREAST LUMPECTOMY Left 2004   malignant  . BREAST SURGERY    . STERILIZATION    . THYROIDECTOMY    .  TONSILLECTOMY    . TUBAL LIGATION      Review of systems negative except as noted in HPI / PMHx or noted below:  Review of Systems  Constitutional: Negative.   HENT: Negative.   Eyes: Negative.   Respiratory: Negative.   Cardiovascular: Negative.   Gastrointestinal: Negative.   Genitourinary: Negative.   Musculoskeletal: Negative.   Skin: Negative.   Neurological: Negative.   Endo/Heme/Allergies: Negative.   Psychiatric/Behavioral: Negative.      Objective:   Vitals:   12/01/20 1055  BP: 130/66  Pulse: 89  Resp: 18  Temp: (!) 97.2 F (36.2 C)  SpO2: 95%          Physical  Exam Constitutional:      Appearance: She is not diaphoretic.  HENT:     Head: Normocephalic.     Right Ear: Tympanic membrane, ear canal and external ear normal.     Left Ear: Tympanic membrane, ear canal and external ear normal.     Nose: Mucosal edema (Erythematous) present. No rhinorrhea.     Mouth/Throat:     Mouth: Mucous membranes are normal.     Pharynx: Uvula midline. Posterior oropharyngeal erythema present. No oropharyngeal exudate.  Eyes:     Conjunctiva/sclera: Conjunctivae normal.  Neck:     Thyroid: No thyromegaly.     Trachea: Trachea normal. No tracheal tenderness or tracheal deviation.  Cardiovascular:     Rate and Rhythm: Normal rate and regular rhythm.     Heart sounds: Normal heart sounds, S1 normal and S2 normal. No murmur heard.   Pulmonary:     Effort: No respiratory distress.     Breath sounds: Normal breath sounds. No stridor. No wheezing or rales.  Musculoskeletal:        General: No edema.  Lymphadenopathy:     Head:     Right side of head: No tonsillar adenopathy.     Left side of head: No tonsillar adenopathy.     Cervical: No cervical adenopathy.  Skin:    Findings: No erythema or rash.     Nails: There is no clubbing.  Neurological:     Mental Status: She is alert.     Diagnostics:    Spirometry was performed and demonstrated an FEV1 of 1.87 at 86 % of predicted.  [facility] Covid swab positive  Assessment and Plan:   1. COVID-19 virus infection   2. Asthma, severe persistent, well-controlled   3. Other allergic rhinitis   4. Laryngopharyngeal reflux (LPR)     1.  Treat and prevent inflammation:   A. Breztri - 2 inhalations 1-2 times per day (discontinue????)  B. Flonase - 1 spray each nostril 1-2 times per day   C. Benralizumab autoinjector every 4 weeks  2. Continue Dexilant 60mg  one tablet daily  3. Continue Claritin and levalbuterol HFA if needed  4. For Covid infection:   A. 10 day isolation from onset  B.  Nasal saline  C. Ibuprofen   5. Return in 6 months or earlier if problem  Allison Clark is infected with Covid and she has a relatively mild upper respiratory tract infection at this point in time.  She has received 3 Covid immunizations and she should do fine with this infection and not require any Covid specific therapy at this point.  Above and beyond this issue she has really responded quite well to the administration of benralizumab injections and when she gets over this Covid infection I think there is an  opportunity for her to dramatically consolidate her medical treatment.  She is currently only using a triple inhaler 1 time per day and I suspect that she will be able to step off this inhaler as she moves forward.  If she does well with this plan I will see her back in this clinic in 6 months or earlier if there is a problem.  [name], MD Allergy / Immunology [place]

## 2020-12-01 NOTE — Patient Instructions (Addendum)
  1.  Treat and prevent inflammation:   A. Breztri - 2 inhalations 1-2 times per day (discontinue????)  B. Flonase - 1 spray each nostril 1-2 times per day   C. Benralizumab autoinjector every 4 weeks  2. Continue Dexilant 60mg  one tablet daily  3. Continue Claritin and levalbuterol HFA if needed  4. For Covid infection:   A. 10 day isolation from onset  B. Nasal saline  C. Ibuprofen   5. Return in 6 months or earlier if problem

## 2020-12-04 ENCOUNTER — Encounter: Payer: Self-pay | Admitting: Allergy and Immunology

## 2020-12-13 ENCOUNTER — Other Ambulatory Visit: Payer: Self-pay | Admitting: Obstetrics and Gynecology

## 2020-12-13 DIAGNOSIS — Z1231 Encounter for screening mammogram for malignant neoplasm of breast: Secondary | ICD-10-CM

## 2021-01-22 ENCOUNTER — Ambulatory Visit: Payer: PPO | Admitting: Allergy and Immunology

## 2021-02-05 ENCOUNTER — Ambulatory Visit: Payer: PPO

## 2021-02-22 DIAGNOSIS — M1711 Unilateral primary osteoarthritis, right knee: Secondary | ICD-10-CM | POA: Diagnosis not present

## 2021-02-22 DIAGNOSIS — M25562 Pain in left knee: Secondary | ICD-10-CM | POA: Diagnosis not present

## 2021-03-27 ENCOUNTER — Other Ambulatory Visit: Payer: Self-pay

## 2021-03-27 ENCOUNTER — Ambulatory Visit
Admission: RE | Admit: 2021-03-27 | Discharge: 2021-03-27 | Disposition: A | Discharge status: Home / Self Care | Payer: PPO | Source: Ambulatory Visit | Attending: Obstetrics and Gynecology | Admitting: Obstetrics and Gynecology

## 2021-03-27 DIAGNOSIS — Z1231 Encounter for screening mammogram for malignant neoplasm of breast: Secondary | ICD-10-CM | POA: Diagnosis not present

## 2021-04-09 DIAGNOSIS — Z124 Encounter for screening for malignant neoplasm of cervix: Secondary | ICD-10-CM | POA: Diagnosis not present

## 2021-04-09 DIAGNOSIS — Z6832 Body mass index (BMI) 32.0-32.9, adult: Secondary | ICD-10-CM | POA: Diagnosis not present

## 2021-04-09 DIAGNOSIS — M1990 Unspecified osteoarthritis, unspecified site: Secondary | ICD-10-CM

## 2021-04-09 HISTORY — DX: Unspecified osteoarthritis, unspecified site: M19.90

## 2021-04-10 DIAGNOSIS — Z124 Encounter for screening for malignant neoplasm of cervix: Secondary | ICD-10-CM | POA: Diagnosis not present

## 2021-04-18 ENCOUNTER — Other Ambulatory Visit: Payer: Self-pay | Admitting: Allergy and Immunology

## 2021-05-02 DIAGNOSIS — H40013 Open angle with borderline findings, low risk, bilateral: Secondary | ICD-10-CM | POA: Diagnosis not present

## 2021-05-21 ENCOUNTER — Ambulatory Visit: Payer: PPO | Admitting: Allergy and Immunology

## 2021-05-21 ENCOUNTER — Other Ambulatory Visit: Payer: Self-pay

## 2021-05-21 VITALS — BP 136/82 | HR 92 | Resp 16 | Wt 189.6 lb

## 2021-05-21 DIAGNOSIS — J3089 Other allergic rhinitis: Secondary | ICD-10-CM | POA: Diagnosis not present

## 2021-05-21 DIAGNOSIS — K219 Gastro-esophageal reflux disease without esophagitis: Secondary | ICD-10-CM

## 2021-05-21 DIAGNOSIS — J4550 Severe persistent asthma, uncomplicated: Secondary | ICD-10-CM | POA: Diagnosis not present

## 2021-05-21 NOTE — Patient Instructions (Addendum)
  1.  Treat and prevent inflammation:   A. Benralizumab autoinjector every 8 weeks  2. Continue Dexilant '60mg'$  one tablet daily  3. Can restart Breztri - 2 inhalations 1-2 times per day during periods of lower airway symptoms  4. Can restart Flonase - 1 spray each nostril 1-2 times per day during periods of upper airway symptoms  5. Continue Claritin and levalbuterol HFA if needed  6. Return in 6 months or earlier if problem  7. Obtain fall flu vaccine

## 2021-05-21 NOTE — Progress Notes (Signed)
[place] [facility] [place] [facility] [place]   Follow-up Note   Referring Provider: Crist Infante, MD Primary Provider: Crist Infante, MD Date of Office Visit: 05/21/2021  Subjective:   Allison Clark (DOB: July 27, 1951) is a 70 y.o. female who returns to the [facility] on 05/21/2021 in re-evaluation of the following:  HPI: Kashea presents to this clinic in evaluation of asthma and allergic rhinitis and LPR.  Her last visit to this clinic was 01 December 2020 at which point in time she had COVID.  Her COVID infection lasted less than 7 days and she had no long-term sequela as a result of that infection.  She has received 3 COVID vaccines.  While using benralizumab injections she has had excellent control of all of her respiratory tract issues.  She was able to go through the spring with no difficulty involving either her nose or chest.  After conclusion of the spring she eliminated use of her triple inhaler and her nasal steroid.  She still continues to do well and has no need to use any short acting bronchodilator and can exert herself without any problem.  Her reflux remains under very good control with the use of Dexilant.  Allergies as of 05/21/2021       Reactions   Avelox [moxifloxacin Hcl In Nacl] Hives   Sulfa Antibiotics Rash        Medication List    Breztri Aerosphere 160-9-4.8 MCG/ACT Aero Generic drug: Budeson-Glycopyrrol-Formoterol Inhale two puffs twice daily to prevent cough or wheeze.  Rinse, gargle, and spit after use.   dexlansoprazole 60 MG capsule Commonly known as: Dexilant TAKE 1 CAPSULE BY MOUTH BEFORE BREAKFAST   EPINEPHrine 0.3 mg/0.3 mL Soaj injection Commonly known as: EPI-PEN Use as directed for life threatening allergic reactions   ezetimibe 10 MG tablet Commonly known as: ZETIA Take 10 mg by mouth daily.   Fasenra 30 MG/ML Sosy Generic drug: Benralizumab Inject 30 mg into the skin every 8 (eight) weeks.    fluticasone 50 MCG/ACT nasal spray Commonly known as: FLONASE Place 1 spray into both nostrils daily.   ibandronate 150 MG tablet Commonly known as: BONIVA Take 150 mg by mouth every 30 (thirty) days.   levalbuterol 45 MCG/ACT inhaler Commonly known as: XOPENEX HFA INHALE 2 PUFFS BY MOUTH EVERY 4 TO 6 HOURS AS NEEDED FOR COUGH OR WHEEZE   levothyroxine 112 MCG tablet Commonly known as: SYNTHROID Take 112 mcg by mouth daily before breakfast.   loratadine 10 MG tablet Commonly known as: CLARITIN Take 10 mg by mouth daily.   olmesartan 20 MG tablet Commonly known as: BENICAR Take 20 mg by mouth daily.   simvastatin 20 MG tablet Commonly known as: ZOCOR Take 20 mg by mouth at bedtime.        Past Medical History:  Diagnosis Date   Asthma    BBB (bundle branch block)    left   Breast cancer ([place]) 2004   Left Breast Cancer   GERD (gastroesophageal reflux disease)    Thyroid disease     Past Surgical History:  Procedure Laterality Date   BREAST LUMPECTOMY Left 2004   malignant   BREAST SURGERY     STERILIZATION     THYROIDECTOMY     TONSILLECTOMY     TUBAL LIGATION      Review of systems negative except as noted in HPI / PMHx or noted below:  Review of Systems  Constitutional: Negative.  HENT: Negative.    Eyes: Negative.   Respiratory: Negative.    Cardiovascular: Negative.   Gastrointestinal: Negative.   Genitourinary: Negative.   Musculoskeletal: Negative.   Skin: Negative.   Neurological: Negative.   Endo/Heme/Allergies: Negative.   Psychiatric/Behavioral: Negative.      Objective:   Vitals:   05/21/21 1556  BP: 136/82  Pulse: 92  Resp: 16  SpO2: 98%      Weight: 189 lb 9.6 oz (86 kg)   Physical Exam Constitutional:      Appearance: She is not diaphoretic.  HENT:     Head: Normocephalic.     Right Ear: Tympanic membrane, ear canal and external ear normal.     Left Ear: Tympanic membrane, ear canal and external ear normal.      Nose: Nose normal. No mucosal edema or rhinorrhea.     Mouth/Throat:     Pharynx: Uvula midline. No oropharyngeal exudate.  Eyes:     Conjunctiva/sclera: Conjunctivae normal.  Neck:     Thyroid: No thyromegaly.     Trachea: Trachea normal. No tracheal tenderness or tracheal deviation.  Cardiovascular:     Rate and Rhythm: Normal rate and regular rhythm.     Heart sounds: Normal heart sounds, S1 normal and S2 normal. No murmur heard. Pulmonary:     Effort: No respiratory distress.     Breath sounds: Normal breath sounds. No stridor. No wheezing or rales.  Lymphadenopathy:     Head:     Right side of head: No tonsillar adenopathy.     Left side of head: No tonsillar adenopathy.     Cervical: No cervical adenopathy.  Skin:    Findings: No erythema or rash.     Nails: There is no clubbing.  Neurological:     Mental Status: She is alert.    Diagnostics:    Spirometry was performed and demonstrated an FEV1 of 1.60 at 75 % of predicted.  Assessment and Plan:   1. Asthma, severe persistent, well-controlled   2. Other allergic rhinitis   3. Laryngopharyngeal reflux (LPR)     1.  Treat and prevent inflammation:   A. Benralizumab autoinjector every 8 weeks  2. Continue Dexilant '60mg'$  one tablet daily  3. Can restart Breztri - 2 inhalations 1-2 times per day during periods of lower airway symptoms  4. Can restart Flonase - 1 spray each nostril 1-2 times per day during periods of upper airway symptoms  5. Continue Claritin and levalbuterol HFA if needed  6. Return in 6 months or earlier if problem  7. Obtain fall flu vaccine  Uldine is really doing very well while using benralizumab as her major controller agent as well as therapy for her reflux.  We will keep her on the plan noted above and assuming she does well with this plan I will see her back in this clinic in 6 months or earlier if there is a problem.   [name], MD Allergy / Immunology [place]

## 2021-05-22 ENCOUNTER — Encounter: Payer: Self-pay | Admitting: Allergy and Immunology

## 2021-05-23 DIAGNOSIS — M859 Disorder of bone density and structure, unspecified: Secondary | ICD-10-CM | POA: Diagnosis not present

## 2021-05-23 DIAGNOSIS — R7301 Impaired fasting glucose: Secondary | ICD-10-CM | POA: Diagnosis not present

## 2021-05-23 DIAGNOSIS — E785 Hyperlipidemia, unspecified: Secondary | ICD-10-CM | POA: Diagnosis not present

## 2021-05-23 DIAGNOSIS — E039 Hypothyroidism, unspecified: Secondary | ICD-10-CM | POA: Diagnosis not present

## 2021-05-30 DIAGNOSIS — I2510 Atherosclerotic heart disease of native coronary artery without angina pectoris: Secondary | ICD-10-CM | POA: Diagnosis not present

## 2021-05-30 DIAGNOSIS — M8580 Other specified disorders of bone density and structure, unspecified site: Secondary | ICD-10-CM | POA: Diagnosis not present

## 2021-05-30 DIAGNOSIS — R82998 Other abnormal findings in urine: Secondary | ICD-10-CM | POA: Diagnosis not present

## 2021-05-30 DIAGNOSIS — I10 Essential (primary) hypertension: Secondary | ICD-10-CM | POA: Diagnosis not present

## 2021-05-30 DIAGNOSIS — E669 Obesity, unspecified: Secondary | ICD-10-CM | POA: Diagnosis not present

## 2021-05-30 DIAGNOSIS — Z0000 Encounter for general adult medical examination without abnormal findings: Secondary | ICD-10-CM | POA: Diagnosis not present

## 2021-05-30 DIAGNOSIS — E785 Hyperlipidemia, unspecified: Secondary | ICD-10-CM | POA: Diagnosis not present

## 2021-05-30 DIAGNOSIS — I700 Atherosclerosis of aorta: Secondary | ICD-10-CM | POA: Diagnosis not present

## 2021-05-30 DIAGNOSIS — Z23 Encounter for immunization: Secondary | ICD-10-CM | POA: Diagnosis not present

## 2021-05-30 DIAGNOSIS — K7689 Other specified diseases of liver: Secondary | ICD-10-CM | POA: Diagnosis not present

## 2021-05-30 DIAGNOSIS — K760 Fatty (change of) liver, not elsewhere classified: Secondary | ICD-10-CM | POA: Diagnosis not present

## 2021-05-30 DIAGNOSIS — K5790 Diverticulosis of intestine, part unspecified, without perforation or abscess without bleeding: Secondary | ICD-10-CM | POA: Diagnosis not present

## 2021-05-30 DIAGNOSIS — Z853 Personal history of malignant neoplasm of breast: Secondary | ICD-10-CM | POA: Diagnosis not present

## 2021-05-30 DIAGNOSIS — Z1331 Encounter for screening for depression: Secondary | ICD-10-CM | POA: Diagnosis not present

## 2021-06-01 DIAGNOSIS — Z1212 Encounter for screening for malignant neoplasm of rectum: Secondary | ICD-10-CM | POA: Diagnosis not present

## 2021-06-25 ENCOUNTER — Other Ambulatory Visit: Payer: Self-pay | Admitting: *Deleted

## 2021-06-25 MED ORDER — FASENRA 30 MG/ML ~~LOC~~ SOSY: 30.0000 mg | PREFILLED_SYRINGE | SUBCUTANEOUS | 6 refills | Status: DC

## 2021-07-22 ENCOUNTER — Other Ambulatory Visit: Payer: Self-pay | Admitting: Allergy and Immunology

## 2021-08-11 DIAGNOSIS — Z23 Encounter for immunization: Secondary | ICD-10-CM | POA: Diagnosis not present

## 2021-10-17 ENCOUNTER — Other Ambulatory Visit: Payer: Self-pay | Admitting: Allergy and Immunology

## 2021-10-18 DIAGNOSIS — L820 Inflamed seborrheic keratosis: Secondary | ICD-10-CM | POA: Diagnosis not present

## 2021-10-18 DIAGNOSIS — L738 Other specified follicular disorders: Secondary | ICD-10-CM | POA: Diagnosis not present

## 2021-10-18 DIAGNOSIS — D485 Neoplasm of uncertain behavior of skin: Secondary | ICD-10-CM | POA: Diagnosis not present

## 2021-10-22 ENCOUNTER — Telehealth: Payer: Self-pay | Admitting: Allergy and Immunology

## 2021-10-22 MED ORDER — OMEPRAZOLE 40 MG PO CPDR: DELAYED_RELEASE_CAPSULE | ORAL | 5 refills | Status: DC

## 2021-10-22 NOTE — Telephone Encounter (Signed)
She can try omeprazole 40 mg - 1 tablet-1 or 2 times per day

## 2021-10-22 NOTE — Telephone Encounter (Signed)
Patient states the Dexilant and generic is costing her over $200. She checked with her insurance and they told her it is now Tier 4. She is wondering if there is a cheaper alternative that can be sent in or something OTC she can get.

## 2021-10-22 NOTE — Telephone Encounter (Signed)
According to online formulary Omeprazole is tier 1, and esomeprazole and lansoprazole are tier 2.  Please advise of change in medication.

## 2021-10-22 NOTE — Telephone Encounter (Signed)
Patient was informed and prescription was sent to [facility] in [place].

## 2021-10-24 DIAGNOSIS — M542 Cervicalgia: Secondary | ICD-10-CM | POA: Diagnosis not present

## 2021-10-24 DIAGNOSIS — I2510 Atherosclerotic heart disease of native coronary artery without angina pectoris: Secondary | ICD-10-CM | POA: Diagnosis not present

## 2021-10-24 DIAGNOSIS — M8580 Other specified disorders of bone density and structure, unspecified site: Secondary | ICD-10-CM | POA: Diagnosis not present

## 2021-10-24 DIAGNOSIS — I700 Atherosclerosis of aorta: Secondary | ICD-10-CM | POA: Diagnosis not present

## 2021-10-24 DIAGNOSIS — E785 Hyperlipidemia, unspecified: Secondary | ICD-10-CM | POA: Diagnosis not present

## 2021-10-24 DIAGNOSIS — K219 Gastro-esophageal reflux disease without esophagitis: Secondary | ICD-10-CM | POA: Diagnosis not present

## 2021-11-14 DIAGNOSIS — M542 Cervicalgia: Secondary | ICD-10-CM | POA: Diagnosis not present

## 2021-11-20 DIAGNOSIS — M542 Cervicalgia: Secondary | ICD-10-CM | POA: Diagnosis not present

## 2021-11-21 ENCOUNTER — Encounter: Payer: Self-pay | Admitting: Allergy and Immunology

## 2021-11-21 ENCOUNTER — Ambulatory Visit: Payer: PPO | Admitting: Allergy and Immunology

## 2021-11-21 ENCOUNTER — Other Ambulatory Visit: Payer: Self-pay

## 2021-11-21 VITALS — BP 122/66 | HR 88 | Resp 20 | Ht 62.4 in | Wt 195.4 lb

## 2021-11-21 DIAGNOSIS — J3089 Other allergic rhinitis: Secondary | ICD-10-CM | POA: Diagnosis not present

## 2021-11-21 DIAGNOSIS — K219 Gastro-esophageal reflux disease without esophagitis: Secondary | ICD-10-CM

## 2021-11-21 DIAGNOSIS — J4550 Severe persistent asthma, uncomplicated: Secondary | ICD-10-CM | POA: Diagnosis not present

## 2021-11-21 NOTE — Progress Notes (Signed)
[place] [facility] [place] [facility] [place]   Follow-up Note  Referring Provider: Crist Infante, MD Primary Provider: Crist Infante, MD Date of Office Visit: 11/21/2021  Subjective:   Allison Clark (DOB: 09-28-1951) is a 71 y.o. female who returns to the [facility] on 11/21/2021 in re-evaluation of the following:  HPI: Allison Clark returns to this clinic in evaluation of severe asthma treated with benralizumab, allergic rhinitis, and LPR.  I last saw her in this clinic on 21 May 2021.  She has really done well without any significant respiratory tract symptoms and she can exert herself without any problem and she does not use a short acting bronchodilator and has not required a systemic steroid or antibiotic for any type of airway issue.  She is now using omeprazole instead of Dexilant and this is working quite well to control her reflux.  She has received this year's flu vaccine and she has received 3 COVID vaccines and was infected with COVID on 1 occasion.  Allergies as of 11/21/2021       Reactions   Avelox [moxifloxacin Hcl In Nacl] Hives   Sulfa Antibiotics Rash        Medication List    Breztri Aerosphere 160-9-4.8 MCG/ACT Aero Generic drug: Budeson-Glycopyrrol-Formoterol Inhale two puffs twice daily to prevent cough or wheeze.  Rinse, gargle, and spit after use.   EPINEPHrine 0.3 mg/0.3 mL Soaj injection Commonly known as: EPI-PEN Use as directed for life threatening allergic reactions   Fasenra 30 MG/ML Sosy Generic drug: Benralizumab Inject 1 mL (30 mg total) into the skin every 8 (eight) weeks.   fluticasone 50 MCG/ACT nasal spray Commonly known as: FLONASE Place 1 spray into both nostrils daily.   ibandronate 150 MG tablet Commonly known as: BONIVA Take 150 mg by mouth every 30 (thirty) days.   levalbuterol 45 MCG/ACT inhaler Commonly known as: XOPENEX HFA INHALE 2 PUFFS BY MOUTH EVERY 4 TO 6 HOURS AS NEEDED FOR COUGH OR  WHEEZE   levothyroxine 112 MCG tablet Commonly known as: SYNTHROID Take 112 mcg by mouth daily before breakfast.   loratadine 10 MG tablet Commonly known as: CLARITIN Take 10 mg by mouth daily.   olmesartan 20 MG tablet Commonly known as: BENICAR Take 20 mg by mouth daily.   omeprazole 40 MG capsule Commonly known as: PRILOSEC 1 capsule 1-2 times per day   simvastatin 20 MG tablet Commonly known as: ZOCOR Take 20 mg by mouth at bedtime.    Past Medical History:  Diagnosis Date   Asthma    BBB (bundle branch block)    left   Breast cancer ([place]) 2004   Left Breast Cancer   GERD (gastroesophageal reflux disease)    Thyroid disease     Past Surgical History:  Procedure Laterality Date   BREAST LUMPECTOMY Left 2004   malignant   BREAST SURGERY     STERILIZATION     THYROIDECTOMY     TONSILLECTOMY     TUBAL LIGATION      Review of systems negative except as noted in HPI / PMHx or noted below:  Review of Systems  Constitutional: Negative.   HENT: Negative.    Eyes: Negative.   Respiratory: Negative.    Cardiovascular: Negative.   Gastrointestinal: Negative.   Genitourinary: Negative.   Musculoskeletal: Negative.   Skin: Negative.   Neurological: Negative.   Endo/Heme/Allergies: Negative.   Psychiatric/Behavioral: Negative.      Objective:   Vitals:  11/21/21 1400 11/21/21 1406  BP: 122/66   Pulse: 88   Resp: 20   SpO2: 91% 90%   Height: 5' 2.4" (158.5 cm)  Weight: 195 lb 6.4 oz (88.6 kg)   Physical Exam Constitutional:      Appearance: She is not diaphoretic.  HENT:     Head: Normocephalic.     Right Ear: Tympanic membrane, ear canal and external ear normal.     Left Ear: Tympanic membrane, ear canal and external ear normal.     Nose: Nose normal. No mucosal edema or rhinorrhea.     Mouth/Throat:     Pharynx: Uvula midline. No oropharyngeal exudate.  Eyes:     Conjunctiva/sclera: Conjunctivae normal.  Neck:     Thyroid: No thyromegaly.      Trachea: Trachea normal. No tracheal tenderness or tracheal deviation.  Cardiovascular:     Rate and Rhythm: Normal rate and regular rhythm.     Heart sounds: Normal heart sounds, S1 normal and S2 normal. No murmur heard. Pulmonary:     Effort: No respiratory distress.     Breath sounds: Normal breath sounds. No stridor. No wheezing or rales.  Lymphadenopathy:     Head:     Right side of head: No tonsillar adenopathy.     Left side of head: No tonsillar adenopathy.     Cervical: No cervical adenopathy.  Skin:    Findings: No erythema or rash.     Nails: There is no clubbing.  Neurological:     Mental Status: She is alert.    Diagnostics:    Spirometry was performed and demonstrated an FEV1 of 1.19 at 58 % of predicted.  Oxygen saturation on room air at rest was 91%.  Oxygen saturation on room air while walking the hallway was 90%.  Assessment and Plan:   1. Asthma, severe persistent, well-controlled   2. Other allergic rhinitis   3. Laryngopharyngeal reflux (LPR)     1.  Treat and prevent inflammation:   A. Benralizumab autoinjector every 8 weeks  2. Continue Omeprazole 40 mg - 1 tablet daily  3. START Breztri - 2 inhalations 2 times per day with spacer  4. START Prednisone 10 mg - 1 tablet 1 time per day for 10 days only  4. Can restart Flonase - 1 spray each nostril 1-2 times per day during periods of upper airway symptoms  5. Continue Claritin and levalbuterol HFA if needed  6. Return in 2 weeks or earlier if problem  7. Obtain fall flu vaccine  Allison Clark appears to have a diminished spirometry and a diminished oxygenation without symptomatology suggesting significant airway inflammation and we are going to treat her with anti-inflammatory agents for her airway above and beyond her benralizumab injection with the therapy noted above and regroup with her in 2 weeks to assess her response to this approach.  Allison Katz, MD Allergy / Immunology [place]

## 2021-11-21 NOTE — Patient Instructions (Addendum)
°  1.  Treat and prevent inflammation:   A. Benralizumab autoinjector every 8 weeks  2. Continue Omeprazole 40 mg - 1 tablet daily  3. START Breztri - 2 inhalations 2 times per day with spacer  4. START Prednisone 10 mg - 1 tablet 1 time per day for 10 days only  4. Can restart Flonase - 1 spray each nostril 1-2 times per day during periods of upper airway symptoms  5. Continue Claritin and levalbuterol HFA if needed  6. Return in 2 weeks or earlier if problem  7. Obtain fall flu vaccine

## 2021-11-22 ENCOUNTER — Encounter: Payer: Self-pay | Admitting: Allergy and Immunology

## 2021-11-22 DIAGNOSIS — M542 Cervicalgia: Secondary | ICD-10-CM | POA: Diagnosis not present

## 2021-11-27 DIAGNOSIS — M542 Cervicalgia: Secondary | ICD-10-CM | POA: Diagnosis not present

## 2021-12-05 ENCOUNTER — Ambulatory Visit: Payer: PPO | Admitting: Allergy and Immunology

## 2021-12-10 ENCOUNTER — Ambulatory Visit: Payer: PPO | Admitting: Allergy and Immunology

## 2021-12-10 ENCOUNTER — Telehealth: Payer: Self-pay

## 2021-12-10 ENCOUNTER — Other Ambulatory Visit: Payer: Self-pay

## 2021-12-10 ENCOUNTER — Encounter: Payer: Self-pay | Admitting: Allergy and Immunology

## 2021-12-10 VITALS — BP 128/66 | HR 84 | Resp 20

## 2021-12-10 DIAGNOSIS — K219 Gastro-esophageal reflux disease without esophagitis: Secondary | ICD-10-CM

## 2021-12-10 DIAGNOSIS — J4550 Severe persistent asthma, uncomplicated: Secondary | ICD-10-CM | POA: Diagnosis not present

## 2021-12-10 DIAGNOSIS — R0689 Other abnormalities of breathing: Secondary | ICD-10-CM | POA: Diagnosis not present

## 2021-12-10 DIAGNOSIS — J3089 Other allergic rhinitis: Secondary | ICD-10-CM

## 2021-12-10 MED ORDER — ALVESCO 160 MCG/ACT IN AERS: INHALATION_SPRAY | RESPIRATORY_TRACT | 5 refills | Status: DC

## 2021-12-10 NOTE — Telephone Encounter (Signed)
Called patient and informed her that she is scheduled for her ECHO on Wednesday, March 1st needing to check-in at 8:30am at [facility].  I told her she can also get the Chest x-ray done at that time too.  Patient is planning to come by the office to pick up the requisition forms.  Note: No prior authorization needed for Echo per [employer]). [facility] is in network with [employer]

## 2021-12-10 NOTE — Patient Instructions (Addendum)
°  1.  Treat and prevent inflammation:   A. Benralizumab autoinjector every 8 weeks  2. Continue Omeprazole 40 mg - 1 tablet daily  3. Obtain chest X-ray  4. Obtain Echo for deoxygenation  5. Start [place] 160 - 1 inhalation 2 times per day w/ spacer   6. Engage is slowly progressive aerobic exercise daily  7. Can restart Flonase - 1 spray each nostril 1-2 times per day during periods of upper airway symptoms  8. Continue Claritin and levalbuterol HFA if needed  9. Return in 4 weeks or earlier if problem

## 2021-12-10 NOTE — Progress Notes (Signed)
[place] [facility] [place] [facility] [place]   Follow-up Note  Referring Provider: Crist Infante, MD Primary Provider: Crist Infante, MD Date of Office Visit: 12/10/2021  Subjective:   Allison Clark (DOB: Dec 13, 1950) is a 71 y.o. female who returns to the [facility] [place] on 12/10/2021 in re-evaluation of the following:  HPI: Allison Clark returns to this clinic in evaluation of asthma, allergic rhinitis, and LPR.  I last saw in this clinic on 21 November 2021.  During her last visit she had diminished airflow and diminished oxygenation during exercise and we gave her a systemic steroid and started her on a triple inhaler.  In the past 2 weeks she has not really noticed any difference regarding her symptoms but in fact she had very few symptoms that she could detect in association with her spirometric abnormalities and oxygen issues.  She had to discontinue her triple inhaler because it was giving rise to some hoarseness.  Allergies as of 12/10/2021       Reactions   Avelox [moxifloxacin Hcl In Nacl] Hives   Sulfa Antibiotics Rash        Medication List    Breztri Aerosphere 160-9-4.8 MCG/ACT Aero Generic drug: Budeson-Glycopyrrol-Formoterol Inhale two puffs twice daily to prevent cough or wheeze.  Rinse, gargle, and spit after use.   EPINEPHrine 0.3 mg/0.3 mL Soaj injection Commonly known as: EPI-PEN Use as directed for life threatening allergic reactions   Fasenra 30 MG/ML Sosy Generic drug: Benralizumab Inject 1 mL (30 mg total) into the skin every 8 (eight) weeks.   fluticasone 50 MCG/ACT nasal spray Commonly known as: FLONASE Place 1 spray into both nostrils daily.   ibandronate 150 MG tablet Commonly known as: BONIVA Take 150 mg by mouth every 30 (thirty) days.   levalbuterol 45 MCG/ACT inhaler Commonly known as: XOPENEX HFA INHALE 2 PUFFS BY MOUTH EVERY 4 TO 6 HOURS AS NEEDED FOR COUGH OR WHEEZE   levothyroxine 112 MCG tablet Commonly  known as: SYNTHROID Take 112 mcg by mouth daily before breakfast.   loratadine 10 MG tablet Commonly known as: CLARITIN Take 10 mg by mouth daily.   olmesartan 20 MG tablet Commonly known as: BENICAR Take 20 mg by mouth daily.   omeprazole 40 MG capsule Commonly known as: PRILOSEC 1 capsule 1-2 times per day   simvastatin 20 MG tablet Commonly known as: ZOCOR Take 20 mg by mouth at bedtime.   VITAMIN D3 PO Take by mouth.    Past Medical History:  Diagnosis Date   Asthma    BBB (bundle branch block)    left   Breast cancer ([place]) 2004   Left Breast Cancer   GERD (gastroesophageal reflux disease)    Thyroid disease     Past Surgical History:  Procedure Laterality Date   BREAST LUMPECTOMY Left 2004   malignant   BREAST SURGERY     STERILIZATION     THYROIDECTOMY     TONSILLECTOMY     TUBAL LIGATION      Review of systems negative except as noted in HPI / PMHx or noted below:  Review of Systems  Constitutional: Negative.   HENT: Negative.    Eyes: Negative.   Respiratory: Negative.    Cardiovascular: Negative.   Gastrointestinal: Negative.   Genitourinary: Negative.   Musculoskeletal: Negative.   Skin: Negative.   Neurological: Negative.   Endo/Heme/Allergies: Negative.   Psychiatric/Behavioral: Negative.      Objective:   Vitals:  12/10/21 1349  BP: 128/66  Pulse: 84  Resp: 20  SpO2: 93%          Physical Exam Constitutional:      Appearance: She is not diaphoretic.  HENT:     Head: Normocephalic.     Right Ear: Tympanic membrane, ear canal and external ear normal.     Left Ear: Tympanic membrane, ear canal and external ear normal.     Nose: Nose normal. No mucosal edema or rhinorrhea.     Mouth/Throat:     Pharynx: Uvula midline. No oropharyngeal exudate.  Eyes:     Conjunctiva/sclera: Conjunctivae normal.  Neck:     Thyroid: No thyromegaly.     Trachea: Trachea normal. No tracheal tenderness or tracheal deviation.   Cardiovascular:     Rate and Rhythm: Normal rate and regular rhythm.     Heart sounds: Normal heart sounds, S1 normal and S2 normal. No murmur heard. Pulmonary:     Effort: No respiratory distress.     Breath sounds: Normal breath sounds. No stridor. No wheezing or rales.  Lymphadenopathy:     Head:     Right side of head: No tonsillar adenopathy.     Left side of head: No tonsillar adenopathy.     Cervical: No cervical adenopathy.  Skin:    Findings: No erythema or rash.     Nails: There is no clubbing.  Neurological:     Mental Status: She is alert.    Diagnostics:    Spirometry was performed and demonstrated an FEV1 of 1.36 at 67 % of predicted.  Her previous FEV1 was 1.19.  Oxygen saturation on room air during rest was 93%.  Oxygen saturation on room air while walking the hallway was 89%  Assessment and Plan:   1. Asthma, severe persistent, well-controlled   2. Other allergic rhinitis   3. Laryngopharyngeal reflux (LPR)   4. Impaired oxygenation    1.  Treat and prevent inflammation:   A. Benralizumab autoinjector every 8 weeks  2. Continue Omeprazole 40 mg - 1 tablet daily  3. Obtain chest X-ray  4. Obtain Echo for deoxygenation  5. Start Alvesco 160 - 1 inhalation 2 times per day w/ spacer   6. Engage is slowly progressive aerobic exercise daily  7. Can restart Flonase - 1 spray each nostril 1-2 times per day during periods of upper airway symptoms  8. Continue Claritin and levalbuterol HFA if needed  9. Return in 4 weeks or earlier if problem  Allison Clark appears to have some issues oxygenating.  I am not entirely sure that this is secondary to inflammation of her airway although there was some improvement in her spirometric evaluation as a result of receiving systemic steroids and using a triple inhaler since her last visit.  She can't continue on her triple inhaler as she developed significant hoarseness when utilizing this agent and we will give her Alvesco  as noted above.  I would like to screen her cardiopulmonary system with a chest x-ray and an echocardiogram.  She may require a CT scan angio of her chest if she does not have improvement regarding this exercise-induced deoxygenation.  I have encouraged her to engage in some type of walking activity on a progressive basis as a fair amount of her issue may be all tied up with cardiac deconditioning as she has really performed no exercise in years.  I will see her back in this clinic in 4 weeks or earlier if there  is a problem.   [name], MD Allergy / Immunology [place]

## 2021-12-11 ENCOUNTER — Encounter: Payer: Self-pay | Admitting: Allergy and Immunology

## 2021-12-12 ENCOUNTER — Other Ambulatory Visit: Payer: Self-pay | Admitting: *Deleted

## 2021-12-12 DIAGNOSIS — R0602 Shortness of breath: Secondary | ICD-10-CM | POA: Diagnosis not present

## 2021-12-12 DIAGNOSIS — R9431 Abnormal electrocardiogram [ECG] [EKG]: Secondary | ICD-10-CM

## 2021-12-12 DIAGNOSIS — I081 Rheumatic disorders of both mitral and tricuspid valves: Secondary | ICD-10-CM | POA: Diagnosis not present

## 2021-12-12 DIAGNOSIS — R918 Other nonspecific abnormal finding of lung field: Secondary | ICD-10-CM | POA: Diagnosis not present

## 2021-12-12 DIAGNOSIS — R0600 Dyspnea, unspecified: Secondary | ICD-10-CM

## 2021-12-12 DIAGNOSIS — I340 Nonrheumatic mitral (valve) insufficiency: Secondary | ICD-10-CM

## 2021-12-12 DIAGNOSIS — R931 Abnormal findings on diagnostic imaging of heart and coronary circulation: Secondary | ICD-10-CM

## 2021-12-12 DIAGNOSIS — I361 Nonrheumatic tricuspid (valve) insufficiency: Secondary | ICD-10-CM

## 2021-12-12 DIAGNOSIS — R0689 Other abnormalities of breathing: Secondary | ICD-10-CM | POA: Diagnosis not present

## 2021-12-12 MED ORDER — FUROSEMIDE 20 MG PO TABS: ORAL_TABLET | ORAL | 5 refills | Status: DC

## 2021-12-13 ENCOUNTER — Ambulatory Visit: Payer: PPO | Admitting: Cardiology

## 2021-12-13 ENCOUNTER — Encounter: Payer: Self-pay | Admitting: Cardiology

## 2021-12-13 ENCOUNTER — Other Ambulatory Visit: Payer: Self-pay

## 2021-12-13 VITALS — BP 144/78 | HR 81 | Ht 62.5 in | Wt 195.2 lb

## 2021-12-13 DIAGNOSIS — I493 Ventricular premature depolarization: Secondary | ICD-10-CM

## 2021-12-13 DIAGNOSIS — I700 Atherosclerosis of aorta: Secondary | ICD-10-CM

## 2021-12-13 DIAGNOSIS — D649 Anemia, unspecified: Secondary | ICD-10-CM | POA: Insufficient documentation

## 2021-12-13 DIAGNOSIS — J069 Acute upper respiratory infection, unspecified: Secondary | ICD-10-CM

## 2021-12-13 DIAGNOSIS — M8580 Other specified disorders of bone density and structure, unspecified site: Secondary | ICD-10-CM

## 2021-12-13 DIAGNOSIS — K7689 Other specified diseases of liver: Secondary | ICD-10-CM | POA: Insufficient documentation

## 2021-12-13 DIAGNOSIS — I10 Essential (primary) hypertension: Secondary | ICD-10-CM | POA: Diagnosis not present

## 2021-12-13 DIAGNOSIS — E039 Hypothyroidism, unspecified: Secondary | ICD-10-CM

## 2021-12-13 DIAGNOSIS — G720 Drug-induced myopathy: Secondary | ICD-10-CM | POA: Insufficient documentation

## 2021-12-13 DIAGNOSIS — I420 Dilated cardiomyopathy: Secondary | ICD-10-CM | POA: Diagnosis not present

## 2021-12-13 DIAGNOSIS — E782 Mixed hyperlipidemia: Secondary | ICD-10-CM

## 2021-12-13 DIAGNOSIS — Z853 Personal history of malignant neoplasm of breast: Secondary | ICD-10-CM

## 2021-12-13 DIAGNOSIS — H6120 Impacted cerumen, unspecified ear: Secondary | ICD-10-CM | POA: Insufficient documentation

## 2021-12-13 HISTORY — DX: Dilated cardiomyopathy: I42.0

## 2021-12-13 HISTORY — DX: Personal history of malignant neoplasm of breast: Z85.3

## 2021-12-13 HISTORY — DX: Impacted cerumen, unspecified ear: H61.20

## 2021-12-13 HISTORY — DX: Anemia, unspecified: D64.9

## 2021-12-13 HISTORY — DX: Other specified diseases of liver: K76.89

## 2021-12-13 HISTORY — DX: Acute upper respiratory infection, unspecified: J06.9

## 2021-12-13 HISTORY — DX: Atherosclerosis of aorta: I70.0

## 2021-12-13 HISTORY — DX: Drug-induced myopathy: G72.0

## 2021-12-13 HISTORY — DX: Other specified disorders of bone density and structure, unspecified site: M85.80

## 2021-12-13 NOTE — Patient Instructions (Signed)
Medication Instructions:  ?Your physician recommends that you continue on your current medications as directed. Please refer to the Current Medication list given to you today. ? ?*If you need a refill on your cardiac medications before your next appointment, please call your pharmacy* ? ? ?Lab Work: ?Your physician recommends that you return for lab work in:  ? ?Labs today: CMP, Total iron Binding capacity, Ferritin, TSH, Pro BNP ? ?If you have labs (blood work) drawn today and your tests are completely normal, you will receive your results only by: ?MyChart Message (if you have MyChart) OR ?A paper copy in the mail ?If you have any lab test that is abnormal or we need to change your treatment, we will call you to review the results. ? ? ?Testing/Procedures: ?None ? ? ?Follow-Up: ?At [facility], you and your health needs are our priority.  As part of our continuing mission to provide you with exceptional heart care, we have created designated Provider Care Teams.  These Care Teams include your primary Cardiologist (physician) and Advanced Practice Providers (APPs -  Physician Assistants and Nurse Practitioners) who all work together to provide you with the care you need, when you need it. ? ?We recommend signing up for the patient portal called "MyChart".  Sign up information is provided on this After Visit Summary.  MyChart is used to connect with patients for Virtual Visits (Telemedicine).  Patients are able to view lab/test results, encounter notes, upcoming appointments, etc.  Non-urgent messages can be sent to your provider as well.   ?To learn more about what you can do with MyChart, go to [email or web address].   ? ?Your next appointment:   ?2 week(s) ? ?The format for your next appointment:   ?In Person ? ?Provider:   ?Jenne Campus, MD  ? ? ?Other Instructions ?None ? ?

## 2021-12-13 NOTE — Progress Notes (Signed)
Cardiology Consultation:    Date:  12/13/2021   ID:  Allison Clark, DOB 07-19-1951, MRN [id number]  PCP:  Crist Infante, MD  Cardiologist:  Jenne Campus, MD   Referring MD: Crist Infante, MD   Chief Complaint  Patient presents with   Abnormal ECHO    History of Present Illness:    Allison Clark is a 71 y.o. female who is being seen today for the evaluation of cardiomyopathy at the request of Crist Infante, MD. with past medical history significant for chronic left bundle branch block, hypothyroidism, history of breast cancer, bronchial asthma.  She was referred to [place] because recently she had echocardiogram done which showed ejection fraction 25 to 30% which is new discovery.  Apparently lady has been struggling with asthma for many years lately became somewhat worse.  What happened lately she went to her allergist office because of some shortness of breath, her oxygen saturation has been checked which was low and there was significant drop in oxygen saturation with exercise.  That prompted echocardiogram which showed ejection fraction 25 to 30%.  Overall she says she is doing fair.  She was able to walk a lot recently when she was in [place] and she had no much difficulty doing that except she said that she is out of shape.  She does have some swelling of lower extremities sometimes.  She cannot lay flat on the back during the night.  She snores somewhat.  She said however she is only 1 pillow and sleeps on the left side.  She denies have any chest pain tightness squeezing pressure mid chest no palpitations dizziness. She never smoked Does have family history of coronary arteries but not premature. Does not exercise on the regular basis she is not on any diet.  Past Medical History:  Diagnosis Date   Asthma    BBB (bundle branch block)    left   Breast cancer ([place]) 2004   Left Breast Cancer   GERD (gastroesophageal reflux disease)    Thyroid disease     Past Surgical  History:  Procedure Laterality Date   BREAST LUMPECTOMY Left 2004   malignant   BREAST SURGERY     STERILIZATION     THYROIDECTOMY     TONSILLECTOMY     TUBAL LIGATION      Current Medications: Current Meds  Medication Sig   ALVESCO 160 MCG/ACT inhaler Inhale one puff with spacer twice daily to prevent cough or wheeze.  Rinse, gargle, and spit after use. (Patient taking differently: Inhale 1 puff into the lungs 2 (two) times daily. Inhale one puff with spacer twice daily to prevent cough or wheeze.  Rinse, gargle, and spit after use.)   Benralizumab (FASENRA) 30 MG/ML SOSY Inject 1 mL (30 mg total) into the skin every 8 (eight) weeks.   Cholecalciferol (VITAMIN D3 PO) Take by mouth.   EPINEPHrine 0.3 mg/0.3 mL IJ SOAJ injection Use as directed for life threatening allergic reactions (Patient taking differently: Inject 0.3 mg into the muscle as needed for anaphylaxis. Use as directed for life threatening allergic reactions)   fluticasone (FLONASE) 50 MCG/ACT nasal spray Place 1 spray into both nostrils daily.    ibandronate (BONIVA) 150 MG tablet Take 150 mg by mouth every 30 (thirty) days.   levalbuterol (XOPENEX HFA) 45 MCG/ACT inhaler INHALE 2 PUFFS BY MOUTH EVERY 4 TO 6 HOURS AS NEEDED FOR COUGH OR WHEEZE (Patient taking differently: Inhale 2 puffs into the lungs every 6 (  six) hours as needed for wheezing or shortness of breath. INHALE 2 PUFFS BY MOUTH EVERY 4 TO 6 HOURS AS NEEDED FOR COUGH OR WHEEZE)   levothyroxine (SYNTHROID, LEVOTHROID) 112 MCG tablet Take 112 mcg by mouth daily before breakfast.    loratadine (CLARITIN) 10 MG tablet Take 10 mg by mouth daily.   olmesartan (BENICAR) 20 MG tablet Take 20 mg by mouth daily.   omeprazole (PRILOSEC) 40 MG capsule 1 capsule 1-2 times per day (Patient taking differently: Take 40 mg by mouth daily. 1 capsule 1-2 times per day)   simvastatin (ZOCOR) 20 MG tablet Take 20 mg by mouth at bedtime.      Allergies:   Avelox [moxifloxacin hcl in  nacl], Albuterol, Anastrozole, Atorvastatin, Ciclesonide, Ezetimibe, Metronidazole, and Sulfa antibiotics   Social History   Socioeconomic History   Marital status: Married    Spouse name: Not on file   Number of children: Not on file   Years of education: Not on file   Highest education level: Not on file  Occupational History   Not on file  Tobacco Use   Smoking status: Never   Smokeless tobacco: Never  Vaping Use   Vaping Use: Never used  Substance and Sexual Activity   Alcohol use: No   Drug use: No   Sexual activity: Not Currently  Other Topics Concern   Not on file  Social History Narrative   Not on file   Social Determinants of Health   Financial Resource Strain: Not on file  Food Insecurity: Not on file  Transportation Needs: Not on file  Physical Activity: Not on file  Stress: Not on file  Social Connections: Not on file     Family History: The patient's family history includes Allergies in her son. There is no history of Allergic rhinitis, Angioedema, Asthma, Eczema, Immunodeficiency, or Urticaria. ROS:   Please see the history of present illness.    All 14 point review of systems negative except as described per history of present illness.  EKGs/Labs/Other Studies Reviewed:    The following studies were reviewed today: Echocardiogram reviewed which showed moderately dilated left ventricle with global hypokinesis ejection fraction 25 to 30%.  Mild mitral valve regurgitation mitral cuspid valve regurgitation, pulmonary pressure was normal  EKG:  EKG is  ordered today.  The ekg ordered today demonstrates normal sinus rhythm, left bundle branch block.  Recent Labs: No results found for requested labs within last 8760 hours.  Recent Lipid Panel No results found for: CHOL, TRIG, HDL, CHOLHDL, VLDL, LDLCALC, LDLDIRECT  Physical Exam:    VS:  BP (!) 144/78 (BP Location: Right Arm, Patient Position: Sitting)    Pulse 81    Ht 5' 2.5" (1.588 m)    Wt 195 lb  3.2 oz (88.5 kg)    SpO2 93%    BMI 35.13 kg/m     Wt Readings from Last 3 Encounters:  12/13/21 195 lb 3.2 oz (88.5 kg)  11/21/21 195 lb 6.4 oz (88.6 kg)  05/21/21 189 lb 9.6 oz (86 kg)     GEN:  Well nourished, well developed in no acute distress HEENT: Normal NECK: JVD up to 25 degrees, no carotid bruits LYMPHATICS: No lymphadenopathy CARDIAC: RRR, no murmurs, no rubs, S3 gallop heard RESPIRATORY:  Clear to auscultation without rales, wheezing or rhonchi  ABDOMEN: Soft, non-tender, non-distended MUSCULOSKELETAL:  No edema; No deformity, minimal swelling SKIN: Warm and dry NEUROLOGIC:  Alert and oriented x 3 PSYCHIATRIC:  Normal affect  ASSESSMENT:    1. Essential hypertension   2. Dilated cardiomyopathy ([place])   3. Hardening of the aorta (main artery of the heart) ([place])   4. Ventricular premature depolarization   5. Acquired hypothyroidism   6. Mixed hyperlipidemia    PLAN:    In order of problems listed above:  Cardiomyopathy which is dilated.  Ejection fraction 25 to 30%.  Lady have known left bundle branch block for many years apparently.  Diagnosis has been established 20 years ago.  On the physical exam she is only mildly decompensated.  I will check Chem-7 today as well as proBNP.  She will start 20 mg of Lasix as instructed by her allergist which I think is an excellent idea.  She is already on ARB however gradually we will have to put her on guideline directed medical therapy.  First we will try to diurese currently and after that we will switch her from Benicar to [place].  She is not wheezing today on my physical exam however addition of beta-blocker may be challenging because of history of bronchial asthma.  In the future she can benefit from SGL 2 inhibitor as well as Aldactone.  We will have to see her quickly within next few weeks until we get her on appropriate medical therapy.  In terms of etiology of this phenomenon is unclear at this stage.  She did have  coronary CT calcium score which was only mildly elevated with a total calcium score of 36.6 majority of this was in LAD.  Therefore I do not think we dealing with triple-vessel disease left main coronary artery disease however in the future we most likely end up doing coronary CT angio to look at her coronary arteries.  We will also check TSH, ferritin total) capacity to look for secondary causes of cardiomyopathy Essential hypertension blood pressure elevated today to first visit to my office will add diuretic which should help.  And gradual we will put her on appropriate medication to help with blood pressure measurements. Calcification of the coronary artery: Discussion as above. PVCs.  She denies having any palpitations for now. Dyslipidemia I did review her K PN data from August of last year show LDL of 68 and HDL 44.  We will continue present management which include simvastatin clinic. Bronchial asthma: Excellently managed by Dr. Theora Gianotti   Medication Adjustments/Labs and Tests Ordered: Current medicines are reviewed at length with the patient today.  Concerns regarding medicines are outlined above.  No orders of the defined types were placed in this encounter.  No orders of the defined types were placed in this encounter.   Signed, Allison Liter, MD, [facility]. 12/13/2021 2:13 PM    [facility]

## 2021-12-14 LAB — COMPREHENSIVE METABOLIC PANEL
ALT: 14 IU/L (ref 0–32)
AST: 14 IU/L (ref 0–40)
Albumin/Globulin Ratio: 2.4 — ABNORMAL HIGH (ref 1.2–2.2)
Albumin: 4.5 g/dL (ref 3.7–4.7)
Alkaline Phosphatase: 49 IU/L (ref 44–121)
BUN/Creatinine Ratio: 22 (ref 12–28)
BUN: 15 mg/dL (ref 8–27)
Bilirubin Total: 0.4 mg/dL (ref 0.0–1.2)
CO2: 24 mmol/L (ref 20–29)
Calcium: 9.3 mg/dL (ref 8.7–10.3)
Chloride: 104 mmol/L (ref 96–106)
Creatinine, Ser: 0.67 mg/dL (ref 0.57–1.00)
Globulin, Total: 1.9 g/dL (ref 1.5–4.5)
Glucose: 90 mg/dL (ref 70–99)
Potassium: 4.4 mmol/L (ref 3.5–5.2)
Sodium: 141 mmol/L (ref 134–144)
Total Protein: 6.4 g/dL (ref 6.0–8.5)
eGFR: 93 mL/min/{1.73_m2} (ref >59)

## 2021-12-14 LAB — TSH: TSH: 2.99 u[IU]/mL (ref 0.450–4.500)

## 2021-12-14 LAB — IRON AND TIBC
Iron Saturation: 25 % (ref 15–55)
Iron: 87 ug/dL (ref 27–139)
Total Iron Binding Capacity: 348 ug/dL (ref 250–450)
UIBC: 261 ug/dL (ref 118–369)

## 2021-12-14 LAB — PRO B NATRIURETIC PEPTIDE: NT-Pro BNP: 221 pg/mL (ref 0–301)

## 2021-12-14 LAB — FERRITIN: Ferritin: 57 ng/mL (ref 15–150)

## 2021-12-19 ENCOUNTER — Telehealth: Payer: Self-pay

## 2021-12-19 NOTE — Telephone Encounter (Signed)
-----   Message from Park Liter, MD sent at 12/18/2021 10:33 AM EST ----- ?Labs are looking good, no evidence of CHF ?

## 2021-12-19 NOTE — Telephone Encounter (Signed)
Patient notified of results.

## 2021-12-20 ENCOUNTER — Encounter: Payer: Self-pay | Admitting: Cardiology

## 2022-01-01 ENCOUNTER — Encounter: Payer: Self-pay | Admitting: Allergy and Immunology

## 2022-01-02 ENCOUNTER — Ambulatory Visit: Payer: PPO | Admitting: Cardiology

## 2022-01-02 ENCOUNTER — Encounter: Payer: Self-pay | Admitting: Cardiology

## 2022-01-02 VITALS — BP 120/72 | HR 84 | Ht 62.5 in | Wt 188.0 lb

## 2022-01-02 DIAGNOSIS — I700 Atherosclerosis of aorta: Secondary | ICD-10-CM | POA: Diagnosis not present

## 2022-01-02 DIAGNOSIS — E039 Hypothyroidism, unspecified: Secondary | ICD-10-CM

## 2022-01-02 DIAGNOSIS — E782 Mixed hyperlipidemia: Secondary | ICD-10-CM | POA: Diagnosis not present

## 2022-01-02 DIAGNOSIS — I493 Ventricular premature depolarization: Secondary | ICD-10-CM

## 2022-01-02 DIAGNOSIS — I420 Dilated cardiomyopathy: Secondary | ICD-10-CM | POA: Diagnosis not present

## 2022-01-02 DIAGNOSIS — I10 Essential (primary) hypertension: Secondary | ICD-10-CM

## 2022-01-02 MED ORDER — ENTRESTO 24-26 MG PO TABS: 1.0000 | ORAL_TABLET | Freq: Two times a day (BID) | ORAL | 5 refills | Status: DC

## 2022-01-02 NOTE — Progress Notes (Signed)
?Cardiology Office Note:   ? ?Date:  01/02/2022  ? ?ID:  Allison Clark, DOB [birth year], MRN [id number] ? ?PCP:  Crist Infante, MD  ?Cardiologist:  Jenne Campus, MD   ? ?Referring MD: Crist Infante, MD  ? ?Chief Complaint  ?Patient presents with  ? multiple questions regarding last visit  ? Medication Management  ?   ?  ? ? ?History of Present Illness:   ? ?Allison Clark is a 71 y.o. female with past medical history significant for bundle branch block, recently she was recognized to have cardiomyopathy in the neighborhood of 30% ejection fraction.  Time gradually tried to put her on appropriate medications.  She said shortness of breath improved significantly she does not have any swelling of lower extremities.  She does not have paroxysmal nocturnal dyspnea.  Overall seems to be compensated on the physical exam today.  Denies having dizziness or passing out, we did thyroid check we did ferritin check all negative.  She does have mildly elevated calcium score of only 1 artery therefore I doubt very much triple-vessel disease I think we dealing with idiopathic cardiomyopathy. ? ?Past Medical History:  ?Diagnosis Date  ? Asthma   ? BBB (bundle branch block)   ? left  ? Breast cancer ([place]) 2004  ? Left Breast Cancer  ? GERD (gastroesophageal reflux disease)   ? Thyroid disease   ? ? ?Past Surgical History:  ?Procedure Laterality Date  ? BREAST LUMPECTOMY Left 2004  ? malignant  ? BREAST SURGERY    ? STERILIZATION    ? THYROIDECTOMY    ? TONSILLECTOMY    ? TUBAL LIGATION    ? ? ?Current Medications: ?Current Meds  ?Medication Sig  ? ALVESCO 160 MCG/ACT inhaler Inhale one puff with spacer twice daily to prevent cough or wheeze.  Rinse, gargle, and spit after use. (Patient taking differently: Inhale 1 puff into the lungs 2 (two) times daily. Inhale one puff with spacer twice daily to prevent cough or wheeze.  Rinse, gargle, and spit after use.)  ? Benralizumab (FASENRA) 30 MG/ML SOSY Inject 1 mL (30 mg total) into the  skin every 8 (eight) weeks.  ? Cholecalciferol (VITAMIN D3 PO) Take 1 tablet by mouth daily.  ? EPINEPHrine 0.3 mg/0.3 mL IJ SOAJ injection Use as directed for life threatening allergic reactions (Patient taking differently: Inject 0.3 mg into the muscle as needed for anaphylaxis. Use as directed for life threatening allergic reactions)  ? fluticasone (FLONASE) 50 MCG/ACT nasal spray Place 1 spray into both nostrils daily.   ? furosemide (LASIX) 20 MG tablet Take one tablet once daily once daily (Patient taking differently: Take 20 mg by mouth daily. Take one tablet once daily once daily)  ? ibandronate (BONIVA) 150 MG tablet Take 150 mg by mouth every 30 (thirty) days.  ? levalbuterol (XOPENEX HFA) 45 MCG/ACT inhaler INHALE 2 PUFFS BY MOUTH EVERY 4 TO 6 HOURS AS NEEDED FOR COUGH OR WHEEZE (Patient taking differently: Inhale 2 puffs into the lungs every 6 (six) hours as needed for wheezing or shortness of breath. INHALE 2 PUFFS BY MOUTH EVERY 4 TO 6 HOURS AS NEEDED FOR COUGH OR WHEEZE)  ? levothyroxine (SYNTHROID, LEVOTHROID) 112 MCG tablet Take 112 mcg by mouth daily before breakfast.   ? loratadine (CLARITIN) 10 MG tablet Take 10 mg by mouth daily.  ? omeprazole (PRILOSEC) 40 MG capsule 1 capsule 1-2 times per day (Patient taking differently: Take 40 mg by mouth daily. 1 capsule 1-2  times per day)  ? sacubitril-valsartan (ENTRESTO) 24-26 MG Take 1 tablet by mouth 2 (two) times daily.  ? simvastatin (ZOCOR) 20 MG tablet Take 20 mg by mouth at bedtime.   ? [DISCONTINUED] olmesartan (BENICAR) 20 MG tablet Take 20 mg by mouth daily.  ?  ? ?Allergies:   Avelox [moxifloxacin hcl in nacl], Albuterol, Anastrozole, Atorvastatin, Ciclesonide, Ezetimibe, Metronidazole, and Sulfa antibiotics  ? ?Social History  ? ?Socioeconomic History  ? Marital status: Married  ?  Spouse name: Not on file  ? Number of children: Not on file  ? Years of education: Not on file  ? Highest education level: Not on file  ?Occupational History  ?  Not on file  ?Tobacco Use  ? Smoking status: Never  ? Smokeless tobacco: Never  ?Vaping Use  ? Vaping Use: Never used  ?Substance and Sexual Activity  ? Alcohol use: No  ? Drug use: No  ? Sexual activity: Not Currently  ?Other Topics Concern  ? Not on file  ?Social History Narrative  ? Not on file  ? ?Social Determinants of Health  ? ?Financial Resource Strain: Not on file  ?Food Insecurity: Not on file  ?Transportation Needs: Not on file  ?Physical Activity: Not on file  ?Stress: Not on file  ?Social Connections: Not on file  ?  ? ?Family History: ?The patient's family history includes Allergies in her son. There is no history of Allergic rhinitis, Angioedema, Asthma, Eczema, Immunodeficiency, or Urticaria. ?ROS:   ?Please see the history of present illness.    ?All 14 point review of systems negative except as described per history of present illness ? ?EKGs/Labs/Other Studies Reviewed:   ? ? ? ?Recent Labs: ?12/13/2021: ALT 14; BUN 15; Creatinine, Ser 0.67; NT-Pro BNP 221; Potassium 4.4; Sodium 141; TSH 2.990  ?Recent Lipid Panel ?No results found for: CHOL, TRIG, HDL, CHOLHDL, VLDL, LDLCALC, LDLDIRECT ? ?Physical Exam:   ? ?VS:  BP 120/72 (BP Location: Right Arm, Patient Position: Sitting)   Pulse 84   Ht 5' 2.5" (1.588 m)   Wt 188 lb (85.3 kg)   SpO2 91%   BMI 33.84 kg/m?    ? ?Wt Readings from Last 3 Encounters:  ?01/02/22 188 lb (85.3 kg)  ?12/13/21 195 lb 3.2 oz (88.5 kg)  ?11/21/21 195 lb 6.4 oz (88.6 kg)  ?  ? ?GEN:  Well nourished, well developed in no acute distress ?HEENT: Normal ?NECK: No JVD; No carotid bruits ?LYMPHATICS: No lymphadenopathy ?CARDIAC: RRR, no murmurs, no rubs, no gallops ?RESPIRATORY:  Clear to auscultation without rales, wheezing or rhonchi  ?ABDOMEN: Soft, non-tender, non-distended ?MUSCULOSKELETAL:  No edema; No deformity  ?SKIN: Warm and dry ?LOWER EXTREMITIES: no swelling ?NEUROLOGIC:  Alert and oriented x 3 ?PSYCHIATRIC:  Normal affect  ? ?ASSESSMENT:   ? ?1. Dilated  cardiomyopathy ([place])   ?2. Essential hypertension   ?3. Hardening of the aorta (main artery of the heart) ([place])   ?4. Ventricular premature depolarization   ?5. Mixed hyperlipidemia   ?6. Acquired hypothyroidism   ? ?PLAN:   ? ?In order of problems listed above: ? ?Dilated cardiomyopathy.  Compensated today.  Time to put her on appropriate medications.  I will discontinue Benicar and start Entresto 2426 twice daily, week from I will check her Chem-7.  If Chem-7 is fine we will double the dose of Entresto.  She is very conscious about weight management she is very conscious about her diet and also trying to stick with low-salt diet. ?  Essential hypertension blood pressure well controlled continue present management. ?PVCs denies have any sensation I am afraid to put her on beta-blocker because of history of asthma but in the future we will try. ? ? ?Medication Adjustments/Labs and Tests Ordered: ?Current medicines are reviewed at length with the patient today.  Concerns regarding medicines are outlined above.  ?Orders Placed This Encounter  ?Procedures  ? Basic metabolic panel  ? ?Medication changes:  ?Meds ordered this encounter  ?Medications  ? sacubitril-valsartan (ENTRESTO) 24-26 MG  ?  Sig: Take 1 tablet by mouth 2 (two) times daily.  ?  Dispense:  60 tablet  ?  Refill:  5  ? ? ?Signed, ?Park Liter, MD, [facility] ?01/02/2022 8:51 AM    ?[place] ? ?

## 2022-01-02 NOTE — Patient Instructions (Addendum)
Medication Instructions:  ?Your physician has recommended you make the following change in your medication:  ?Discontinue Benicar ?Start Entresto 24-26 two times daily ? ?*If you need a refill on your cardiac medications before your next appointment, please call your pharmacy* ? ? ?Lab Work: ?Your physician recommends that you return for lab work in: 1 week for a Basic Metabolic Panel ? ?If you have labs (blood work) drawn today and your tests are completely normal, you will receive your results only by: ?MyChart Message (if you have MyChart) OR ?A paper copy in the mail ?If you have any lab test that is abnormal or we need to change your treatment, we will call you to review the results. ? ? ?Testing/Procedures: ?NONE ? ? ?Follow-Up: ?At [facility], you and your health needs are our priority.  As part of our continuing mission to provide you with exceptional heart care, we have created designated Provider Care Teams.  These Care Teams include your primary Cardiologist (physician) and Advanced Practice Providers (APPs -  Physician Assistants and Nurse Practitioners) who all work together to provide you with the care you need, when you need it. ? ?We recommend signing up for the patient portal called "MyChart".  Sign up information is provided on this After Visit Summary.  MyChart is used to connect with patients for Virtual Visits (Telemedicine).  Patients are able to view lab/test results, encounter notes, upcoming appointments, etc.  Non-urgent messages can be sent to your provider as well.   ?To learn more about what you can do with MyChart, go to [email or web address].   ? ?Your next appointment:   ?1 month(s) ? ?The format for your next appointment:   ?In Person ? ?Provider:   ?Jenne Campus, MD  ? ? ?Other Instructions ? Cooking With Less Salt ?Cooking with less salt is one way to reduce the amount of sodium you get from food. Sodium is one of the elements that make up salt. It is found naturally in  foods and is also added to certain foods. Depending on your condition and overall health, your health care provider or dietitian may recommend that you reduce your sodium intake. Most people should have less than 2,300 milligrams (mg) of sodium each day. If you have high blood pressure (hypertension), you may need to limit your sodium to 1,500 mg each day. Follow the tips below to help reduce your sodium intake. ?What are tips for eating less sodium? ?Reading food labels ? ?Check the food label before buying or using packaged ingredients. Always check the label for the serving size and sodium content. ?Look for products with no more than 140 mg of sodium in one serving. ?Check the % Daily Value column to see what percent of the daily recommended amount of sodium is provided in one serving of the product. Foods with 5% or less in this column are considered low in sodium. Foods with 20% or higher are considered high in sodium. ?Do not choose foods with salt as one of the first three ingredients on the ingredients list. If salt is one of the first three ingredients, it usually means the item is high in sodium. ?Shopping ?Buy sodium-free or low-sodium products. Look for the following words on food labels: ?Low-sodium. ?Sodium-free. ?Reduced-sodium. ?No salt added. ?Unsalted. ?Always check the sodium content even if foods are labeled as low-sodium or no salt added. ?Buy fresh foods. ?Cooking ?Use herbs, seasonings without salt, and spices as substitutes for salt. ?Use sodium-free baking soda when baking. ?  Grill, braise, or roast foods to add flavor with less salt. ?Avoid adding salt to pasta, rice, or hot cereals. ?Drain and rinse canned vegetables, beans, and meat before use. ?Avoid adding salt when cooking sweets and desserts. ?Cook with low-sodium ingredients. ?What foods are high in sodium? ?Vegetables ?Regular canned vegetables (not low-sodium or reduced-sodium). Sauerkraut, pickled vegetables, and relishes. Olives.  [place] fries. Onion rings. Regular canned tomato sauce and paste. Regular tomato and vegetable juice. Frozen vegetables in sauces. ?Grains ?Instant hot cereals. Bread stuffing, pancake, and biscuit mixes. Croutons. Seasoned rice or pasta mixes. Noodle soup cups. Boxed or frozen macaroni and cheese. Regular salted crackers. Self-rising flour. Rolls. Bagels. Flour tortillas and wraps. ?Meats and other proteins ?Meat or fish that is salted, canned, smoked, cured, spiced, or pickled. This includes bacon, ham, sausages, hot dogs, corned beef, chipped beef, meat loaves, salt pork, jerky, pickled herring, anchovies, regular canned tuna, and sardines. Salted nuts. ?Dairy ?Processed cheese and cheese spreads. Cheese curds. Blue cheese. Feta cheese. String cheese. Regular cottage cheese. Buttermilk. Canned milk. ?The items listed above may not be a complete list of foods high in sodium. Actual amounts of sodium may be different depending on processing. Contact a dietitian for more information. ?What foods are low in sodium? ?Fruits ?Fresh, frozen, or canned fruit with no sauce added. Fruit juice. ?Vegetables ?Fresh or frozen vegetables with no sauce added. "No salt added" canned vegetables. "No salt added" tomato sauce and paste. Low-sodium or reduced-sodium tomato and vegetable juice. ?Grains ?Noodles, pasta, quinoa, rice. Shredded or puffed wheat or puffed rice. Regular or quick oats (not instant). Low-sodium crackers. Low-sodium bread. Whole-grain bread and whole-grain pasta. Unsalted popcorn. ?Meats and other proteins ?Fresh or frozen whole meats, poultry (not injected with sodium), and fish with no sauce added. Unsalted nuts. Dried peas, beans, and lentils without added salt. Unsalted canned beans. Eggs. Unsalted nut butters. Low-sodium canned tuna or chicken. ?Dairy ?Milk. Soy milk. Yogurt. Low-sodium cheeses, such as Swiss, Monte[place] Jack, [place], and [employer]. Sherbet or ice cream (keep to ? cup per serving). Cream  cheese. ?Fats and oils ?Unsalted butter or margarine. ?Other foods ?Homemade pudding. Sodium-free baking soda and baking powder. Herbs and spices. Low-sodium seasoning mixes. ?Beverages ?Coffee and tea. Carbonated beverages. ?The items listed above may not be a complete list of foods low in sodium. Actual amounts of sodium may be different depending on processing. Contact a dietitian for more information. ?What are some salt alternatives when cooking? ?The following are herbs, seasonings, and spices that can be used instead of salt to flavor your food. Herbs should be fresh or dried. Do not choose packaged mixes. Next to the name of the herb, spice, or seasoning are some examples of foods you can pair it with. ?Herbs ?Bay leaves - Soups, meat and vegetable dishes, and spaghetti sauce. ?Basil - [employer], soups, pasta, and fish dishes. ?Cilantro - Meat, poultry, and vegetable dishes. ?Chili powder - Marinades and Mexican dishes. ?Chives - Salad dressings and potato dishes. ?Cumin - Mexican dishes, couscous, and meat dishes. ?Dill - Fish dishes, sauces, and salads. ?Fennel - Meat and vegetable dishes, breads, and cookies. ?Garlic (do not use garlic salt) - [place] dishes, meat dishes, salad dressings, and sauces. ?Marjoram - Soups, potato dishes, and meat dishes. ?Oregano - Pizza and spaghetti sauce. ?Parsley - Salads, soups, pasta, and meat dishes. ?Rosemary - [place] dishes, salad dressings, soups, and red meats. ?Saffron - Fish dishes, pasta, and some poultry dishes. ?Sage - Stuffings and sauces. ?Tarragon -  Fish and poultry dishes. ?Thyme - Stuffing, meat, and fish dishes. ?Seasonings ?Lemon juice - Fish dishes, poultry dishes, vegetables, and salads. ?Vinegar - Salad dressings, vegetables, and fish dishes. ?Spices ?Cinnamon - Sweet dishes, such as cakes, cookies, and puddings. ?Cloves - Gingerbread, puddings, and marinades for meats. ?Curry - Vegetable dishes, fish and poultry dishes, and stir-fry  dishes. ?Ginger - Vegetable dishes, fish dishes, and stir-fry dishes. ?Nutmeg - Pasta, vegetables, poultry, fish dishes, and custard. ?Summary ?Cooking with less salt is one way to reduce the amount of sodium t

## 2022-01-07 ENCOUNTER — Other Ambulatory Visit: Payer: Self-pay

## 2022-01-07 ENCOUNTER — Ambulatory Visit: Payer: PPO | Admitting: Allergy and Immunology

## 2022-01-07 ENCOUNTER — Encounter: Payer: Self-pay | Admitting: Allergy and Immunology

## 2022-01-07 VITALS — BP 124/62 | HR 98 | Resp 16

## 2022-01-07 DIAGNOSIS — K219 Gastro-esophageal reflux disease without esophagitis: Secondary | ICD-10-CM | POA: Diagnosis not present

## 2022-01-07 DIAGNOSIS — J3089 Other allergic rhinitis: Secondary | ICD-10-CM

## 2022-01-07 DIAGNOSIS — I420 Dilated cardiomyopathy: Secondary | ICD-10-CM

## 2022-01-07 DIAGNOSIS — J4550 Severe persistent asthma, uncomplicated: Secondary | ICD-10-CM

## 2022-01-07 NOTE — Progress Notes (Signed)
? ?[place] ? ? ?Follow-up Note ? ?Referring Provider: Crist Infante, MD ?Primary Provider: Crist Infante, MD ?Date of Office Visit: 01/07/2022 ? ?Subjective:  ? ?Allison Clark (DOB: [birth year]) is a 71 y.o. female who returns to the [facility] on 01/07/2022 in re-evaluation of the following: ? ?HPI: Morayma returns to this clinic in evaluation of asthma, allergic rhinitis, LPR, and recently diagnosed dilated cardiomyopathy with exercise-induced hypoxemia.  I last saw her in this clinic on 10 December 2021. ? ?During her last visit we documented exercise-induced hypoxemia and obtained an echocardiogram which identified dilated cardiomyopathy with ejection fraction below 30% and referred her to cardiology for further management. ? ?At this point in time she appears to be doing better.  She thinks that she has a little bit more exercise capacity before she gives out.  She has no need to use a short acting bronchodilator.  She continues to use inhaled steroids and her benralizumab injections to address her respiratory tract inflammatory condition. ? ?She is being evaluated and treated regarding her dilated cardiomyopathy with cardiology and she started Entresto 5 days ago. ? ?Allergies as of 01/07/2022   ? ?   Reactions  ? Avelox [moxifloxacin Hcl In Nacl] Hives  ? Albuterol Other (See Comments)  ? Anastrozole Other (See Comments)  ? Atorvastatin Other (See Comments)  ? Ciclesonide Other (See Comments)  ? Ezetimibe Other (See Comments)  ? Metronidazole Other (See Comments)  ? Sulfa Antibiotics Rash  ? ?  ? ?  ?Medication List  ? ? ?Alvesco 160 MCG/ACT inhaler ?Generic drug: ciclesonide ?Inhale one puff with spacer twice daily to prevent cough or wheeze.  Rinse, gargle, and spit after use. ?  ?Entresto 24-26 MG ?Generic drug: sacubitril-valsartan ?Take 1 tablet by mouth 2 (two) times daily. ?  ?EPINEPHrine 0.3 mg/0.3 mL Soaj injection ?Commonly known as:  EPI-PEN ?Use as directed for life threatening allergic reactions ?  ?Fasenra 30 MG/ML Sosy ?Generic drug: Benralizumab ?Inject 1 mL (30 mg total) into the skin every 8 (eight) weeks. ?  ?fluticasone 50 MCG/ACT nasal spray ?Commonly known as: FLONASE ?Place 1 spray into both nostrils daily. ?  ?furosemide 20 MG tablet ?Commonly known as: Lasix ?Take one tablet once daily once daily ?  ?ibandronate 150 MG tablet ?Commonly known as: BONIVA ?Take 150 mg by mouth every 30 (thirty) days. ?  ?levalbuterol 45 MCG/ACT inhaler ?Commonly known as: XOPENEX HFA ?INHALE 2 PUFFS BY MOUTH EVERY 4 TO 6 HOURS AS NEEDED FOR COUGH OR WHEEZE ?  ?levothyroxine 112 MCG tablet ?Commonly known as: SYNTHROID ?Take 112 mcg by mouth daily before breakfast. ?  ?loratadine 10 MG tablet ?Commonly known as: CLARITIN ?Take 10 mg by mouth daily. ?  ?omeprazole 40 MG capsule ?Commonly known as: PRILOSEC ?1 capsule 1-2 times per day ?  ?simvastatin 20 MG tablet ?Commonly known as: ZOCOR ?Take 20 mg by mouth at bedtime. ?  ?VITAMIN D3 PO ?Take 1 tablet by mouth daily. ?  ? ?Past Medical History:  ?Diagnosis Date  ? Asthma   ? BBB (bundle branch block)   ? left  ? Breast cancer ([place]) 2004  ? Left Breast Cancer  ? GERD (gastroesophageal reflux disease)   ? Thyroid disease   ? ? ?Past Surgical History:  ?Procedure Laterality Date  ? BREAST LUMPECTOMY Left 2004  ? malignant  ? BREAST SURGERY    ? STERILIZATION    ? THYROIDECTOMY    ?  TONSILLECTOMY    ? TUBAL LIGATION    ? ? ?Review of systems negative except as noted in HPI / PMHx or noted below: ? ?Review of Systems  ?Constitutional: Negative.   ?HENT: Negative.    ?Eyes: Negative.   ?Respiratory: Negative.    ?Cardiovascular: Negative.   ?Gastrointestinal: Negative.   ?Genitourinary: Negative.   ?Musculoskeletal: Negative.   ?Skin: Negative.   ?Neurological: Negative.   ?Endo/Heme/Allergies: Negative.   ?Psychiatric/Behavioral: Negative.    ? ? ?Objective:  ? ?Vitals:  ? 01/07/22 1105  ?BP: 124/62   ?Pulse: 98  ?Resp: 16  ?SpO2: 95%  ? ?   ?   ? ?Physical Exam ?Constitutional:   ?   Appearance: She is not diaphoretic.  ?HENT:  ?   Head: Normocephalic.  ?   Right Ear: Tympanic membrane, ear canal and external ear normal.  ?   Left Ear: Tympanic membrane, ear canal and external ear normal.  ?   Nose: Nose normal. No mucosal edema or rhinorrhea.  ?   Mouth/Throat:  ?   Pharynx: Uvula midline. No oropharyngeal exudate.  ?   Comments: Small 2 mm in diameter semipedunculated lesion right soft palate. ?Eyes:  ?   Conjunctiva/sclera: Conjunctivae normal.  ?Neck:  ?   Thyroid: No thyromegaly.  ?   Trachea: Trachea normal. No tracheal tenderness or tracheal deviation.  ?Cardiovascular:  ?   Rate and Rhythm: Normal rate and regular rhythm.  ?   Heart sounds: Normal heart sounds, S1 normal and S2 normal. No murmur heard. ?Pulmonary:  ?   Effort: No respiratory distress.  ?   Breath sounds: Normal breath sounds. No stridor. No wheezing or rales.  ?Lymphadenopathy:  ?   Head:  ?   Right side of head: No tonsillar adenopathy.  ?   Left side of head: No tonsillar adenopathy.  ?   Cervical: No cervical adenopathy.  ?Skin: ?   Findings: No erythema or rash.  ?   Nails: There is no clubbing.  ?Neurological:  ?   Mental Status: She is alert.  ? ? ?Diagnostics:  ?  ?Spirometry was performed and demonstrated an FEV1 of 1.49 at 73 % of predicted. ? ?Assessment and Plan:  ? ?1. Asthma, severe persistent, well-controlled   ?2. Other allergic rhinitis   ?3. Laryngopharyngeal reflux (LPR)   ?4. Dilated cardiomyopathy ([place])   ? ? ?1.  Treat and prevent inflammation: ? ? A. Benralizumab autoinjector every 8 weeks ? B. Decrease Alvesco 160 to 1 inhalation 1 time per day ? ?2. Continue Omeprazole 40 mg - 1 tablet daily ? ?3. Can restart Flonase - 1 spray each nostril 1-2 times per day during periods of upper airway symptoms ? ?4. Continue Claritin and levalbuterol HFA if needed ? ?5. Return in Summer 2023 or earlier if problem ? ?Cullen is  better regarding her respiratory tract issue and I think there is an opportunity to consolidate some of her respiratory tract medications by decreasing her Alvesco to 1 time per day when she remains on an anti-IL-5 monoclonal antibody.  She will continue with cardiology regarding further management of her dilated cardiomyopathy.  Incidentally, she does appear to have a right soft palate abnormality and I just asked her to keep an eye on that lesion and if it grows then she is obviously going to require further evaluation for that issue.  I will see her back in this clinic in the summer 2023 or earlier if there is a  problem. ? ?[name], MD ?Allergy / Immunology ?[place] ?

## 2022-01-07 NOTE — Patient Instructions (Addendum)
?  1.  Treat and prevent inflammation: ? ? A. Benralizumab autoinjector every 8 weeks ? B. Decrease Alvesco 160 to 1 inhalation 1 time per day ? ?2. Continue Omeprazole 40 mg - 1 tablet daily ? ?3. Can restart Flonase - 1 spray each nostril 1-2 times per day during periods of upper airway symptoms ? ?4. Continue Claritin and levalbuterol HFA if needed ? ?5. Return in Summer 2023 or earlier if problem ? ?  ? ?  ? ?  ? ?  ? ?  ?

## 2022-01-08 ENCOUNTER — Encounter: Payer: Self-pay | Admitting: Allergy and Immunology

## 2022-01-08 NOTE — Addendum Note (Signed)
Addended by: Guy Franco on: 01/08/2022 11:50 AM ? ? Modules accepted: Orders ? ?

## 2022-01-09 DIAGNOSIS — E782 Mixed hyperlipidemia: Secondary | ICD-10-CM | POA: Diagnosis not present

## 2022-01-09 DIAGNOSIS — E039 Hypothyroidism, unspecified: Secondary | ICD-10-CM | POA: Diagnosis not present

## 2022-01-09 DIAGNOSIS — I10 Essential (primary) hypertension: Secondary | ICD-10-CM | POA: Diagnosis not present

## 2022-01-09 DIAGNOSIS — I493 Ventricular premature depolarization: Secondary | ICD-10-CM | POA: Diagnosis not present

## 2022-01-09 DIAGNOSIS — I700 Atherosclerosis of aorta: Secondary | ICD-10-CM | POA: Diagnosis not present

## 2022-01-09 DIAGNOSIS — I420 Dilated cardiomyopathy: Secondary | ICD-10-CM | POA: Diagnosis not present

## 2022-01-10 ENCOUNTER — Telehealth: Payer: Self-pay

## 2022-01-10 DIAGNOSIS — I10 Essential (primary) hypertension: Secondary | ICD-10-CM

## 2022-01-10 LAB — BASIC METABOLIC PANEL
BUN/Creatinine Ratio: 23 (ref 12–28)
BUN: 19 mg/dL (ref 8–27)
CO2: 25 mmol/L (ref 20–29)
Calcium: 9.7 mg/dL (ref 8.7–10.3)
Chloride: 96 mmol/L (ref 96–106)
Creatinine, Ser: 0.83 mg/dL (ref 0.57–1.00)
Glucose: 89 mg/dL (ref 70–99)
Potassium: 4.3 mmol/L (ref 3.5–5.2)
Sodium: 139 mmol/L (ref 134–144)
eGFR: 75 mL/min/{1.73_m2} (ref >59)

## 2022-01-10 MED ORDER — ENTRESTO 49-51 MG PO TABS: 1.0000 | ORAL_TABLET | Freq: Two times a day (BID) | ORAL | 1 refills | Status: DC

## 2022-01-10 NOTE — Telephone Encounter (Signed)
Patient aware of results and recommendations and agree with plan. Rx sent and lab order on file.  ?

## 2022-01-10 NOTE — Telephone Encounter (Signed)
-----   Message from Park Liter, MD sent at 01/10/2022 11:15 AM EDT ----- ?Double dose of Entresto to 50-48 twice daily, Chem-7 need to be checked next week ?

## 2022-01-17 ENCOUNTER — Other Ambulatory Visit: Payer: Self-pay

## 2022-01-17 DIAGNOSIS — I10 Essential (primary) hypertension: Secondary | ICD-10-CM | POA: Diagnosis not present

## 2022-01-23 ENCOUNTER — Encounter: Payer: Self-pay | Admitting: Cardiology

## 2022-01-30 LAB — BASIC METABOLIC PANEL
BUN/Creatinine Ratio: 20 (ref 12–28)
BUN: 16 mg/dL (ref 8–27)
Calcium: 9.3 mg/dL (ref 8.7–10.3)
Chloride: 99 mmol/L (ref 96–106)
Creatinine, Ser: 0.79 mg/dL (ref 0.57–1.00)
Glucose: 97 mg/dL (ref 70–99)
Potassium: 4.1 mmol/L (ref 3.5–5.2)
Sodium: 139 mmol/L (ref 134–144)
eGFR: 80 mL/min/{1.73_m2} (ref >59)

## 2022-02-05 ENCOUNTER — Ambulatory Visit: Payer: PPO | Admitting: Cardiology

## 2022-02-05 VITALS — BP 132/68 | HR 84 | Ht 63.0 in | Wt 181.6 lb

## 2022-02-05 DIAGNOSIS — I493 Ventricular premature depolarization: Secondary | ICD-10-CM | POA: Diagnosis not present

## 2022-02-05 DIAGNOSIS — I10 Essential (primary) hypertension: Secondary | ICD-10-CM | POA: Diagnosis not present

## 2022-02-05 DIAGNOSIS — I420 Dilated cardiomyopathy: Secondary | ICD-10-CM

## 2022-02-05 NOTE — Progress Notes (Signed)
?Cardiology Office Note:   ? ?Date:  02/05/2022  ? ?ID:  Allison Clark, DOB Feb 13, 1951, MRN [id number] ? ?PCP:  Crist Infante, MD  ?Cardiologist:  Jenne Campus, MD   ? ?Referring MD: Crist Infante, MD  ? ?No chief complaint on file. ?I am doing fine perfectly asymptomatic now ? ?History of Present Illness:   ? ?Allison Clark is a 71 y.o. female with past medical history significant for bundle branch block recently recognized cardiomyopathy ejection fraction 30%.  She did have a calcium score done which showed score of 37.6 in LAD, therefore, I doubt very much significant severe triple-vessel disease.  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  We will do general she is doing much better.  She denies have any chest pain tightness squeezing pressure burning chest no swelling can walk climb stairs with no difficulties.  Overall hemodynamically stable her New York Heart Association class II ? ?Past Medical History:  ?Diagnosis Date  ? Asthma   ? BBB (bundle branch block)   ? left  ? Breast cancer ([place]) 2004  ? Left Breast Cancer  ? GERD (gastroesophageal reflux disease)   ? Thyroid disease   ? ? ?Past Surgical History:  ?Procedure Laterality Date  ? BREAST LUMPECTOMY Left 2004  ? malignant  ? BREAST SURGERY    ? STERILIZATION    ? THYROIDECTOMY    ? TONSILLECTOMY    ? TUBAL LIGATION    ? ? ?Current Medications: ?No outpatient medications have been marked as taking for the 02/05/22 encounter (Office Visit) with Park Liter, MD.  ?  ? ?Allergies:   Avelox [moxifloxacin hcl in nacl], Albuterol, Anastrozole, Atorvastatin, Ciclesonide, Ezetimibe, Metronidazole, and Sulfa antibiotics  ? ?Social History  ? ?Socioeconomic History  ? Marital status: Married  ?  Spouse name: Not on file  ? Number of children: Not on file   ? Years of education: Not on file  ? Highest education level: Not on file  ?Occupational History  ? Not on file  ?Tobacco Use  ? Smoking status: Never  ? Smokeless tobacco: Never  ?Vaping Use  ? Vaping Use: Never used  ?Substance and Sexual Activity  ? Alcohol use: No  ? Drug use: No  ? Sexual activity: Not Currently  ?Other Topics Concern  ? Not on file  ?Social History Narrative  ? Not on file  ? ?Social Determinants of Health  ? ?Financial Resource Strain: Not on file  ?Food Insecurity: Not on file  ?Transportation Needs: Not on file  ?Physical Activity: Not on file  ?Stress: Not on file  ?Social Connections: Not on file  ?  ? ?Family History: ?The patient's family history includes Allergies in her son. There is no history of Allergic rhinitis, Angioedema, Asthma, Eczema, Immunodeficiency, or Urticaria. ?ROS:   ?Please see the history of present illness.    ?All 14 point review of systems negative except as described per history of present illness ? ?EKGs/Labs/Other Studies Reviewed:   ? ? ? ?Recent Labs: ?12/13/2021: ALT 14; NT-Pro BNP 221; TSH 2.990 ?01/17/2022: BUN 16; Creatinine, Ser 0.79; Potassium 4.1; Sodium 139  ?Recent Lipid Panel ?No results found for: CHOL, TRIG, HDL, CHOLHDL, VLDL, LDLCALC, LDLDIRECT ? ?Physical Exam:   ? ?VS:  BP 132/68 (BP Location: Right Arm, Patient  Position: Sitting)   Pulse 84   Ht '5\' 3"'$  (1.6 m)   Wt 181 lb 9.6 oz (82.4 kg)   SpO2 94%   BMI 32.17 kg/m?    ? ?Wt Readings from Last 3 Encounters:  ?02/05/22 181 lb 9.6 oz (82.4 kg)  ?01/02/22 188 lb (85.3 kg)  ?12/13/21 195 lb 3.2 oz (88.5 kg)  ?  ? ?GEN:  Well nourished, well developed in no acute distress ?HEENT: Normal ?NECK: No JVD; No carotid bruits ?LYMPHATICS: No lymphadenopathy ?CARDIAC: RRR, no murmurs, no rubs, no gallops ?RESPIRATORY:  Clear to auscultation without rales, wheezing or rhonchi  ?ABDOMEN: Soft, non-tender, non-distended ?MUSCULOSKELETAL:  No edema; No deformity  ?SKIN: Warm and dry ?LOWER EXTREMITIES: no  swelling ?NEUROLOGIC:  Alert and oriented x 3 ?PSYCHIATRIC:  Normal affect  ? ?ASSESSMENT:   ? ?1. Dilated cardiomyopathy ([place])   ?2. Ventricular premature depolarization   ?3. Essential hypertension   ? ?PLAN:   ? ?In order of problems listed above: ? ?Cardiomyopathy dilated could be related to chemotherapy that she was getting for breast cancer however that happened more than 20 years ago.  She is on Entresto I am reluctant to put her on beta-blocker because of history of bronchial asthma however she is not wheezing today I will check Chem-7 if Chem-7 is fine we will put her on Aldactone if Chem-7 is not okay meaning his potassium is on the higher side like it was last time when it was 4.3.  I will try to put him on a small dose of beta-blocker.  I will see her back in my office in about 2 months ?PVCs denies having any palpitations doing well from that point review ?Essential hypertension better control hopefully will be well controlled with addition of the medications ? ? ?Medication Adjustments/Labs and Tests Ordered: ?Current medicines are reviewed at length with the patient today.  Concerns regarding medicines are outlined above.  ?No orders of the defined types were placed in this encounter. ? ?Medication changes: No orders of the defined types were placed in this encounter. ? ? ?Signed, ?Park Liter, MD, [facility] ?02/05/2022 1:24 PM    ?[place] ?

## 2022-02-05 NOTE — Addendum Note (Signed)
Addended by: Jacobo Forest D on: 02/05/2022 01:32 PM ? ? Modules accepted: Orders ? ?

## 2022-02-05 NOTE — Patient Instructions (Signed)
Medication Instructions:  Your physician recommends that you continue on your current medications as directed. Please refer to the Current Medication list given to you today.  *If you need a refill on your cardiac medications before your next appointment, please call your pharmacy*   Lab Work: BMP- today If you have labs (blood work) drawn today and your tests are completely normal, you will receive your results only by: MyChart Message (if you have MyChart) OR A paper copy in the mail If you have any lab test that is abnormal or we need to change your treatment, we will call you to review the results.   Testing/Procedures: None Ordered   Follow-Up: At [facility], you and your health needs are our priority.  As part of our continuing mission to provide you with exceptional heart care, we have created designated Provider Care Teams.  These Care Teams include your primary Cardiologist (physician) and Advanced Practice Providers (APPs -  Physician Assistants and Nurse Practitioners) who all work together to provide you with the care you need, when you need it.  We recommend signing up for the patient portal called "MyChart".  Sign up information is provided on this After Visit Summary.  MyChart is used to connect with patients for Virtual Visits (Telemedicine).  Patients are able to view lab/test results, encounter notes, upcoming appointments, etc.  Non-urgent messages can be sent to your provider as well.   To learn more about what you can do with MyChart, go to [email or web address].    Your next appointment:   2 month(s)  The format for your next appointment:   In Person  Provider:   Robert Krasowski, MD    Other Instructions NA  

## 2022-02-06 LAB — BASIC METABOLIC PANEL
BUN/Creatinine Ratio: 30 — ABNORMAL HIGH (ref 12–28)
BUN: 20 mg/dL (ref 8–27)
CO2: 24 mmol/L (ref 20–29)
Calcium: 9.5 mg/dL (ref 8.7–10.3)
Chloride: 99 mmol/L (ref 96–106)
Creatinine, Ser: 0.67 mg/dL (ref 0.57–1.00)
Glucose: 91 mg/dL (ref 70–99)
Potassium: 4.4 mmol/L (ref 3.5–5.2)
Sodium: 138 mmol/L (ref 134–144)
eGFR: 93 mL/min/{1.73_m2} (ref >59)

## 2022-02-07 ENCOUNTER — Telehealth: Payer: Self-pay

## 2022-02-07 MED ORDER — METOPROLOL TARTRATE 25 MG PO TABS: 25.0000 mg | ORAL_TABLET | Freq: Two times a day (BID) | ORAL | 3 refills | Status: DC

## 2022-02-07 NOTE — Telephone Encounter (Signed)
-----   Message from Park Liter, MD sent at 02/07/2022 10:56 AM EDT ----- ?Lets try to add 25 mg metoprolol titrate twice daily please ?

## 2022-02-07 NOTE — Telephone Encounter (Signed)
Metoprolol 25 BID sent to Pharmacy ?

## 2022-02-15 ENCOUNTER — Other Ambulatory Visit: Payer: Self-pay | Admitting: *Deleted

## 2022-02-15 MED ORDER — FASENRA PEN 30 MG/ML ~~LOC~~ SOAJ: 30.0000 mg | SUBCUTANEOUS | 6 refills | Status: DC

## 2022-03-06 ENCOUNTER — Other Ambulatory Visit: Payer: Self-pay | Admitting: Obstetrics and Gynecology

## 2022-03-06 DIAGNOSIS — Z1231 Encounter for screening mammogram for malignant neoplasm of breast: Secondary | ICD-10-CM

## 2022-03-14 ENCOUNTER — Other Ambulatory Visit: Payer: Self-pay

## 2022-03-14 MED ORDER — ENTRESTO 49-51 MG PO TABS: 1.0000 | ORAL_TABLET | Freq: Two times a day (BID) | ORAL | 10 refills | Status: DC

## 2022-03-29 ENCOUNTER — Ambulatory Visit
Admission: RE | Admit: 2022-03-29 | Discharge: 2022-03-29 | Disposition: A | Discharge status: Home / Self Care | Payer: PPO | Source: Ambulatory Visit | Attending: Obstetrics and Gynecology | Admitting: Obstetrics and Gynecology

## 2022-03-29 DIAGNOSIS — Z1231 Encounter for screening mammogram for malignant neoplasm of breast: Secondary | ICD-10-CM | POA: Diagnosis not present

## 2022-04-01 ENCOUNTER — Telehealth: Payer: Self-pay | Admitting: Allergy and Immunology

## 2022-04-01 NOTE — Telephone Encounter (Signed)
Dr. Kozlow please advice. Thank you 

## 2022-04-01 NOTE — Telephone Encounter (Signed)
Patient states her pharmacy can no longer order the Alvesco. She has tried calling other [facility] but they are not able to order either. She is wondering if she needs to have an alternative called in or if she can just wait until her next OV in July. She has enough to hold her until then if its best for her to wait so she doesn't have to change medications.

## 2022-04-03 NOTE — Telephone Encounter (Signed)
Patient informed. 

## 2022-04-08 ENCOUNTER — Ambulatory Visit: Payer: PPO | Admitting: Cardiology

## 2022-04-08 ENCOUNTER — Encounter: Payer: Self-pay | Admitting: Cardiology

## 2022-04-08 VITALS — BP 134/70 | HR 67 | Ht 62.5 in | Wt 177.6 lb

## 2022-04-08 DIAGNOSIS — I10 Essential (primary) hypertension: Secondary | ICD-10-CM | POA: Diagnosis not present

## 2022-04-08 DIAGNOSIS — J4531 Mild persistent asthma with (acute) exacerbation: Secondary | ICD-10-CM

## 2022-04-08 DIAGNOSIS — R0789 Other chest pain: Secondary | ICD-10-CM | POA: Diagnosis not present

## 2022-04-08 DIAGNOSIS — I420 Dilated cardiomyopathy: Secondary | ICD-10-CM

## 2022-04-08 DIAGNOSIS — R072 Precordial pain: Secondary | ICD-10-CM

## 2022-04-08 HISTORY — DX: Other chest pain: R07.89

## 2022-04-08 MED ORDER — METOPROLOL SUCCINATE ER 50 MG PO TB24
50.0000 mg | ORAL_TABLET | Freq: Every day | ORAL | 0 refills | Status: DC
  Filled 2023-01-07 – 2023-01-08 (×2): qty 90, 90d supply, fill #0

## 2022-04-08 NOTE — Progress Notes (Signed)
Cardiology Office Note:    Date:  04/08/2022   ID:  Allison Clark, DOB 12-Jan-1951, MRN [id number]  PCP:  Crist Infante, MD  Cardiologist:  Jenne Campus, MD    Referring MD: Crist Infante, MD   Chief Complaint  Patient presents with   Follow-up  Doing well  History of Present Illness:    Allison Clark is a 71 y.o. female with past medical history significant for cardiomyopathy ejection fraction 30%, left bundle branch block, she did have a calcium score done in 2021 which was 37.6 only LAD therefore we doubt presence of significant severe coronary artery disease however today she comes from my office to follow-up doing well described to have some shortness of breath only with extreme exertion.  Described a few episode of chest pain which is obviously concerning.  No swelling of lower extremities her New York Heart Association class II.  Past Medical History:  Diagnosis Date   Asthma    BBB (bundle branch block)    left   Breast cancer ([place]) 2004   Left Breast Cancer   GERD (gastroesophageal reflux disease)    Thyroid disease     Past Surgical History:  Procedure Laterality Date   BREAST LUMPECTOMY Left 2004   malignant   BREAST SURGERY     STERILIZATION     THYROIDECTOMY     TONSILLECTOMY     TUBAL LIGATION      Current Medications: Current Meds  Medication Sig   ALVESCO 160 MCG/ACT inhaler Inhale one puff with spacer twice daily to prevent cough or wheeze.  Rinse, gargle, and spit after use. (Patient taking differently: Inhale 1 puff into the lungs 2 (two) times daily. Inhale one puff with spacer twice daily to prevent cough or wheeze.  Rinse, gargle, and spit after use.)   Benralizumab (FASENRA PEN) 30 MG/ML SOAJ Inject 1 mL (30 mg total) into the skin every 8 (eight) weeks.   Cholecalciferol (VITAMIN D3 PO) Take 1 tablet by mouth daily.   EPINEPHrine 0.3 mg/0.3 mL IJ SOAJ injection Use as directed for life threatening allergic reactions (Patient taking  differently: Inject 0.3 mg into the muscle as needed for anaphylaxis. Use as directed for life threatening allergic reactions)   fluticasone (FLONASE) 50 MCG/ACT nasal spray Place 1 spray into both nostrils daily.    furosemide (LASIX) 20 MG tablet Take one tablet once daily once daily (Patient taking differently: Take 20 mg by mouth daily. Take one tablet once daily once daily)   ibandronate (BONIVA) 150 MG tablet Take 150 mg by mouth every 30 (thirty) days.   levalbuterol (XOPENEX HFA) 45 MCG/ACT inhaler INHALE 2 PUFFS BY MOUTH EVERY 4 TO 6 HOURS AS NEEDED FOR COUGH OR WHEEZE (Patient taking differently: Inhale 2 puffs into the lungs every 6 (six) hours as needed for wheezing or shortness of breath. INHALE 2 PUFFS BY MOUTH EVERY 4 TO 6 HOURS AS NEEDED FOR COUGH OR WHEEZE)   levothyroxine (SYNTHROID, LEVOTHROID) 112 MCG tablet Take 112 mcg by mouth daily before breakfast.    loratadine (CLARITIN) 10 MG tablet Take 10 mg by mouth daily.   metoprolol tartrate (LOPRESSOR) 25 MG tablet Take 1 tablet (25 mg total) by mouth 2 (two) times daily.   omeprazole (PRILOSEC) 40 MG capsule 1 capsule 1-2 times per day (Patient taking differently: Take 40 mg by mouth daily. 1 capsule 1-2 times per day)   sacubitril-valsartan (ENTRESTO) 49-51 MG Take 1 tablet by mouth 2 (two) times daily.  simvastatin (ZOCOR) 20 MG tablet Take 20 mg by mouth at bedtime.      Allergies:   Avelox [moxifloxacin hcl in nacl], Albuterol, Anastrozole, Atorvastatin, Ciclesonide, Ezetimibe, Metronidazole, and Sulfa antibiotics   Social History   Socioeconomic History   Marital status: Married    Spouse name: Not on file   Number of children: Not on file   Years of education: Not on file   Highest education level: Not on file  Occupational History   Not on file  Tobacco Use   Smoking status: Never   Smokeless tobacco: Never  Vaping Use   Vaping Use: Never used  Substance and Sexual Activity   Alcohol use: No   Drug use: No    Sexual activity: Not Currently  Other Topics Concern   Not on file  Social History Narrative   Not on file   Social Determinants of Health   Financial Resource Strain: Not on file  Food Insecurity: Not on file  Transportation Needs: Not on file  Physical Activity: Not on file  Stress: Not on file  Social Connections: Not on file     Family History: The patient's family history includes Allergies in her son. There is no history of Allergic rhinitis, Angioedema, Asthma, Eczema, Immunodeficiency, Urticaria, or Breast cancer. ROS:   Please see the history of present illness.    All 14 point review of systems negative except as described per history of present illness  EKGs/Labs/Other Studies Reviewed:      Recent Labs: 12/13/2021: ALT 14; NT-Pro BNP 221; TSH 2.990 02/05/2022: BUN 20; Creatinine, Ser 0.67; Potassium 4.4; Sodium 138  Recent Lipid Panel No results found for: "CHOL", "TRIG", "HDL", "CHOLHDL", "VLDL", "LDLCALC", "LDLDIRECT"  Physical Exam:    VS:  BP 134/70 (BP Location: Left Arm, Patient Position: Sitting)   Pulse 67   Ht 5' 2.5" (1.588 m)   Wt 177 lb 9.6 oz (80.6 kg)   SpO2 93%   BMI 31.97 kg/m     Wt Readings from Last 3 Encounters:  04/08/22 177 lb 9.6 oz (80.6 kg)  02/05/22 181 lb 9.6 oz (82.4 kg)  01/02/22 188 lb (85.3 kg)     GEN:  Well nourished, well developed in no acute distress HEENT: Normal NECK: No JVD; No carotid bruits LYMPHATICS: No lymphadenopathy CARDIAC: RRR, no murmurs, no rubs, no gallops RESPIRATORY:  Clear to auscultation without rales, wheezing or rhonchi  ABDOMEN: Soft, non-tender, non-distended MUSCULOSKELETAL:  No edema; No deformity  SKIN: Warm and dry LOWER EXTREMITIES: no swelling NEUROLOGIC:  Alert and oriented x 3 PSYCHIATRIC:  Normal affect   ASSESSMENT:    1. Dilated cardiomyopathy ([place])   2. Essential hypertension   3. Mild persistent asthma with acute exacerbation   4. Atypical chest pain    PLAN:    In  order of problems listed above:  Cardiomyopathy felt to be not ischemic however today she said that she does have some chest pain symptoms.  I will schedule her to have coronary CT angio to rule out significant coronary artery disease Essential hypertension blood pressure well controlled continue present management. Persistent asthma continue follow-up excellently by Dr. Neldon Mc Atypical chest pain we will do coronary CT angio I will check Chem-7 today if Chem-7 is fine then we will add some Aldactone.  I we will also schedule her later to have echocardiogram repeated to recheck left ventricle ejection fraction   Medication Adjustments/Labs and Tests Ordered: Current medicines are reviewed at length with the  patient today.  Concerns regarding medicines are outlined above.  No orders of the defined types were placed in this encounter.  Medication changes: No orders of the defined types were placed in this encounter.   Signed, Park Liter, MD, [facility] 04/08/2022 10:04 AM    [place]

## 2022-04-10 DIAGNOSIS — Z6829 Body mass index (BMI) 29.0-29.9, adult: Secondary | ICD-10-CM | POA: Diagnosis not present

## 2022-04-10 DIAGNOSIS — Z124 Encounter for screening for malignant neoplasm of cervix: Secondary | ICD-10-CM | POA: Diagnosis not present

## 2022-04-10 DIAGNOSIS — Z01419 Encounter for gynecological examination (general) (routine) without abnormal findings: Secondary | ICD-10-CM | POA: Diagnosis not present

## 2022-04-22 DIAGNOSIS — I420 Dilated cardiomyopathy: Secondary | ICD-10-CM | POA: Diagnosis not present

## 2022-04-22 DIAGNOSIS — I10 Essential (primary) hypertension: Secondary | ICD-10-CM | POA: Diagnosis not present

## 2022-04-23 LAB — BASIC METABOLIC PANEL
BUN/Creatinine Ratio: 24 (ref 12–28)
BUN: 20 mg/dL (ref 8–27)
CO2: 25 mmol/L (ref 20–29)
Calcium: 9.6 mg/dL (ref 8.7–10.3)
Chloride: 101 mmol/L (ref 96–106)
Creatinine, Ser: 0.85 mg/dL (ref 0.57–1.00)
Glucose: 87 mg/dL (ref 70–99)
Potassium: 4.8 mmol/L (ref 3.5–5.2)
Sodium: 141 mmol/L (ref 134–144)
eGFR: 73 mL/min/{1.73_m2} (ref >59)

## 2022-04-29 ENCOUNTER — Telehealth (HOSPITAL_COMMUNITY): Payer: Self-pay | Admitting: *Deleted

## 2022-04-29 NOTE — Telephone Encounter (Signed)
Reaching out to patient to offer assistance regarding upcoming cardiac imaging study; pt verbalizes understanding of appt date/time, parking situation and where to check in, pre-test NPO status and medications ordered, and verified current allergies; name and call back number provided for further questions should they arise  Gordy Clement RN Navigator Cardiac Imaging [facility] [phone] office [phone] cell  Patient to take '25mg'$  metoprolol tartrate two hours prior to her cardiac CT scan. She is aware to arrive at 11:30am.

## 2022-04-30 ENCOUNTER — Ambulatory Visit (HOSPITAL_COMMUNITY)
Admission: RE | Admit: 2022-04-30 | Discharge: 2022-04-30 | Disposition: A | Discharge status: Home / Self Care | Payer: PPO | Source: Ambulatory Visit | Attending: Cardiology | Admitting: Cardiology

## 2022-04-30 ENCOUNTER — Telehealth: Payer: Self-pay

## 2022-04-30 DIAGNOSIS — R072 Precordial pain: Secondary | ICD-10-CM | POA: Diagnosis not present

## 2022-04-30 DIAGNOSIS — I420 Dilated cardiomyopathy: Secondary | ICD-10-CM

## 2022-04-30 MED ORDER — SPIRONOLACTONE 25 MG PO TABS
25.0000 mg | ORAL_TABLET | Freq: Every day | ORAL | 4 refills | Status: DC
  Filled 2023-01-07: qty 90, 90d supply, fill #0
  Filled 2023-01-28: qty 30, 30d supply, fill #0
  Filled 2023-03-03: qty 30, 30d supply, fill #1
  Filled 2023-04-08: qty 30, 30d supply, fill #2
  Filled 2023-04-28: qty 30, 30d supply, fill #3

## 2022-04-30 MED ORDER — NITROGLYCERIN 0.4 MG SL SUBL
SUBLINGUAL_TABLET | SUBLINGUAL | Status: AC
  Filled 2022-04-30: qty 2

## 2022-04-30 MED ORDER — IOHEXOL 350 MG/ML SOLN
100.0000 mL | Freq: Once | INTRAVENOUS | Status: AC | PRN
  Administered 2022-04-30: 100 mL via INTRAVENOUS

## 2022-04-30 MED ORDER — NITROGLYCERIN 0.4 MG SL SUBL
0.8000 mg | SUBLINGUAL_TABLET | Freq: Once | SUBLINGUAL | Status: AC
  Administered 2022-04-30: 0.8 mg via SUBLINGUAL

## 2022-04-30 NOTE — Telephone Encounter (Signed)
-----   Message from Park Liter, MD sent at 04/25/2022 12:37 PM EDT ----- Chem-7 looks good, please add Aldactone 12.5 daily, Chem-7 need to be done within next 4 days after that

## 2022-05-02 ENCOUNTER — Telehealth: Payer: Self-pay

## 2022-05-02 ENCOUNTER — Encounter: Payer: Self-pay | Admitting: Cardiology

## 2022-05-02 NOTE — Telephone Encounter (Signed)
Note from Pt: Thank you for calling with lab and ct scan report. My question is what exactly did Dr Raliegh Ip see o labs to prompt give new drug spironolactone, is there something I can do more with diet/exercise at home. I watch my sodium intake very closely. Do I need to talk with a dietitian associated with heart issues? Thank you for your time. Allison Clark 06/30/2051

## 2022-05-06 ENCOUNTER — Encounter: Payer: Self-pay | Admitting: Allergy and Immunology

## 2022-05-06 ENCOUNTER — Ambulatory Visit: Payer: PPO | Admitting: Allergy and Immunology

## 2022-05-06 VITALS — BP 124/72 | HR 63 | Resp 16

## 2022-05-06 DIAGNOSIS — I420 Dilated cardiomyopathy: Secondary | ICD-10-CM

## 2022-05-06 DIAGNOSIS — J3089 Other allergic rhinitis: Secondary | ICD-10-CM

## 2022-05-06 DIAGNOSIS — J4550 Severe persistent asthma, uncomplicated: Secondary | ICD-10-CM

## 2022-05-06 DIAGNOSIS — K219 Gastro-esophageal reflux disease without esophagitis: Secondary | ICD-10-CM | POA: Diagnosis not present

## 2022-05-06 NOTE — Patient Instructions (Addendum)
  1.  Treat and prevent inflammation:   A. Benralizumab autoinjector every 8 weeks  B. Discontinue alvesco  2. Continue Omeprazole 40 mg - 1 tablet daily  3. Can restart Flonase - 1 spray each nostril 1-2 times per day during periods of upper airway symptoms  4. Continue Claritin and levalbuterol HFA if needed  5. Return in 6 months or earlier if problem  6. Obtain fall flu vaccine and RSV vaccine

## 2022-05-06 NOTE — Progress Notes (Unsigned)
[place] [facility] [place] [facility] [place]   Follow-up Note  Referring Provider: Crist Infante, MD Primary Provider: Crist Infante, MD Date of Office Visit: 05/06/2022  Subjective:   Allison Clark (DOB: 02/16/1951) is a 71 y.o. female who returns to the [facility] [place] on 05/06/2022 in re-evaluation of the following:  HPI: Returns to this clinic in evaluation of asthma, allergic rhinitis, LPR.  I last saw her in this clinic on 07 January 2022.  She is really done well with her breathing.  During her last visit she was able to decrease her Alvesco while she remained on benralizumab injections.  She rarely if ever uses the short acting bronchodilator she can exert herself without much problem.  She had very little issues with her nose and has not required an antibiotic to treat an episode of sinusitis while intermittently using a nasal steroid.  Her reflux has been under excellent control with a proton pump inhibitor.  Allergies as of 05/06/2022       Reactions   Avelox [moxifloxacin Hcl In Nacl] Hives   Albuterol Other (See Comments)   Anastrozole Other (See Comments)   Atorvastatin Other (See Comments)   Ciclesonide Other (See Comments)   Ezetimibe Other (See Comments)   Metronidazole Other (See Comments)   Sulfa Antibiotics Rash        Medication List    Alvesco 160 MCG/ACT inhaler Generic drug: ciclesonide Inhale one puff with spacer twice daily to prevent cough or wheeze.  Rinse, gargle, and spit after use.   Entresto 49-51 MG Generic drug: sacubitril-valsartan Take 1 tablet by mouth 2 (two) times daily.   EPINEPHrine 0.3 mg/0.3 mL Soaj injection Commonly known as: EPI-PEN Use as directed for life threatening allergic reactions   Fasenra Pen 30 MG/ML Soaj Generic drug: Benralizumab Inject 1 mL (30 mg total) into the skin every 8 (eight) weeks.   fluticasone 50 MCG/ACT nasal spray Commonly known as: FLONASE Place 1 spray into both  nostrils daily.   furosemide 20 MG tablet Commonly known as: Lasix Take one tablet once daily once daily   ibandronate 150 MG tablet Commonly known as: BONIVA Take 150 mg by mouth every 30 (thirty) days.   levalbuterol 45 MCG/ACT inhaler Commonly known as: XOPENEX HFA INHALE 2 PUFFS BY MOUTH EVERY 4 TO 6 HOURS AS NEEDED FOR COUGH OR WHEEZE   levothyroxine 112 MCG tablet Commonly known as: SYNTHROID Take 112 mcg by mouth daily before breakfast.   loratadine 10 MG tablet Commonly known as: CLARITIN Take 10 mg by mouth daily.   metoprolol succinate 50 MG 24 hr tablet Commonly known as: TOPROL-XL Take 1 tablet (50 mg total) by mouth daily. Take with or immediately following a meal.   omeprazole 40 MG capsule Commonly known as: PRILOSEC 1 capsule 1-2 times per day   simvastatin 20 MG tablet Commonly known as: ZOCOR Take 20 mg by mouth at bedtime.   spironolactone 25 MG tablet Commonly known as: ALDACTONE Take 1 tablet (25 mg total) by mouth daily.   VITAMIN D3 PO Take 1 tablet by mouth daily.     Past Medical History:  Diagnosis Date   Asthma    BBB (bundle branch block)    left   Breast cancer ([place]) 2004   Left Breast Cancer   GERD (gastroesophageal reflux disease)    Thyroid disease     Past Surgical History:  Procedure Laterality Date   BREAST LUMPECTOMY Left 2004  malignant   BREAST SURGERY     STERILIZATION     THYROIDECTOMY     TONSILLECTOMY     TUBAL LIGATION      Review of systems negative except as noted in HPI / PMHx or noted below:  Review of Systems  Constitutional: Negative.   HENT: Negative.    Eyes: Negative.   Respiratory: Negative.    Cardiovascular: Negative.   Gastrointestinal: Negative.   Genitourinary: Negative.   Musculoskeletal: Negative.   Skin: Negative.   Neurological: Negative.   Endo/Heme/Allergies: Negative.   Psychiatric/Behavioral: Negative.       Objective:   Vitals:   05/06/22 1105  BP: 124/72  Pulse:  63  Resp: 16  SpO2: 96%          Physical Exam Constitutional:      Appearance: She is not diaphoretic.  HENT:     Head: Normocephalic.     Right Ear: Tympanic membrane, ear canal and external ear normal.     Left Ear: Tympanic membrane, ear canal and external ear normal.     Nose: Nose normal. No mucosal edema or rhinorrhea.     Mouth/Throat:     Pharynx: Uvula midline. No oropharyngeal exudate.  Eyes:     Conjunctiva/sclera: Conjunctivae normal.  Neck:     Thyroid: No thyromegaly.     Trachea: Trachea normal. No tracheal tenderness or tracheal deviation.  Cardiovascular:     Rate and Rhythm: Normal rate and regular rhythm.     Heart sounds: Normal heart sounds, S1 normal and S2 normal. No murmur heard. Pulmonary:     Effort: No respiratory distress.     Breath sounds: Normal breath sounds. No stridor. No wheezing or rales.  Lymphadenopathy:     Head:     Right side of head: No tonsillar adenopathy.     Left side of head: No tonsillar adenopathy.     Cervical: No cervical adenopathy.  Skin:    Findings: No erythema or rash.     Nails: There is no clubbing.  Neurological:     Mental Status: She is alert.     Diagnostics:    Spirometry was performed and demonstrated an FEV1 of 1.38 at 68 % of predicted.  Results of a chest CT scan obtained 30 April 2022 identified the following:  Cardiovascular: There are no significant extracardiac vascular findings.   Mediastinum/Nodes: There are no enlarged lymph nodes within the visualized mediastinum.   Lungs/Pleura: There is no pleural effusion. Mild linear and ground-glass subsegmental atelectasis and/or scarring within the medial middle lobe, lingula, and bilateral lower lobes, similar to prior.  Assessment and Plan:   1. Asthma, severe persistent, well-controlled   2. Other allergic rhinitis   3. Laryngopharyngeal reflux (LPR)     1.  Treat and prevent inflammation:   A. Benralizumab autoinjector every 8  weeks  B. Discontinue alvesco  2. Continue Omeprazole 40 mg - 1 tablet daily  3. Can restart Flonase - 1 spray each nostril 1-2 times per day during periods of upper airway symptoms  4. Continue Claritin and levalbuterol HFA if needed  5. Return in 6 months or earlier if problem  6. Obtain fall flu vaccine and RSV vaccine  Allison Clark appears to be doing pretty well and I think there is an opportunity to consolidate her medical therapy we will have her discontinue her Alvesco at this point time where she continues to use benralizumab on a consistent basis.  She is the option  of starting Flonase should she develop significant upper airway symptoms and she can continue on omeprazole to address her LPR.  I will see her back in this clinic in 6 months or earlier if there is a problem.  [name], MD Allergy / Immunology [place]

## 2022-05-07 ENCOUNTER — Encounter: Payer: Self-pay | Admitting: Allergy and Immunology

## 2022-05-07 ENCOUNTER — Telehealth: Payer: Self-pay

## 2022-05-07 DIAGNOSIS — I420 Dilated cardiomyopathy: Secondary | ICD-10-CM

## 2022-05-07 DIAGNOSIS — E782 Mixed hyperlipidemia: Secondary | ICD-10-CM

## 2022-05-07 LAB — BASIC METABOLIC PANEL
BUN/Creatinine Ratio: 25 (ref 12–28)
BUN: 20 mg/dL (ref 8–27)
CO2: 22 mmol/L (ref 20–29)
Calcium: 9.6 mg/dL (ref 8.7–10.3)
Chloride: 100 mmol/L (ref 96–106)
Creatinine, Ser: 0.79 mg/dL (ref 0.57–1.00)
Glucose: 92 mg/dL (ref 70–99)
Potassium: 4.6 mmol/L (ref 3.5–5.2)
Sodium: 139 mmol/L (ref 134–144)
eGFR: 80 mL/min/{1.73_m2} (ref >59)

## 2022-05-07 NOTE — Telephone Encounter (Signed)
Spoke with pt about her questions about Aldactone and a Nutritional referral. Pt was agreeable and verbalized understanding. Referral to Nutritionist made. Pt will call for any questions or concerns.

## 2022-05-09 ENCOUNTER — Telehealth: Payer: Self-pay

## 2022-05-09 NOTE — Telephone Encounter (Signed)
Results reviewed with pt as per Dr. Krasowski's note.  Pt verbalized understanding and had no additional questions. Routed to PCP  

## 2022-05-22 ENCOUNTER — Encounter: Payer: Self-pay | Admitting: *Deleted

## 2022-05-24 DIAGNOSIS — H16141 Punctate keratitis, right eye: Secondary | ICD-10-CM | POA: Diagnosis not present

## 2022-05-27 DIAGNOSIS — I420 Dilated cardiomyopathy: Secondary | ICD-10-CM | POA: Diagnosis not present

## 2022-05-27 DIAGNOSIS — E669 Obesity, unspecified: Secondary | ICD-10-CM | POA: Diagnosis not present

## 2022-05-27 DIAGNOSIS — I493 Ventricular premature depolarization: Secondary | ICD-10-CM | POA: Diagnosis not present

## 2022-05-27 DIAGNOSIS — T63441A Toxic effect of venom of bees, accidental (unintentional), initial encounter: Secondary | ICD-10-CM | POA: Diagnosis not present

## 2022-05-27 IMAGING — CT CT CARDIAC CORONARY ARTERY CALCIUM SCORE
3 series · 14 of 20 positions shown, 16 images · non-contrast
Comparison: 07/23/2018 chest radiograph.  Chest CT 05/09/2005

CLINICAL DATA: Hyperlipidemia. Left breast cancer 17 years ago with
lumpectomy. Radiation and chemotherapy. Asthma.

EXAM:
CT CARDIAC CORONARY ARTERY CALCIUM SCORE
TECHNIQUE: Non-contrast imaging through the heart was performed using
prospective ECG gating. Image post processing was performed on an
independent workstation, allowing for quantitative analysis of the
heart and coronary arteries. Note that this exam targets the heart
and the chest was not imaged in its entirety.

[Series 2: calcium scoring 2.00 qr36 bestdiast 72% hrt calciu · axial · 0.41mm/px · z∈[+1544,+1628]mm · 4 of 70 slices shown]
[im 14/70  vessel]
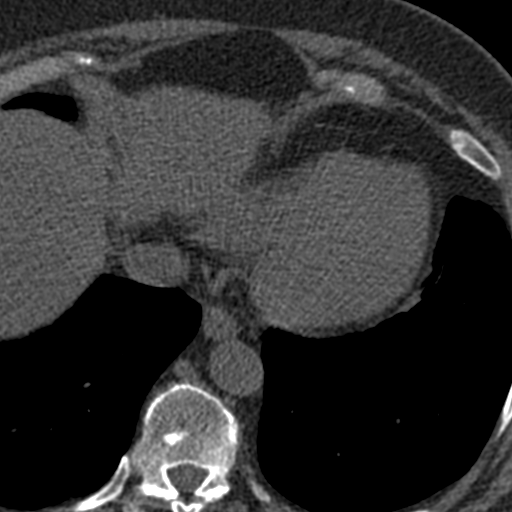
[im 28/70  vessel]
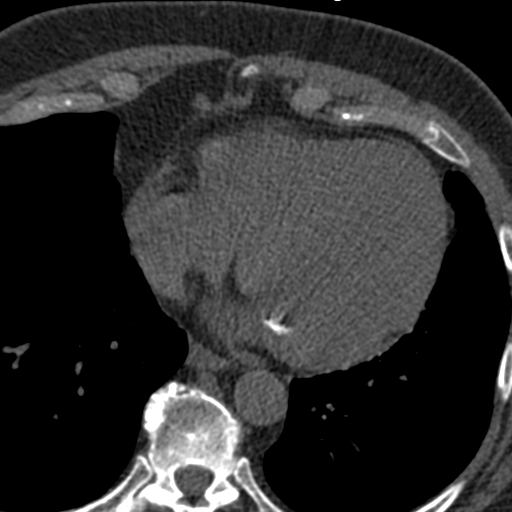
[im 42/70  vessel]
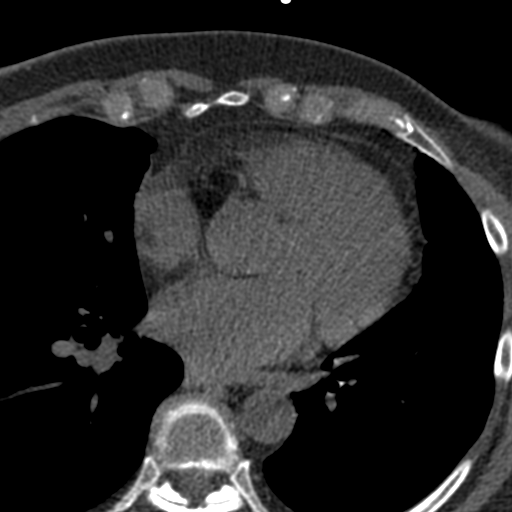
[im 56/70  vessel]
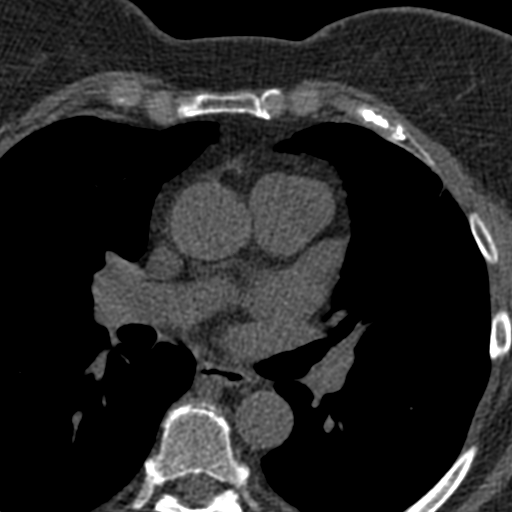

[Series 3: calcium scoring 2.00 br40 bestdiast 72% axial · axial · 0.62mm/px · z∈[+1540,+1632]mm · 5 of 70 slices shown, 7 images]
[im 12/70  vessel]
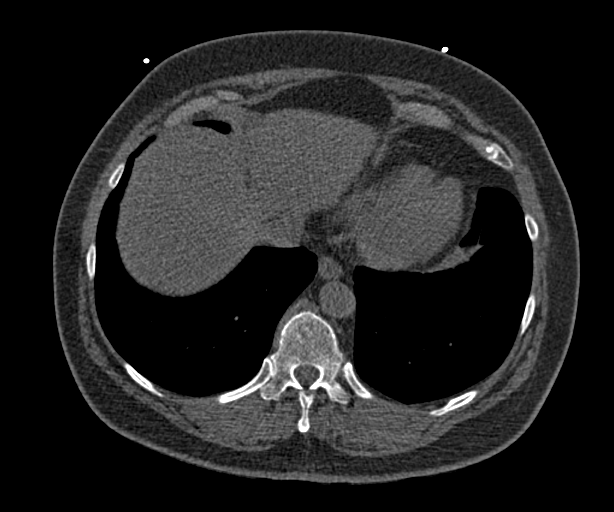
[im 12/70  lung]
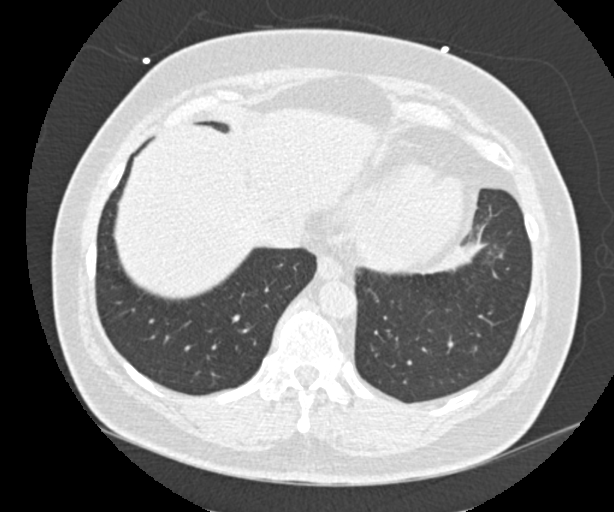
[im 24/70  vessel]
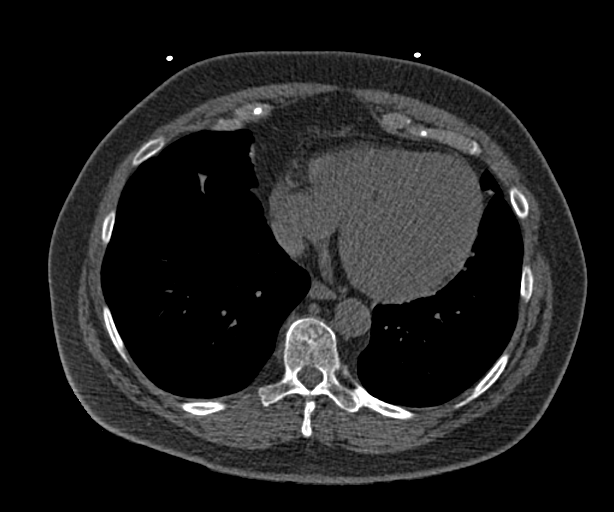
[im 35/70  vessel]
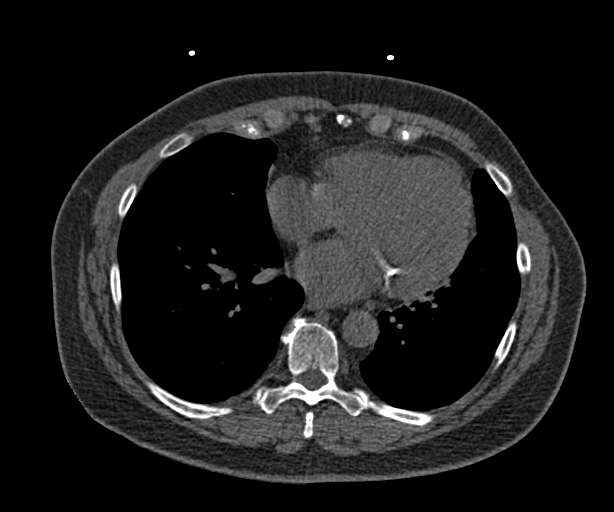
[im 47/70  vessel]
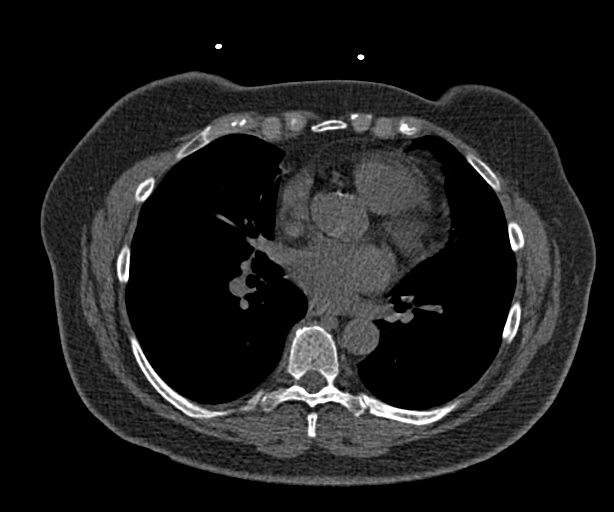
[im 58/70  vessel]
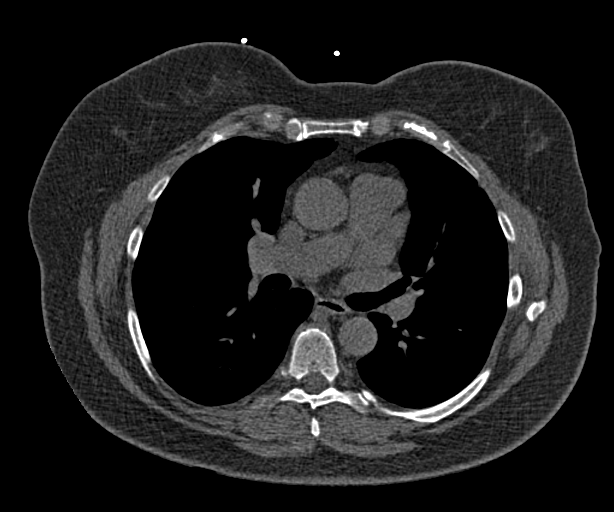
[im 58/70  lung]
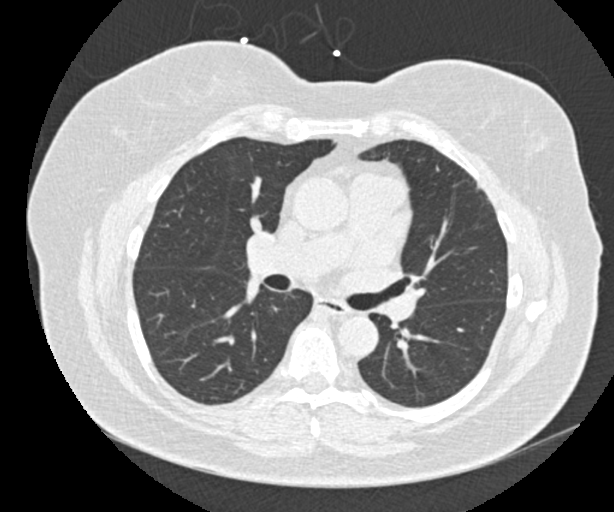

[Series 9: calcium scoring 2.00 br60 bestdiast 72% lungs · axial · 0.62mm/px · z∈[+1540,+1632]mm · 5 of 70 slices shown]
[im 12/70  vessel]
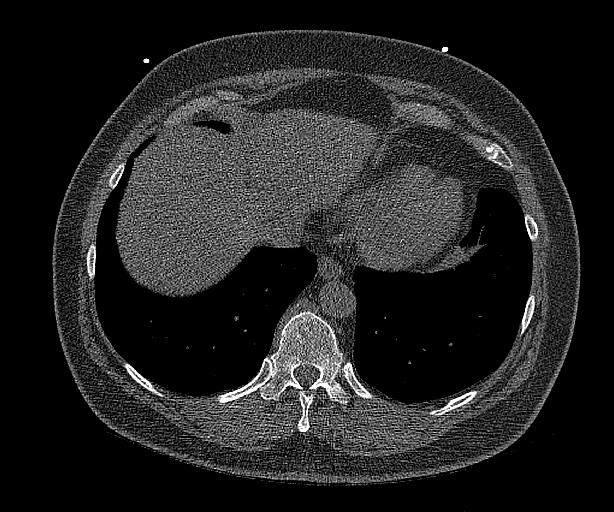
[im 24/70  vessel]
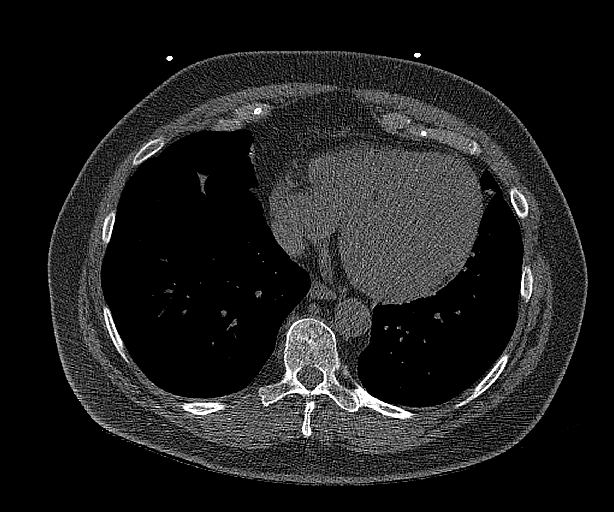
[im 35/70  vessel]
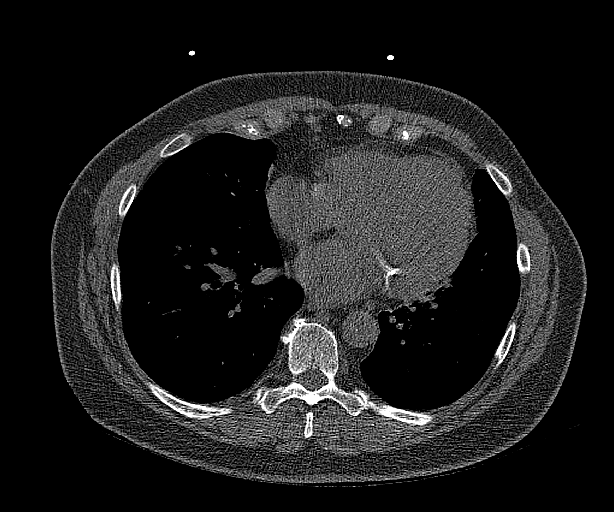
[im 47/70  vessel]
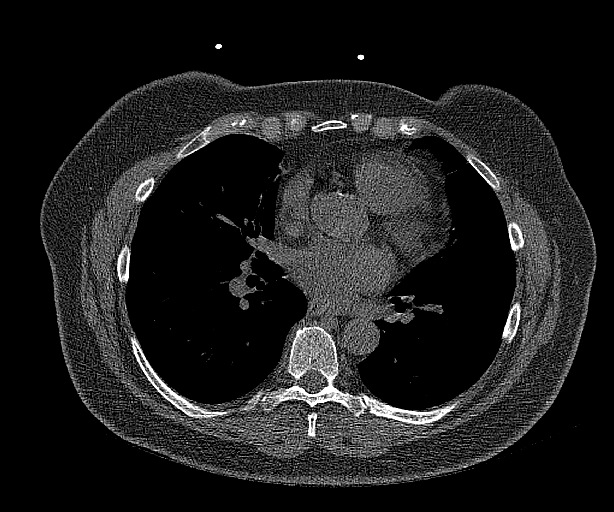
[im 58/70  vessel]
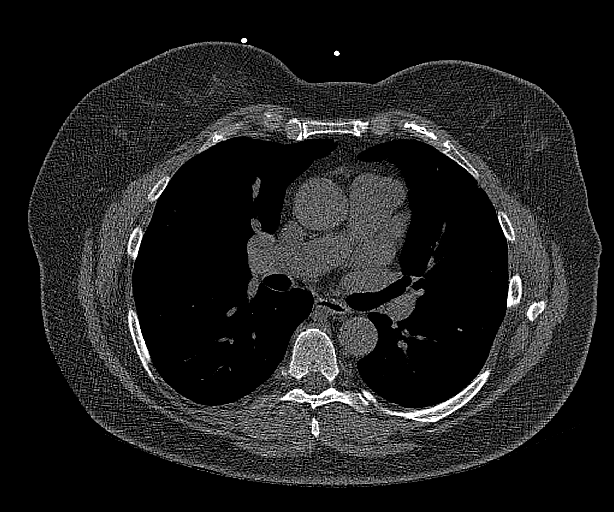

[14 of 20 positions shown; findings below may reference images not displayed]

FINDINGS: CORONARY CALCIUM SCORES:

Left Main: 0

LAD:

LCx: 0

RCA:

Total Agatston Score:

[HOSPITAL] percentile: 62nd

AORTA MEASUREMENTS:

Ascending Aorta: 31 mm

Descending Aorta: 21 mm

OTHER FINDINGS:

Cardiovascular: Aortic atherosclerosis.  Borderline cardiomegaly.

Mediastinum/Nodes: No imaged thoracic adenopathy.

Lungs/Pleura: Right middle lobe and lingular mild scarring. No
pleural fluid.

Upper Abdomen: Moderate hepatic steatosis. Incompletely imaged
low-density left hepatic lobe lesions are likely cysts, including at
up to 1.6 cm. Normal imaged portions of the spleen.

Musculoskeletal: No acute osseous abnormality.
IMPRESSION: 1. Total Agatston score of 37.6, corresponding to 62nd percentile
for age, sex, and race based cohort.
2. Hepatic steatosis.
3. Aortic Atherosclerosis ([id number]).

## 2022-05-29 DIAGNOSIS — H10411 Chronic giant papillary conjunctivitis, right eye: Secondary | ICD-10-CM | POA: Diagnosis not present

## 2022-06-05 DIAGNOSIS — H10411 Chronic giant papillary conjunctivitis, right eye: Secondary | ICD-10-CM | POA: Diagnosis not present

## 2022-06-05 DIAGNOSIS — H16141 Punctate keratitis, right eye: Secondary | ICD-10-CM | POA: Diagnosis not present

## 2022-06-21 ENCOUNTER — Encounter: Payer: PPO | Attending: Internal Medicine | Admitting: Dietician

## 2022-06-21 ENCOUNTER — Encounter: Payer: Self-pay | Admitting: Dietician

## 2022-06-21 DIAGNOSIS — E782 Mixed hyperlipidemia: Secondary | ICD-10-CM | POA: Insufficient documentation

## 2022-06-21 DIAGNOSIS — Z713 Dietary counseling and surveillance: Secondary | ICD-10-CM | POA: Diagnosis not present

## 2022-06-21 DIAGNOSIS — I420 Dilated cardiomyopathy: Secondary | ICD-10-CM | POA: Diagnosis not present

## 2022-06-21 NOTE — Progress Notes (Signed)
Medical Nutrition Therapy  Appointment Start time:  [phone]  Appointment End time:  [phone] Patient is here with her husband. Primary concerns today: She would like to learn how to eat to better care for her heart. She is aiming to follow a low sodium diet She has an air fryer.  She has a cookbook form the [employer] as well as recipes from an [place] site.   Referral diagnosis: dilated cardiomyopathy and mixed hyperlipidemia Preferred learning style: no preference indicated Learning readiness: ready, change in progress   NUTRITION ASSESSMENT   Anthropometrics  63" 176 lbs 06/21/2022 197 lbs 12/2021 some fluid retention  Clinical Medical Hx: dilated cardiomyopathy, history of covid, history of breast cancer (2004) with chemo and radiation, thyroid removed, GERD, HTN, asthma Medications: see list to include lasix, spironolactone Labs: not available Notable Signs/Symptoms: shortness of breath (mild)  Lifestyle & Dietary Hx Patient lives with her husband.  She does the shopping and cooking.  Eats out 1-2 times per day.  Has 1 "free day" per week and eats BBQ.  Otherwise aims to eat LS.  She is a retired Web designer.  Estimated daily fluid intake: 60 oz Supplements: vitamin D Sleep: 6-8 hours per night plus occasional naps Stress / self-care: normal Current average weekly physical activity: active with life  24-Hr Dietary Recall First Meal: [employer] toast with PB OR boiled egg, [employer] toast with butter or PB occasional bacon or LS bacon OR shredded wheat, 2% milk, occasional fruit Snack: none Second Meal: wrap at [employer] or kid's meal OR shared plate at [employer] OR shrimp and 12 backed potato out OR Dario shared grilled chicken wrap and fries  Snack: occasional cookies Third Meal: similar to lunch OR cooks LS tacos and salsa and LS tortilla salt, low fat cheese and sour cream Snack: balanced break (cheese, dried fruit, nuts) Beverages: 1-2 diet caffeine free soda daily, water, coffee  with splenda and sugar free creamer, 2% milk, occasional cranberry juice, decaf tea with splenda  NUTRITION DIAGNOSIS  NB-1.1 Food and nutrition-related knowledge deficit As related to cardiomyopathy and dyslipidemia.  As evidenced by patient report.  NUTRITION INTERVENTION  Nutrition education (E-1) on the following topics:  Hidden sources of sodium Fluid intake Review of diuretics Avoid potassium containing products such as "no salt" and "lite salt" Heart healthy nutrition therapy  Handouts Provided Include  Low sodium seasoning tips from AND Low sodium nutrition therapy from AND Heart Healthy Label Reading from AND Heart Healthy Shopping Tips from AND My Plate Meal Plan Card  Learning Style & Readiness for Change Teaching method utilized: Visual & Auditory  Demonstrated degree of understanding via: Teach Back  Barriers to learning/adherence to lifestyle change: none  Goals Established by Pt Continue a low sodium diet. Continue to read labels Find ways to eat at home more frequently.  Great job on mindful eating when eating out  Continue splitting entrees  Consider adding a salad with dressing on the side or olive oil and lemon or vinegar  Add more fruits and vegetables (aim for 5 per day at least). Legumes (beans) are great to include daily.  Rinse or buy Low sodium  Consider trying  Ezekiel bread  Low sodium natural peanut butter (only ingredient is peanuts)  Consider adding the Calorie [employer] app to your phone   MONITORING & EVALUATION Dietary intake, weekly physical activity prn  Next Steps  Patient is to call for question.

## 2022-06-21 NOTE — Patient Instructions (Signed)
Continue a low sodium diet. Continue to read labels Find ways to eat at home more frequently.  Great job on mindful eating when eating out  Continue splitting entrees  Consider adding a salad with dressing on the side or olive oil and lemon or vinegar  Add more fruits and vegetables (aim for 5 per day at least). Legumes (beans) are great to include daily.  Rinse or buy Low sodium  Consider trying  Ezekiel bread  Low sodium natural peanut butter (only ingredient is peanuts)  Consider adding the Calorie [employer] app to your phone

## 2022-06-24 ENCOUNTER — Encounter: Payer: Self-pay | Admitting: Cardiology

## 2022-06-25 ENCOUNTER — Other Ambulatory Visit: Payer: Self-pay

## 2022-06-25 MED ORDER — FUROSEMIDE 20 MG PO TABS: ORAL_TABLET | ORAL | 3 refills | Status: DC

## 2022-07-03 DIAGNOSIS — H40013 Open angle with borderline findings, low risk, bilateral: Secondary | ICD-10-CM | POA: Diagnosis not present

## 2022-07-09 ENCOUNTER — Ambulatory Visit: Payer: PPO | Admitting: Dietician

## 2022-07-15 ENCOUNTER — Ambulatory Visit: Payer: PPO | Attending: Cardiology | Admitting: Cardiology

## 2022-07-15 ENCOUNTER — Encounter: Payer: Self-pay | Admitting: Cardiology

## 2022-07-15 VITALS — BP 110/62 | HR 73 | Ht 62.5 in | Wt 174.6 lb

## 2022-07-15 DIAGNOSIS — R0609 Other forms of dyspnea: Secondary | ICD-10-CM | POA: Diagnosis not present

## 2022-07-15 DIAGNOSIS — I420 Dilated cardiomyopathy: Secondary | ICD-10-CM | POA: Diagnosis not present

## 2022-07-15 DIAGNOSIS — E782 Mixed hyperlipidemia: Secondary | ICD-10-CM | POA: Diagnosis not present

## 2022-07-15 DIAGNOSIS — I10 Essential (primary) hypertension: Secondary | ICD-10-CM

## 2022-07-15 NOTE — Addendum Note (Signed)
Addended by: Jacobo Forest D on: 07/15/2022 10:44 AM   Modules accepted: Orders

## 2022-07-15 NOTE — Progress Notes (Signed)
Cardiology Office Note:    Date:  07/15/2022   ID:  Allison Clark, DOB [birth year], MRN [id number]  PCP:  Crist Infante, MD  Cardiologist:  Jenne Campus, MD    Referring MD: Crist Infante, MD   Chief Complaint  Patient presents with   Follow-up  Doing fine  History of Present Illness:    AMI Allison Clark is a 71 y.o. female with past medical history significant for cardiomyopathy ejection fraction 25 to 30%, left bundle branch block, she did have a calcium scoring done in 2021 which was 37.6, coronary CT angio done in summer of this year showing minimal nonobstructive coronary artery disease, so we can conclude that her cardiomyopathy is not related to coronary artery disease.  Overall she is doing very well.  She denies of any chest pain tightness squeezing pressure burning chest she does what she wants to do with no difficulties.  Overall anticipate ejection fraction improved  Past Medical History:  Diagnosis Date   Asthma    BBB (bundle branch block)    left   Breast cancer ([place]) 2004   Left Breast Cancer   GERD (gastroesophageal reflux disease)    Thyroid disease     Past Surgical History:  Procedure Laterality Date   BREAST LUMPECTOMY Left 2004   malignant   BREAST SURGERY     STERILIZATION     THYROIDECTOMY     TONSILLECTOMY     TUBAL LIGATION      Current Medications: Current Meds  Medication Sig   Benralizumab (FASENRA PEN) 30 MG/ML SOAJ Inject 1 mL (30 mg total) into the skin every 8 (eight) weeks.   Cholecalciferol (VITAMIN D3 PO) Take 1 tablet by mouth daily.   EPINEPHrine 0.3 mg/0.3 mL IJ SOAJ injection Use as directed for life threatening allergic reactions (Patient taking differently: Inject 0.3 mg into the muscle as needed for anaphylaxis. Use as directed for life threatening allergic reactions)   fluticasone (FLONASE) 50 MCG/ACT nasal spray Place 1 spray into both nostrils daily.    furosemide (LASIX) 20 MG tablet Take one tablet once daily (Patient  taking differently: Take 20 mg by mouth 2 (two) times daily. Take one tablet once daily)   ibandronate (BONIVA) 150 MG tablet Take 150 mg by mouth every 30 (thirty) days.   levalbuterol (XOPENEX HFA) 45 MCG/ACT inhaler INHALE 2 PUFFS BY MOUTH EVERY 4 TO 6 HOURS AS NEEDED FOR COUGH OR WHEEZE (Patient taking differently: Inhale 2 puffs into the lungs every 6 (six) hours as needed for wheezing or shortness of breath. INHALE 2 PUFFS BY MOUTH EVERY 4 TO 6 HOURS AS NEEDED FOR COUGH OR WHEEZE)   levothyroxine (SYNTHROID, LEVOTHROID) 112 MCG tablet Take 112 mcg by mouth daily before breakfast.    loratadine (CLARITIN) 10 MG tablet Take 10 mg by mouth daily.   metoprolol succinate (TOPROL-XL) 50 MG 24 hr tablet Take 1 tablet (50 mg total) by mouth daily. Take with or immediately following a meal.   omeprazole (PRILOSEC) 40 MG capsule 1 capsule 1-2 times per day (Patient taking differently: Take 40 mg by mouth daily. 1 capsule 1-2 times per day)   sacubitril-valsartan (ENTRESTO) 49-51 MG Take 1 tablet by mouth 2 (two) times daily.   simvastatin (ZOCOR) 20 MG tablet Take 20 mg by mouth at bedtime.    spironolactone (ALDACTONE) 25 MG tablet Take 1 tablet (25 mg total) by mouth daily.   [DISCONTINUED] ALVESCO 160 MCG/ACT inhaler Inhale one puff with spacer twice daily  to prevent cough or wheeze.  Rinse, gargle, and spit after use.     Allergies:   Avelox [moxifloxacin hcl in nacl], Albuterol, Anastrozole, Atorvastatin, Ciclesonide, Ezetimibe, Metronidazole, and Sulfa antibiotics   Social History   Socioeconomic History   Marital status: Married    Spouse name: Not on file   Number of children: Not on file   Years of education: Not on file   Highest education level: Not on file  Occupational History   Not on file  Tobacco Use   Smoking status: Never   Smokeless tobacco: Never  Vaping Use   Vaping Use: Never used  Substance and Sexual Activity   Alcohol use: No   Drug use: No   Sexual activity: Not  Currently  Other Topics Concern   Not on file  Social History Narrative   Not on file   Social Determinants of Health   Financial Resource Strain: Not on file  Food Insecurity: Not on file  Transportation Needs: Not on file  Physical Activity: Not on file  Stress: Not on file  Social Connections: Not on file     Family History: The patient's family history includes Allergies in her son. There is no history of Allergic rhinitis, Angioedema, Asthma, Eczema, Immunodeficiency, Urticaria, or Breast cancer. ROS:   Please see the history of present illness.    All 14 point review of systems negative except as described per history of present illness  EKGs/Labs/Other Studies Reviewed:      Recent Labs: 12/13/2021: ALT 14; NT-Pro BNP 221; TSH 2.990 05/06/2022: BUN 20; Creatinine, Ser 0.79; Potassium 4.6; Sodium 139  Recent Lipid Panel No results found for: "CHOL", "TRIG", "HDL", "CHOLHDL", "VLDL", "LDLCALC", "LDLDIRECT"  Physical Exam:    VS:  BP 110/62 (BP Location: Left Arm, Patient Position: Sitting)   Pulse 73   Ht 5' 2.5" (1.588 m)   Wt 174 lb 9.6 oz (79.2 kg)   SpO2 94%   BMI 31.43 kg/m     Wt Readings from Last 3 Encounters:  07/15/22 174 lb 9.6 oz (79.2 kg)  04/08/22 177 lb 9.6 oz (80.6 kg)  02/05/22 181 lb 9.6 oz (82.4 kg)     GEN:  Well nourished, well developed in no acute distress HEENT: Normal NECK: No JVD; No carotid bruits LYMPHATICS: No lymphadenopathy CARDIAC: RRR, no murmurs, no rubs, no gallops RESPIRATORY:  Clear to auscultation without rales, wheezing or rhonchi  ABDOMEN: Soft, non-tender, non-distended MUSCULOSKELETAL:  No edema; No deformity  SKIN: Warm and dry LOWER EXTREMITIES: no swelling NEUROLOGIC:  Alert and oriented x 3 PSYCHIATRIC:  Normal affect   ASSESSMENT:    1. Essential hypertension   2. Dilated cardiomyopathy ([place])   3. Mixed hyperlipidemia    PLAN:    In order of problems listed above:  Cardiomyopathy unknown origin I  will ask her to have echocardiogram performed.  If echocardiogram is good we will continue present medications.  If echocardiogram showed persistent left ventricle diminished ejection fraction will consider more advanced therapy including more advanced investigation for causes of her cardiomyopathy.  Clinically she is doing very well. Essential hypertension blood pressure well controlled continue present management.  Mixed dyslipidemia she is intolerant to multiple statin.  I did review K PN which show me LDL 68 HDL 44, we will continue present management.  She is on Zocor 20.   Medication Adjustments/Labs and Tests Ordered: Current medicines are reviewed at length with the patient today.  Concerns regarding medicines are outlined above.  No orders of the defined types were placed in this encounter.  Medication changes: No orders of the defined types were placed in this encounter.   Signed, Park Liter, MD, [facility] 07/15/2022 10:35 AM    [place]

## 2022-07-15 NOTE — Patient Instructions (Addendum)
Medication Instructions:  Your physician recommends that you continue on your current medications as directed. Please refer to the Current Medication list given to you today.  *If you need a refill on your cardiac medications before your next appointment, please call your pharmacy*   Lab Work: None Ordered If you have labs (blood work) drawn today and your tests are completely normal, you will receive your results only by: MyChart Message (if you have MyChart) OR A paper copy in the mail If you have any lab test that is abnormal or we need to change your treatment, we will call you to review the results.   Testing/Procedures: Your physician has requested that you have an echocardiogram. Echocardiography is a painless test that uses sound waves to create images of your heart. It provides your doctor with information about the size and shape of your heart and how well your heart's chambers and valves are working. This procedure takes approximately one hour. There are no restrictions for this procedure.    Follow-Up: At [facility], you and your health needs are our priority.  As part of our continuing mission to provide you with exceptional heart care, we have created designated Provider Care Teams.  These Care Teams include your primary Cardiologist (physician) and Advanced Practice Providers (APPs -  Physician Assistants and Nurse Practitioners) who all work together to provide you with the care you need, when you need it.  We recommend signing up for the patient portal called "MyChart".  Sign up information is provided on this After Visit Summary.  MyChart is used to connect with patients for Virtual Visits (Telemedicine).  Patients are able to view lab/test results, encounter notes, upcoming appointments, etc.  Non-urgent messages can be sent to your provider as well.   To learn more about what you can do with MyChart, go to [email or web address].    Your next appointment:   3  month(s)  The format for your next appointment:   In Person  Provider:   Robert Krasowski, MD    Other Instructions NA  

## 2022-07-20 DIAGNOSIS — Z23 Encounter for immunization: Secondary | ICD-10-CM | POA: Diagnosis not present

## 2022-07-23 DIAGNOSIS — E785 Hyperlipidemia, unspecified: Secondary | ICD-10-CM | POA: Diagnosis not present

## 2022-07-23 DIAGNOSIS — R7301 Impaired fasting glucose: Secondary | ICD-10-CM | POA: Diagnosis not present

## 2022-07-23 DIAGNOSIS — I10 Essential (primary) hypertension: Secondary | ICD-10-CM | POA: Diagnosis not present

## 2022-07-23 DIAGNOSIS — R5383 Other fatigue: Secondary | ICD-10-CM | POA: Diagnosis not present

## 2022-07-23 DIAGNOSIS — E039 Hypothyroidism, unspecified: Secondary | ICD-10-CM | POA: Diagnosis not present

## 2022-07-23 DIAGNOSIS — Z79899 Other long term (current) drug therapy: Secondary | ICD-10-CM | POA: Diagnosis not present

## 2022-07-23 DIAGNOSIS — R7989 Other specified abnormal findings of blood chemistry: Secondary | ICD-10-CM | POA: Diagnosis not present

## 2022-07-23 DIAGNOSIS — Z1212 Encounter for screening for malignant neoplasm of rectum: Secondary | ICD-10-CM | POA: Diagnosis not present

## 2022-07-26 ENCOUNTER — Ambulatory Visit: Payer: PPO | Attending: Cardiology

## 2022-07-26 DIAGNOSIS — R0609 Other forms of dyspnea: Secondary | ICD-10-CM | POA: Diagnosis not present

## 2022-07-26 LAB — ECHOCARDIOGRAM COMPLETE
Area-P 1/2: 2.93 cm2
Calc EF: 37.6 %
S' Lateral: 3.3 cm
Single Plane A2C EF: 41.1 %
Single Plane A4C EF: 35.6 %

## 2022-07-30 DIAGNOSIS — E785 Hyperlipidemia, unspecified: Secondary | ICD-10-CM | POA: Diagnosis not present

## 2022-07-30 DIAGNOSIS — Z853 Personal history of malignant neoplasm of breast: Secondary | ICD-10-CM | POA: Diagnosis not present

## 2022-07-30 DIAGNOSIS — Z1339 Encounter for screening examination for other mental health and behavioral disorders: Secondary | ICD-10-CM | POA: Diagnosis not present

## 2022-07-30 DIAGNOSIS — M8580 Other specified disorders of bone density and structure, unspecified site: Secondary | ICD-10-CM | POA: Diagnosis not present

## 2022-07-30 DIAGNOSIS — Z0000 Encounter for general adult medical examination without abnormal findings: Secondary | ICD-10-CM | POA: Diagnosis not present

## 2022-07-30 DIAGNOSIS — R7301 Impaired fasting glucose: Secondary | ICD-10-CM | POA: Diagnosis not present

## 2022-07-30 DIAGNOSIS — M1990 Unspecified osteoarthritis, unspecified site: Secondary | ICD-10-CM | POA: Diagnosis not present

## 2022-07-30 DIAGNOSIS — I2510 Atherosclerotic heart disease of native coronary artery without angina pectoris: Secondary | ICD-10-CM | POA: Diagnosis not present

## 2022-07-30 DIAGNOSIS — I10 Essential (primary) hypertension: Secondary | ICD-10-CM | POA: Diagnosis not present

## 2022-07-30 DIAGNOSIS — L918 Other hypertrophic disorders of the skin: Secondary | ICD-10-CM | POA: Diagnosis not present

## 2022-07-30 DIAGNOSIS — Z1331 Encounter for screening for depression: Secondary | ICD-10-CM | POA: Diagnosis not present

## 2022-07-30 DIAGNOSIS — K7689 Other specified diseases of liver: Secondary | ICD-10-CM | POA: Diagnosis not present

## 2022-07-30 DIAGNOSIS — R82998 Other abnormal findings in urine: Secondary | ICD-10-CM | POA: Diagnosis not present

## 2022-07-30 DIAGNOSIS — I700 Atherosclerosis of aorta: Secondary | ICD-10-CM | POA: Diagnosis not present

## 2022-07-30 DIAGNOSIS — G720 Drug-induced myopathy: Secondary | ICD-10-CM | POA: Diagnosis not present

## 2022-08-01 ENCOUNTER — Encounter: Payer: Self-pay | Admitting: Cardiology

## 2022-08-05 ENCOUNTER — Telehealth: Payer: Self-pay

## 2022-08-05 MED ORDER — DAPAGLIFLOZIN PROPANEDIOL 5 MG PO TABS: 5.0000 mg | ORAL_TABLET | Freq: Every day | ORAL | 1 refills | Status: DC

## 2022-08-05 MED ORDER — ENTRESTO 49-51 MG PO TABS: 1.0000 | ORAL_TABLET | Freq: Two times a day (BID) | ORAL | 1 refills | Status: DC

## 2022-08-05 NOTE — Telephone Encounter (Signed)
-----   Message from Park Liter, MD sent at 08/01/2022  7:38 PM EDT ----- Echocardiogram showed improvement left ventricle ejection fraction now 40 to 45% with normal being 55, there is a good improvement.  I would suggest to stop Lasix and add Farxiga 5 mg daily,

## 2022-08-05 NOTE — Telephone Encounter (Signed)
Patient notified of results and recommendations and agreed with plan. She also ask for 90 day supply on Entresto. Both meds sent. Med list updated.

## 2022-08-12 DIAGNOSIS — Z1211 Encounter for screening for malignant neoplasm of colon: Secondary | ICD-10-CM | POA: Diagnosis not present

## 2022-10-15 ENCOUNTER — Other Ambulatory Visit: Payer: Self-pay | Admitting: Allergy and Immunology

## 2022-10-21 ENCOUNTER — Ambulatory Visit (INDEPENDENT_AMBULATORY_CARE_PROVIDER_SITE_OTHER): Payer: PPO | Admitting: Neurology

## 2022-10-21 ENCOUNTER — Encounter: Payer: Self-pay | Admitting: Neurology

## 2022-10-21 VITALS — BP 103/51 | HR 71 | Ht 63.0 in | Wt 173.6 lb

## 2022-10-21 DIAGNOSIS — R351 Nocturia: Secondary | ICD-10-CM | POA: Diagnosis not present

## 2022-10-21 DIAGNOSIS — E669 Obesity, unspecified: Secondary | ICD-10-CM | POA: Diagnosis not present

## 2022-10-21 DIAGNOSIS — R634 Abnormal weight loss: Secondary | ICD-10-CM | POA: Diagnosis not present

## 2022-10-21 DIAGNOSIS — I420 Dilated cardiomyopathy: Secondary | ICD-10-CM

## 2022-10-21 NOTE — Progress Notes (Signed)
Subjective:    Patient ID: Allison Clark is a 72 y.o. female.  HPI    Star Age, MD, PhD [facility] [address], [address] [place], [place] [address]  Dear Dr. Joylene Draft,  I saw your patient, Allison Clark, upon your kind request, in my Sleep clinic today for initial consultation of her sleep disorder, in particular, concern for underlying obstructive sleep apnea.  The patient is accompanied by her husband today.  As you know, Allison Clark is a 72 year old female with an underlying medical history of breast cancer (status post lumpectomy, chemotherapy, XRT, Arimidex and tamoxifen), asthma, allergies, reflux disease, hypertension, osteoarthritis, remote history of pneumonia, osteopenia, C. difficile colitis, diverticulitis, dilated cardiomyopathy and borderline obesity, who reports mouth breathing and prior history of oxygen desaturations at night but recently improved oxygen saturations, improved snoring and weight loss.  She does not really have any residual snoring or excessive daytime somnolence, has lost about 30 pounds since her diagnosis of cardiomyopathy.  She is followed by cardiology and in fact has an appointment pending for tomorrow.  She would like to hold off on sleep testing until she has a chance to discuss this with her cardiologist.  Her husband has not noticed any significant snoring or apneas.  She goes to bed between 1130 and midnight and rise time is generally around 7:30 AM.  She denies recurrent nocturnal or morning headaches but does have nocturia about once or twice per average night. Her Epworth sleepiness score is 5 out of 24, fatigue severity score is 14 out of 63.  She is a non-smoker and drinks alcohol less than once a month, no daily caffeine.  She is retired.  They have 3 grown children.  She lives with her husband, currently no pets in the household.  She does have a TV on at night in her bedroom, but turns it off before falling  asleep.  I reviewed your office note from 07/30/2022.  She is not aware of any family history of sleep apnea.  She had a tonsillectomy at age 38.  She is supposed to start a new asthma medication, Nucala injections, as she could not get the [place] any longer.  She has been on [place] per cardiology.  She was noted to have an improved ejection fraction on her latest echocardiogram.  I reviewed the results.  Her Past Medical History Is Significant For: Past Medical History:  Diagnosis Date   Asthma    BBB (bundle branch block)    left   Breast cancer ([place]) 2004   Left Breast Cancer   Cardiomyopathy ([place])    GERD (gastroesophageal reflux disease)    Thyroid disease     Her Past Surgical History Is Significant For: Past Surgical History:  Procedure Laterality Date   BREAST LUMPECTOMY Left 2004   malignant   BREAST SURGERY     STERILIZATION     THYROIDECTOMY     TONSILLECTOMY     TUBAL LIGATION      Her Family History Is Significant For: Family History  Problem Relation Age of Onset   Allergies Son    Allergic rhinitis Neg Hx    Angioedema Neg Hx    Asthma Neg Hx    Eczema Neg Hx    Immunodeficiency Neg Hx    Urticaria Neg Hx    Breast cancer Neg Hx    Sleep apnea Neg Hx     Her Social History Is Significant For: Social History  Socioeconomic History   Marital status: Married    Spouse name: Not on file   Number of children: Not on file   Years of education: Not on file   Highest education level: Not on file  Occupational History   Not on file  Tobacco Use   Smoking status: Never   Smokeless tobacco: Never  Vaping Use   Vaping Use: Never used  Substance and Sexual Activity   Alcohol use: No   Drug use: No   Sexual activity: Not Currently  Other Topics Concern   Not on file  Social History Narrative   Not on file   Social Determinants of Health   Financial Resource Strain: Not on file  Food Insecurity: Not on file  Transportation Needs: Not on file   Physical Activity: Not on file  Stress: Not on file  Social Connections: Not on file    Her Allergies Are:  Allergies  Allergen Reactions   Avelox [Moxifloxacin Hcl In Nacl] Hives   Albuterol Other (See Comments)   Anastrozole Other (See Comments)   Atorvastatin Other (See Comments)   Ciclesonide Other (See Comments)   Ezetimibe Other (See Comments)   Metronidazole Other (See Comments)   Sulfa Antibiotics Rash  :   Her Current Medications Are:  Outpatient Encounter Medications as of 10/21/2022  Medication Sig   Cholecalciferol (VITAMIN D3 PO) Take 1 tablet by mouth daily.   dapagliflozin propanediol (FARXIGA) 5 MG TABS tablet Take 1 tablet (5 mg total) by mouth daily before breakfast.   EPINEPHrine 0.3 mg/0.3 mL IJ SOAJ injection Use as directed for life threatening allergic reactions (Patient taking differently: Inject 0.3 mg into the muscle as needed for anaphylaxis. Use as directed for life threatening allergic reactions)   fluticasone (FLONASE) 50 MCG/ACT nasal spray Place 1 spray into both nostrils daily.    ibandronate (BONIVA) 150 MG tablet Take 150 mg by mouth every 30 (thirty) days.   levalbuterol (XOPENEX HFA) 45 MCG/ACT inhaler INHALE 2 PUFFS BY MOUTH EVERY 4 TO 6 HOURS AS NEEDED FOR COUGH OR WHEEZE (Patient taking differently: Inhale 2 puffs into the lungs every 6 (six) hours as needed for wheezing or shortness of breath. INHALE 2 PUFFS BY MOUTH EVERY 4 TO 6 HOURS AS NEEDED FOR COUGH OR WHEEZE)   levothyroxine (SYNTHROID, LEVOTHROID) 112 MCG tablet Take 112 mcg by mouth daily before breakfast.    loratadine (CLARITIN) 10 MG tablet Take 10 mg by mouth daily.   metoprolol succinate (TOPROL-XL) 50 MG 24 hr tablet Take 1 tablet (50 mg total) by mouth daily. Take with or immediately following a meal.   omeprazole (PRILOSEC) 40 MG capsule TAKE 1 CAPSULE BY MOUTH 1 TO 2 TIMES PER DAY   sacubitril-valsartan (ENTRESTO) 49-51 MG Take 1 tablet by mouth 2 (two) times daily.    simvastatin (ZOCOR) 20 MG tablet Take 20 mg by mouth at bedtime.    spironolactone (ALDACTONE) 25 MG tablet Take 1 tablet (25 mg total) by mouth daily.   Benralizumab (FASENRA PEN) 30 MG/ML SOAJ Inject 1 mL (30 mg total) into the skin every 8 (eight) weeks.   No facility-administered encounter medications on file as of 10/21/2022.  :   Review of Systems:  Out of a complete 14 point review of systems, all are reviewed and negative with the exception of these symptoms as listed below:  Review of Systems  Neurological:        Pt her for sleep consult  Pt fatigue  Pt denies ,snoring ,hypertension,headaches,sleep study,CPAP machine     ESS:5 FSS:14    Objective:  Neurological Exam  Physical Exam Physical Examination:   Vitals:   10/21/22 1330  BP: (!) 103/51  Pulse: 71    General Examination: The patient is a very pleasant 72 y.o. female in no acute distress. She appears well-developed and well-nourished and well groomed.   HEENT: Normocephalic, atraumatic, pupils are equal, round and reactive to light, extraocular tracking is good without limitation to gaze excursion or nystagmus noted. Hearing is grossly intact. Face is symmetric with normal facial animation. Speech is clear with no dysarthria noted. There is no hypophonia. There is no lip, neck/head, jaw or voice tremor. Neck is supple with full range of passive and active motion. There are no carotid bruits on auscultation. Oropharynx exam reveals: mild mouth dryness, adequate dental hygiene and mild airway crowding, due to small airway entry, otherwise Mallampati class I, tonsils absent.  Neck circumference 15 inches.  Tongue protrudes centrally and palate elevates symmetrically.  Chest: Clear to auscultation without wheezing, rhonchi or crackles noted.  Heart: S1+S2+0, regular and normal without murmurs, rubs or gallops noted.   Abdomen: Soft, non-tender and non-distended.  Extremities: There is no pitting edema in the  distal lower extremities bilaterally.   Skin: Warm and dry without trophic changes noted.   Musculoskeletal: exam reveals no obvious joint deformities.   Neurologically:  Mental status: The patient is awake, alert and oriented in all 4 spheres. Her immediate and remote memory, attention, language skills and fund of knowledge are appropriate. There is no evidence of aphasia, agnosia, apraxia or anomia. Speech is clear with normal prosody and enunciation. Thought process is linear. Mood is normal and affect is normal.  Cranial nerves II - XII are as described above under HEENT exam.  Motor exam: Normal bulk, strength and tone is noted. There is no obvious action or resting tremor.  Fine motor skills and coordination: grossly intact.  Cerebellar testing: No dysmetria or intention tremor. There is no truncal or gait ataxia.  Sensory exam: intact to light touch in the upper and lower extremities.  Gait, station and balance: She stands easily. No veering to one side is noted. No leaning to one side is noted. Posture is age-appropriate and stance is narrow based. Gait shows normal stride length and normal pace. No problems turning are noted.   Assessment and Plan:  In summary, YAELIS SCHARFENBERG is a very pleasant 72 y.o.-year old female with an underlying medical history of breast cancer (status post lumpectomy, chemotherapy, XRT, Arimidex and tamoxifen), asthma, allergies, reflux disease, hypertension, osteoarthritis, remote history of pneumonia, osteopenia, C. difficile colitis, diverticulitis, dilated cardiomyopathy and borderline obesity, who presents for evaluation of her risk for sleep apnea.  She denies any significant snoring and feels that she has improved since she achieved weight loss.  She would like to hold off on sleep testing.  We talked about the diagnosis of obstructive sleep apnea, its prognosis and treatment options, the patient and her husband were advised that not always do we have  patients that have telltale symptoms but sometimes we do sleep evaluation secondary to the risk for sleep apnea especially once we deal with cardiac or neurologic complications.  Nevertheless, she has an appointment with her cardiologist and would like to run this by him.  I completely understand.  She is encouraged to call our office should she change her mind regarding sleep testing.  We talked about sleep apnea, also  the treatment option of AutoPap or CPAP therapy.  She would not be interested in coming in for a laboratory attended sleep study but would prefer a home sleep test if anything.  I explained the differences between the sleep test procedures and the fact that the negative home sleep test does not always rule out underlying obstructive sleep apnea, a laboratory attended sleep study is typically recommended.   At this juncture, we will play by ear.  She is encouraged to call our office or email [place] through [place] once she has seen her cardiologist.  I answered all their questions today and the patient and her husband were in agreement.   Thank you very much for allowing me to participate in the care of this nice patient. If I can be of any further assistance to you please do not hesitate to call me at [phone].  Sincerely,   Star Age, MD, PhD

## 2022-10-21 NOTE — Patient Instructions (Signed)
It was nice to meet you both today!   Here is what we discussed today:    Based on your symptoms and your exam I believe you could be at some risk for obstructive sleep apnea (aka OSA). We should proceed with a sleep study to determine whether you do or do not have OSA and how severe it is. Even, if you have mild OSA, I may want you to consider treatment with CPAP, as treatment of even borderline or mild sleep apnea can result and improvement of symptoms such as sleep disruption, daytime sleepiness, nighttime bathroom breaks, restless leg symptoms, improvement of headache syndromes, even improved mood disorder.   As explained, an attended sleep study (meaning you get to stay overnight in the sleep lab), lets [place] monitor sleep-related behaviors such as sleep talking and leg movements in sleep, in addition to monitoring for sleep apnea.  A home sleep test is a screening tool for sleep apnea diagnosis only, but unfortunately, does not help with any other sleep-related diagnoses.  I understand that you would like to wait till you see your cardiologist.   Please call us or email [place] through MyChart if you would like to proceed with sleep testing.   Please remember, the long-term risks and ramifications of untreated moderate to severe obstructive sleep apnea may include (but are not limited to): increased risk for cardiovascular disease, including congestive heart failure, stroke, difficult to control hypertension, treatment resistant obesity, arrhythmias, especially irregular heartbeat commonly known as A. Fib. (atrial fibrillation); even type 2 diabetes has been linked to untreated OSA.   Other correlations that untreated obstructive sleep apnea include macular edema which is swelling of the retina in the eyes, droopy eyelid syndrome, and elevated hemoglobin and hematocrit levels (often referred to as polycythemia).  Sleep apnea can cause disruption of sleep and sleep deprivation in most cases, which, in  turn, can cause recurrent headaches, problems with memory, mood, concentration, focus, and vigilance. Most people with untreated sleep apnea report excessive daytime sleepiness, which can affect their ability to drive. Please do not drive or use heavy equipment or machinery, if you feel sleepy! Patients with sleep apnea can also develop difficulty initiating and maintaining sleep (aka insomnia).   Having sleep apnea may increase your risk for other sleep disorders, including involuntary behaviors sleep such as sleep terrors, sleep talking, sleepwalking.    Having sleep apnea can also increase your risk for restless leg syndrome and leg movements at night.   Please note that untreated obstructive sleep apnea may carry additional perioperative morbidity. Patients with significant obstructive sleep apnea (typically, in the moderate to severe degree) should receive, if possible, perioperative PAP (positive airway pressure) therapy and the surgeons and particularly the anesthesiologists should be informed of the diagnosis and the severity of the sleep disordered breathing.

## 2022-10-22 ENCOUNTER — Encounter: Payer: Self-pay | Admitting: Cardiology

## 2022-10-22 ENCOUNTER — Ambulatory Visit: Payer: PPO | Attending: Cardiology | Admitting: Cardiology

## 2022-10-22 VITALS — BP 100/60 | HR 66 | Ht 63.0 in | Wt 172.0 lb

## 2022-10-22 DIAGNOSIS — I420 Dilated cardiomyopathy: Secondary | ICD-10-CM | POA: Diagnosis not present

## 2022-10-22 DIAGNOSIS — I10 Essential (primary) hypertension: Secondary | ICD-10-CM | POA: Diagnosis not present

## 2022-10-22 DIAGNOSIS — I493 Ventricular premature depolarization: Secondary | ICD-10-CM | POA: Diagnosis not present

## 2022-10-22 DIAGNOSIS — R0609 Other forms of dyspnea: Secondary | ICD-10-CM | POA: Diagnosis not present

## 2022-10-22 DIAGNOSIS — K219 Gastro-esophageal reflux disease without esophagitis: Secondary | ICD-10-CM

## 2022-10-22 NOTE — Addendum Note (Signed)
Addended by: Jacobo Forest D on: 10/22/2022 10:16 AM   Modules accepted: Orders

## 2022-10-22 NOTE — Progress Notes (Signed)
Cardiology Office Note:    Date:  10/22/2022   ID:  Allison Clark, DOB 1950/12/12, MRN [id number]  PCP:  Crist Infante, MD  Cardiologist:  Jenne Campus, MD    Referring MD: Crist Infante, MD   Chief Complaint  Patient presents with   Follow-up  Doing very well  History of Present Illness:    Allison Clark is a 73 y.o. female with past medical history significant for cardiomyopathy which appears to be nonischemic, ejection fraction 20 to 25% but now improved to 45%, left bundle branch block, coronary CT angio showing only minimal disease, therefore this is not ischemic.  She has been put on appropriate guideline directed medical therapy comes to my office for follow-up.  Repeat echocardiogram showed improvement left ventricle ejection fraction which make him very happy.  She is doing much better.  She can do much where she is always on the go.  Denies have any chest pain tightness squeezing pressure burning chest.  No swelling of lower extremities.  Past Medical History:  Diagnosis Date   Asthma    BBB (bundle branch block)    left   Breast cancer ([place]) 2004   Left Breast Cancer   Cardiomyopathy ([place])    GERD (gastroesophageal reflux disease)    Thyroid disease     Past Surgical History:  Procedure Laterality Date   BREAST LUMPECTOMY Left 2004   malignant   BREAST SURGERY     STERILIZATION     THYROIDECTOMY     TONSILLECTOMY     TUBAL LIGATION      Current Medications: Current Meds  Medication Sig   Cholecalciferol (VITAMIN D3 PO) Take 1 tablet by mouth daily.   dapagliflozin propanediol (FARXIGA) 5 MG TABS tablet Take 1 tablet (5 mg total) by mouth daily before breakfast.   EPINEPHrine 0.3 mg/0.3 mL IJ SOAJ injection Use as directed for life threatening allergic reactions   fluticasone (FLONASE) 50 MCG/ACT nasal spray Place 1 spray into both nostrils daily.    ibandronate (BONIVA) 150 MG tablet Take 150 mg by mouth every 30 (thirty) days.   levalbuterol (XOPENEX  HFA) 45 MCG/ACT inhaler INHALE 2 PUFFS BY MOUTH EVERY 4 TO 6 HOURS AS NEEDED FOR COUGH OR WHEEZE   levothyroxine (SYNTHROID, LEVOTHROID) 112 MCG tablet Take 112 mcg by mouth daily before breakfast.    loratadine (CLARITIN) 10 MG tablet Take 10 mg by mouth daily.   metoprolol succinate (TOPROL-XL) 50 MG 24 hr tablet Take 1 tablet (50 mg total) by mouth daily. Take with or immediately following a meal.   omeprazole (PRILOSEC) 40 MG capsule TAKE 1 CAPSULE BY MOUTH 1 TO 2 TIMES PER DAY   sacubitril-valsartan (ENTRESTO) 49-51 MG Take 1 tablet by mouth 2 (two) times daily.   simvastatin (ZOCOR) 20 MG tablet Take 20 mg by mouth at bedtime.    spironolactone (ALDACTONE) 25 MG tablet Take 1 tablet (25 mg total) by mouth daily.     Allergies:   Avelox [moxifloxacin hcl in nacl], Albuterol, Anastrozole, Atorvastatin, Ciclesonide, Ezetimibe, Metronidazole, and Sulfa antibiotics   Social History   Socioeconomic History   Marital status: Married    Spouse name: Not on file   Number of children: Not on file   Years of education: Not on file   Highest education level: Not on file  Occupational History   Not on file  Tobacco Use   Smoking status: Never   Smokeless tobacco: Never  Vaping Use   Vaping Use:  Never used  Substance and Sexual Activity   Alcohol use: No   Drug use: No   Sexual activity: Not Currently  Other Topics Concern   Not on file  Social History Narrative   Not on file   Social Determinants of Health   Financial Resource Strain: Not on file  Food Insecurity: Not on file  Transportation Needs: Not on file  Physical Activity: Not on file  Stress: Not on file  Social Connections: Not on file     Family History: The patient's family history includes Allergies in her son. There is no history of Allergic rhinitis, Angioedema, Asthma, Eczema, Immunodeficiency, Urticaria, Breast cancer, or Sleep apnea. ROS:   Please see the history of present illness.    All 14 point review  of systems negative except as described per history of present illness  EKGs/Labs/Other Studies Reviewed:      Recent Labs: 12/13/2021: ALT 14; NT-Pro BNP 221; TSH 2.990 05/06/2022: BUN 20; Creatinine, Ser 0.79; Potassium 4.6; Sodium 139  Recent Lipid Panel No results found for: "CHOL", "TRIG", "HDL", "CHOLHDL", "VLDL", "LDLCALC", "LDLDIRECT"  Physical Exam:    VS:  BP 100/60 (BP Location: Right Arm, Patient Position: Sitting, Cuff Size: Normal)   Pulse 66   Ht '5\' 3"'$  (1.6 m)   Wt 172 lb (78 kg)   SpO2 92%   BMI 30.47 kg/m     Wt Readings from Last 3 Encounters:  10/22/22 172 lb (78 kg)  10/21/22 173 lb 9.6 oz (78.7 kg)  07/15/22 174 lb 9.6 oz (79.2 kg)     GEN:  Well nourished, well developed in no acute distress HEENT: Normal NECK: No JVD; No carotid bruits LYMPHATICS: No lymphadenopathy CARDIAC: RRR, no murmurs, no rubs, no gallops RESPIRATORY:  Clear to auscultation without rales, wheezing or rhonchi  ABDOMEN: Soft, non-tender, non-distended MUSCULOSKELETAL:  No edema; No deformity  SKIN: Warm and dry LOWER EXTREMITIES: no swelling NEUROLOGIC:  Alert and oriented x 3 PSYCHIATRIC:  Normal affect   ASSESSMENT:    1. Dilated cardiomyopathy ([place])   2. Gastroesophageal reflux disease, unspecified whether esophagitis present   3. Essential hypertension   4. Ventricular premature depolarization    PLAN:    In order of problems listed above:  Cardiomyopathy which is dilated.  Iron studies were negative, thyroid studies normal, coronary CT angio negative.  Will continue present medications which includes guideline directed medical therapy.  I will repeat echocardiogram in April. Gastroesophageal reflux disease: Denies having the symptoms. Essential hypertension blood pressure well-controlled. Dyslipidemia I did review K PN which show LDL 90 HDL 40.  Will continue present management. Overall she is doing very well plan is to see her in April before that we will do  echocardiogram   Medication Adjustments/Labs and Tests Ordered: Current medicines are reviewed at length with the patient today.  Concerns regarding medicines are outlined above.  No orders of the defined types were placed in this encounter.  Medication changes: No orders of the defined types were placed in this encounter.   Signed, Park Liter, MD, [facility] 10/22/2022 10:07 AM    [place]

## 2022-10-22 NOTE — Patient Instructions (Addendum)
Medication Instructions:  Your physician recommends that you continue on your current medications as directed. Please refer to the Current Medication list given to you today.  *If you need a refill on your cardiac medications before your next appointment, please call your pharmacy*   Lab Work: None Ordered If you have labs (blood work) drawn today and your tests are completely normal, you will receive your results only by: [place] (if you have MyChart) OR A paper copy in the mail If you have any lab test that is abnormal or we need to change your treatment, we will call you to review the results.   Testing/Procedures: Your physician has requested that you have an echocardiogram. Echocardiography is a painless test that uses sound waves to create images of your heart. It provides your doctor with information about the size and shape of your heart and how well your heart's chambers and valves are working. This procedure takes approximately one hour. There are no restrictions for this procedure. Please do NOT wear cologne, perfume, aftershave, or lotions (deodorant is allowed). Please arrive 15 minutes prior to your appointment time.    Follow-Up: At [facility], you and your health needs are our priority.  As part of our continuing mission to provide you with exceptional heart care, we have created designated Provider Care Teams.  These Care Teams include your primary Cardiologist (physician) and Advanced Practice Providers (APPs -  Physician Assistants and Nurse Practitioners) who all work together to provide you with the care you need, when you need it.  We recommend signing up for the patient portal called "MyChart".  Sign up information is provided on this After Visit Summary.  MyChart is used to connect with patients for Virtual Visits (Telemedicine).  Patients are able to view lab/test results, encounter notes, upcoming appointments, etc.  Non-urgent messages can be sent to your  provider as well.   To learn more about what you can do with MyChart, go to [email or web address].    Your next appointment:   3 month(s)  The format for your next appointment:   In Person  Provider:   Jenne Campus, MD    Other Instructions NA

## 2022-10-29 DIAGNOSIS — L820 Inflamed seborrheic keratosis: Secondary | ICD-10-CM | POA: Diagnosis not present

## 2022-10-29 DIAGNOSIS — L738 Other specified follicular disorders: Secondary | ICD-10-CM | POA: Diagnosis not present

## 2022-11-06 ENCOUNTER — Ambulatory Visit: Payer: PPO | Admitting: Allergy and Immunology

## 2022-11-06 ENCOUNTER — Encounter: Payer: Self-pay | Admitting: Allergy and Immunology

## 2022-11-06 VITALS — BP 110/64 | HR 66 | Resp 16

## 2022-11-06 DIAGNOSIS — J3089 Other allergic rhinitis: Secondary | ICD-10-CM

## 2022-11-06 DIAGNOSIS — J4550 Severe persistent asthma, uncomplicated: Secondary | ICD-10-CM

## 2022-11-06 DIAGNOSIS — K219 Gastro-esophageal reflux disease without esophagitis: Secondary | ICD-10-CM | POA: Diagnosis not present

## 2022-11-06 MED ORDER — IPRATROPIUM BROMIDE 0.06 % NA SOLN: NASAL | 5 refills | Status: DC

## 2022-11-06 NOTE — Progress Notes (Unsigned)
[place] [facility] [place] [facility] [place]   Follow-up Note  Referring Provider: Crist Infante, MD Primary Provider: Crist Infante, MD Date of Office Visit: 11/06/2022  Subjective:   Allison Clark (DOB: Sep 11, 1951) is a 72 y.o. female who returns to the [facility] on 11/06/2022 in re-evaluation of the following:  HPI: Allison Clark returns to this clinic in evaluation of asthma, allergic rhinitis, LPR.  The [facility] clinic 06 May 2022.  Allison Clark returns to this clinic in evaluation of asthma, allergic rhinitis, LPR.  I last saw her in this clinic 06 May 2022.  She has really done well with her airway and has not had any significant upper or lower airway symptoms while she intermittently uses a nasal steroid.  She has discontinued her benralizumab injections November 2023 and she would like to see how she does without these injections.  She is not using any inhaled steroid at this point.  She has no need to use a short acting bronchodilator and she can exert herself without any problem.  Her reflux is under very good control while using a proton pump inhibitor.  She has obtained the flu vaccine and the RSV vaccine.  Allergies as of 11/06/2022       Reactions   Avelox [moxifloxacin Hcl In Nacl] Hives   Albuterol Other (See Comments)   Anastrozole Other (See Comments)   Atorvastatin Other (See Comments)   Ciclesonide Other (See Comments)   Ezetimibe Other (See Comments)   Metronidazole Other (See Comments)   Sulfa Antibiotics Rash        Medication List    dapagliflozin propanediol 5 MG Tabs tablet Commonly known as: FARXIGA Take 1 tablet (5 mg total) by mouth daily before breakfast.   Entresto 49-51 MG Generic drug: sacubitril-valsartan Take 1 tablet by mouth 2 (two) times daily.   EPINEPHrine 0.3 mg/0.3 mL Soaj injection Commonly known as: EPI-PEN Use as directed for life threatening allergic reactions   fluticasone 50 MCG/ACT nasal  spray Commonly known as: FLONASE Place 1 spray into both nostrils daily.   ibandronate 150 MG tablet Commonly known as: BONIVA Take 150 mg by mouth every 30 (thirty) days.   ipratropium 0.06 % nasal spray Commonly known as: ATROVENT Use 2 sprays in each nostril before each meal. Started by: Cainan Trull Kevan Rosebush, MD   levalbuterol 45 MCG/ACT inhaler Commonly known as: XOPENEX HFA INHALE 2 PUFFS BY MOUTH EVERY 4 TO 6 HOURS AS NEEDED FOR COUGH OR WHEEZE   levothyroxine 112 MCG tablet Commonly known as: SYNTHROID Take 112 mcg by mouth daily before breakfast.   loratadine 10 MG tablet Commonly known as: CLARITIN Take 10 mg by mouth daily.   metoprolol succinate 50 MG 24 hr tablet Commonly known as: TOPROL-XL Take 1 tablet (50 mg total) by mouth daily. Take with or immediately following a meal.   omeprazole 40 MG capsule Commonly known as: PRILOSEC TAKE 1 CAPSULE BY MOUTH 1 TO 2 TIMES PER DAY   simvastatin 20 MG tablet Commonly known as: ZOCOR Take 20 mg by mouth at bedtime.   spironolactone 25 MG tablet Commonly known as: ALDACTONE Take 1 tablet (25 mg total) by mouth daily.   VITAMIN D3 PO Take 1 tablet by mouth daily.    Past Medical History:  Diagnosis Date   Asthma    BBB (bundle branch block)    left   Breast cancer ([place]) 2004   Left Breast Cancer   Cardiomyopathy ([place])  GERD (gastroesophageal reflux disease)    Thyroid disease     Past Surgical History:  Procedure Laterality Date   BREAST LUMPECTOMY Left 2004   malignant   BREAST SURGERY     STERILIZATION     THYROIDECTOMY     TONSILLECTOMY     TUBAL LIGATION      Review of systems negative except as noted in HPI / PMHx or noted below:  Review of Systems  Constitutional: Negative.   HENT: Negative.    Eyes: Negative.   Respiratory: Negative.    Cardiovascular: Negative.   Gastrointestinal: Negative.   Genitourinary: Negative.   Musculoskeletal: Negative.   Skin: Negative.   Neurological:  Negative.   Endo/Heme/Allergies: Negative.   Psychiatric/Behavioral: Negative.       Objective:   Vitals:   11/06/22 1041  BP: 110/64  Pulse: 66  Resp: 16  SpO2: 94%          Physical Exam Constitutional:      Appearance: She is not diaphoretic.  HENT:     Head: Normocephalic.     Right Ear: Tympanic membrane, ear canal and external ear normal.     Left Ear: Tympanic membrane, ear canal and external ear normal.     Nose: Nose normal. No mucosal edema or rhinorrhea.     Mouth/Throat:     Pharynx: Uvula midline. No oropharyngeal exudate.  Eyes:     Conjunctiva/sclera: Conjunctivae normal.  Neck:     Thyroid: No thyromegaly.     Trachea: Trachea normal. No tracheal tenderness or tracheal deviation.  Cardiovascular:     Rate and Rhythm: Normal rate and regular rhythm.     Heart sounds: Normal heart sounds, S1 normal and S2 normal. No murmur heard. Pulmonary:     Effort: No respiratory distress.     Breath sounds: Normal breath sounds. No stridor. No wheezing or rales.  Lymphadenopathy:     Head:     Right side of head: No tonsillar adenopathy.     Left side of head: No tonsillar adenopathy.     Cervical: No cervical adenopathy.  Skin:    Findings: No erythema or rash.     Nails: There is no clubbing.  Neurological:     Mental Status: She is alert.     Diagnostics: none  Assessment and Plan:   1. Asthma, severe persistent, well-controlled   2. Other allergic rhinitis   3. Laryngopharyngeal reflux (LPR)     1.  Continue Omeprazole 40 mg - 1 tablet daily  2. Continue Flonase - 1 spray each nostril 1-2 times per day if needed  3. Continue Claritin if needed  4. START AIRSUPRA - 2 inhalations every 4-6 hours if needed  5. Return in 6 months or earlier if problem  Hopefully Allison Clark will not develop significant inflammation of her airway now that she has discontinued her benralizumab injections.  Will just need to see what happens as we move forward  regarding this issue.  I have given her a combination albuterol/budesonide inhaler to be used as her rescue medicine.  She will continue to address the issue with reflux by using omeprazole.  I will see her back in this clinic in 6 months or earlier if there is a problem.  [name], MD Allergy / Immunology [place]

## 2022-11-06 NOTE — Patient Instructions (Addendum)
  1.  Continue Omeprazole 40 mg - 1 tablet daily  2. Continue Flonase - 1 spray each nostril 1-2 times per day if needed  3. Continue Claritin if needed  4. START AIRSUPRA - 2 inhalations every 4-6 hours if needed  5. Return in 6 months or earlier if problem

## 2022-11-07 ENCOUNTER — Encounter: Payer: Self-pay | Admitting: Allergy and Immunology

## 2022-11-26 ENCOUNTER — Telehealth: Payer: Self-pay | Admitting: Cardiology

## 2022-11-26 NOTE — Telephone Encounter (Signed)
Jessica from [facility] calling needing to know if pt has chronic congestive heart failure or diabetes in order for them to be enrolled in special needs program through their insurance.

## 2022-11-29 ENCOUNTER — Telehealth: Payer: Self-pay

## 2022-11-29 NOTE — Telephone Encounter (Signed)
Called insurance with pts diagnosis of Cardiomyopathy and CHF.

## 2022-12-03 DIAGNOSIS — I509 Heart failure, unspecified: Secondary | ICD-10-CM

## 2022-12-03 HISTORY — DX: Heart failure, unspecified: I50.9

## 2022-12-03 NOTE — Telephone Encounter (Signed)
MacKenzie from [facility] is calling to have the diagnoses that was given on 2/16, faxed to them. Fax number is [phone]. Please advise.

## 2022-12-03 NOTE — Telephone Encounter (Signed)
Letter sent via EPIC to [facility].

## 2023-01-06 ENCOUNTER — Other Ambulatory Visit (HOSPITAL_COMMUNITY): Payer: Self-pay

## 2023-01-06 MED ORDER — METOPROLOL TARTRATE 25 MG PO TABS
ORAL_TABLET | ORAL | 2 refills | Status: DC
Start: 2022-02-07 — End: 2023-01-17
  Filled 2023-01-06: qty 180, 90d supply, fill #0

## 2023-01-06 MED ORDER — IPRATROPIUM BROMIDE 0.06 % NA SOLN
NASAL | 5 refills | Status: DC
  Filled 2023-01-06: qty 15, 30d supply, fill #0
  Filled 2023-03-03: qty 15, 30d supply, fill #1
  Filled 2023-04-28: qty 15, 30d supply, fill #2
  Filled 2023-05-26: qty 15, 30d supply, fill #3
  Filled 2023-09-07: qty 15, 30d supply, fill #4

## 2023-01-07 ENCOUNTER — Other Ambulatory Visit (HOSPITAL_COMMUNITY): Payer: Self-pay

## 2023-01-07 MED ORDER — LOTEPREDNOL ETABONATE 0.5 % OP SUSP
1.0000 [drp] | Freq: Three times a day (TID) | OPHTHALMIC | 0 refills | Status: DC
  Filled 2023-01-07: qty 5, 17d supply, fill #0

## 2023-01-07 MED ORDER — FUROSEMIDE 20 MG PO TABS
20.0000 mg | ORAL_TABLET | Freq: Every day | ORAL | 2 refills | Status: DC
  Filled 2023-01-07: qty 90, 90d supply, fill #0

## 2023-01-07 MED ORDER — ROSUVASTATIN CALCIUM 20 MG PO TABS
20.0000 mg | ORAL_TABLET | Freq: Every day | ORAL | 10 refills | Status: DC
  Filled 2023-01-07: qty 30, 30d supply, fill #0

## 2023-01-07 MED ORDER — SIMVASTATIN 20 MG PO TABS
20.0000 mg | ORAL_TABLET | Freq: Every evening | ORAL | 1 refills | Status: DC
Start: 2022-05-22 — End: 2023-06-09
  Filled 2023-01-07 – 2023-03-03 (×2): qty 90, 90d supply, fill #0

## 2023-01-07 MED FILL — Omeprazole Cap Delayed Release 40 MG: ORAL | 90 days supply | Qty: 180 | Fill #0 | Status: CN

## 2023-01-08 ENCOUNTER — Other Ambulatory Visit (HOSPITAL_COMMUNITY): Payer: Self-pay

## 2023-01-10 ENCOUNTER — Other Ambulatory Visit (HOSPITAL_COMMUNITY): Payer: Self-pay

## 2023-01-14 ENCOUNTER — Ambulatory Visit: Payer: PPO | Attending: Cardiology

## 2023-01-14 DIAGNOSIS — R0609 Other forms of dyspnea: Secondary | ICD-10-CM | POA: Diagnosis not present

## 2023-01-14 LAB — ECHOCARDIOGRAM COMPLETE
Area-P 1/2: 3.65 cm2
S' Lateral: 3.3 cm

## 2023-01-17 ENCOUNTER — Encounter: Payer: Self-pay | Admitting: Cardiology

## 2023-01-17 ENCOUNTER — Telehealth: Payer: Self-pay

## 2023-01-17 ENCOUNTER — Ambulatory Visit: Payer: PPO | Attending: Cardiology | Admitting: Cardiology

## 2023-01-17 VITALS — BP 126/60 | HR 74 | Ht 62.0 in | Wt 176.4 lb

## 2023-01-17 DIAGNOSIS — I10 Essential (primary) hypertension: Secondary | ICD-10-CM | POA: Diagnosis not present

## 2023-01-17 DIAGNOSIS — I5042 Chronic combined systolic (congestive) and diastolic (congestive) heart failure: Secondary | ICD-10-CM

## 2023-01-17 DIAGNOSIS — I420 Dilated cardiomyopathy: Secondary | ICD-10-CM | POA: Diagnosis not present

## 2023-01-17 DIAGNOSIS — E782 Mixed hyperlipidemia: Secondary | ICD-10-CM

## 2023-01-17 NOTE — Telephone Encounter (Signed)
Pt viewed results on My Chart. Routed to PCP.  

## 2023-01-17 NOTE — Telephone Encounter (Signed)
Left message on My Chart with normal results per Dr. Krasowski's note. Routed to PCP. 

## 2023-01-17 NOTE — Patient Instructions (Signed)

## 2023-01-17 NOTE — Progress Notes (Signed)
Cardiology Office Note:    Date:  01/17/2023   ID:  Allison Clark, DOB 22-Oct-1950, MRN [id number]  PCP:  Rodrigo Ran, MD  Cardiologist:  Gypsy Balsam, MD    Referring MD: Rodrigo Ran, MD   Chief Complaint  Patient presents with   Results  Doing well  History of Present Illness:    Allison Clark is a 72 y.o. female past medical history significant for cardiomyopathy which is nonischemic, ejection fraction 20 to 25% with improvement to 45%, chronic left bundle branch block, coronary CT angio showing only minimal disease which does not explain her cardiomyopathy.  He was put on appropriate guideline directed medical therapy her left ventricle ejection fraction is 45 to 50% which is most likely related to left bundle branch block.  She is feeling very well.  She denies have any chest pain tightness squeezing pressure burning chest no palpitations no dizziness no swelling of lower extremities.  Overall doing well.  Past Medical History:  Diagnosis Date   Asthma    BBB (bundle branch block)    left   Breast cancer 2004   Left Breast Cancer   Cardiomyopathy    GERD (gastroesophageal reflux disease)    Thyroid disease     Past Surgical History:  Procedure Laterality Date   BREAST LUMPECTOMY Left 2004   malignant   BREAST SURGERY     STERILIZATION     THYROIDECTOMY     TONSILLECTOMY     TUBAL LIGATION      Current Medications: Current Meds  Medication Sig   Cholecalciferol (VITAMIN D3 PO) Take 1 tablet by mouth daily.   dapagliflozin propanediol (FARXIGA) 5 MG TABS tablet Take 1 tablet (5 mg total) by mouth daily before breakfast.   EPINEPHrine 0.3 mg/0.3 mL IJ SOAJ injection Use as directed for life threatening allergic reactions   fluticasone (FLONASE) 50 MCG/ACT nasal spray Place 1 spray into both nostrils daily.    ibandronate (BONIVA) 150 MG tablet Take 150 mg by mouth every 30 (thirty) days.   ipratropium (ATROVENT) 0.06 % nasal spray Use 2 sprays in each  nostril before meals as directed   levalbuterol (XOPENEX HFA) 45 MCG/ACT inhaler INHALE 2 PUFFS BY MOUTH EVERY 4 TO 6 HOURS AS NEEDED FOR COUGH OR WHEEZE   levothyroxine (SYNTHROID, LEVOTHROID) 112 MCG tablet Take 112 mcg by mouth daily before breakfast.    loratadine (CLARITIN) 10 MG tablet Take 10 mg by mouth daily.   metoprolol succinate (TOPROL-XL) 50 MG 24 hr tablet Take 50 mg by mouth daily. Take with or immediately following a meal.   omeprazole (PRILOSEC) 40 MG capsule TAKE 1 CAPSULE BY MOUTH 1 TO 2 TIMES PER DAY (Patient taking differently: Take by mouth See admin instructions. TAKE 1 CAPSULE BY MOUTH 1 TO 2 TIMES PER DAY)   sacubitril-valsartan (ENTRESTO) 49-51 MG Take 1 tablet by mouth 2 (two) times daily.   simvastatin (ZOCOR) 20 MG tablet Take 1 tablet (20 mg total) by mouth every evening.   spironolactone (ALDACTONE) 25 MG tablet Take 1 tablet (25 mg total) by mouth daily.   [DISCONTINUED] metoprolol succinate (TOPROL-XL) 50 MG 24 hr tablet Take 1 tablet (50 mg total) by mouth daily. Take with or immediately following a meal.     Allergies:   Avelox [moxifloxacin hcl in nacl], Albuterol, Anastrozole, Atorvastatin, Ciclesonide, Ezetimibe, Metronidazole, and Sulfa antibiotics   Social History   Socioeconomic History   Marital status: Married    Spouse name: Not on  file   Number of children: Not on file   Years of education: Not on file   Highest education level: Not on file  Occupational History   Not on file  Tobacco Use   Smoking status: Never   Smokeless tobacco: Never  Vaping Use   Vaping Use: Never used  Substance and Sexual Activity   Alcohol use: No   Drug use: No   Sexual activity: Not Currently  Other Topics Concern   Not on file  Social History Narrative   Not on file   Social Determinants of Health   Financial Resource Strain: Not on file  Food Insecurity: Not on file  Transportation Needs: Not on file  Physical Activity: Not on file  Stress: Not on  file  Social Connections: Not on file     Family History: The patient's family history includes Allergies in her son. There is no history of Allergic rhinitis, Angioedema, Asthma, Eczema, Immunodeficiency, Urticaria, Breast cancer, or Sleep apnea. ROS:   Please see the history of present illness.    All 14 point review of systems negative except as described per history of present illness  EKGs/Labs/Other Studies Reviewed:      Recent Labs: 05/06/2022: BUN 20; Creatinine, Ser 0.79; Potassium 4.6; Sodium 139  Recent Lipid Panel No results found for: "CHOL", "TRIG", "HDL", "CHOLHDL", "VLDL", "LDLCALC", "LDLDIRECT"  Physical Exam:    VS:  BP 126/60 (BP Location: Left Arm, Patient Position: Sitting)   Pulse 74   Ht 5\' 2"  (1.575 m)   Wt 176 lb 6.4 oz (80 kg)   SpO2 94%   BMI 32.26 kg/m     Wt Readings from Last 3 Encounters:  01/17/23 176 lb 6.4 oz (80 kg)  10/22/22 172 lb (78 kg)  10/21/22 173 lb 9.6 oz (78.7 kg)     GEN:  Well nourished, well developed in no acute distress HEENT: Normal NECK: No JVD; No carotid bruits LYMPHATICS: No lymphadenopathy CARDIAC: RRR, no murmurs, no rubs, no gallops RESPIRATORY:  Clear to auscultation without rales, wheezing or rhonchi  ABDOMEN: Soft, non-tender, non-distended MUSCULOSKELETAL:  No edema; No deformity  SKIN: Warm and dry LOWER EXTREMITIES: no swelling NEUROLOGIC:  Alert and oriented x 3 PSYCHIATRIC:  Normal affect   ASSESSMENT:    1. Chronic combined systolic and diastolic congestive heart failure   2. Dilated cardiomyopathy   3. Essential hypertension   4. Mixed hyperlipidemia    PLAN:    In order of problems listed above:  Chronic combined systolic and diastolic congestive heart failure doing very well echocardiogram reviewed ejection fraction stable. Dilated cardiomyopathy.  Improvement details as described above. Essential hypertension: Blood pressure well-controlled continue present management. Dyslipidemia I  did review K PN which show me LDL 90 HDL 40 we will continue present management.   Medication Adjustments/Labs and Tests Ordered: Current medicines are reviewed at length with the patient today.  Concerns regarding medicines are outlined above.  No orders of the defined types were placed in this encounter.  Medication changes: No orders of the defined types were placed in this encounter.   Signed, Georgeanna Lea, MD, [facility] 01/17/2023 3:54 PM    [facility]

## 2023-01-21 DIAGNOSIS — M8589 Other specified disorders of bone density and structure, multiple sites: Secondary | ICD-10-CM | POA: Diagnosis not present

## 2023-01-23 NOTE — Addendum Note (Signed)
Addended by: Baldo Ash D on: 01/23/2023 09:51 AM   Modules accepted: Orders

## 2023-01-28 ENCOUNTER — Other Ambulatory Visit (HOSPITAL_COMMUNITY): Payer: Self-pay

## 2023-01-28 ENCOUNTER — Other Ambulatory Visit: Payer: Self-pay

## 2023-01-28 ENCOUNTER — Encounter: Payer: Self-pay | Admitting: Cardiology

## 2023-01-28 MED ORDER — ENTRESTO 49-51 MG PO TABS
1.0000 | ORAL_TABLET | Freq: Two times a day (BID) | ORAL | 3 refills | Status: DC
  Filled 2023-01-28: qty 180, 90d supply, fill #0
  Filled 2023-04-28: qty 180, 90d supply, fill #1
  Filled 2023-07-31 – 2023-08-01 (×2): qty 180, 90d supply, fill #2
  Filled 2023-10-27: qty 180, 90d supply, fill #3

## 2023-01-28 MED ORDER — DAPAGLIFLOZIN PROPANEDIOL 5 MG PO TABS
5.0000 mg | ORAL_TABLET | Freq: Every day | ORAL | 3 refills | Status: DC
  Filled 2023-01-28: qty 90, 90d supply, fill #0
  Filled 2023-04-28: qty 90, 90d supply, fill #1
  Filled 2023-07-31 – 2023-08-01 (×2): qty 90, 90d supply, fill #2
  Filled 2023-10-27: qty 90, 90d supply, fill #3

## 2023-01-29 ENCOUNTER — Ambulatory Visit: Payer: PPO | Admitting: Cardiology

## 2023-02-04 DIAGNOSIS — Z853 Personal history of malignant neoplasm of breast: Secondary | ICD-10-CM | POA: Diagnosis not present

## 2023-02-04 DIAGNOSIS — M1990 Unspecified osteoarthritis, unspecified site: Secondary | ICD-10-CM | POA: Diagnosis not present

## 2023-02-04 DIAGNOSIS — M8580 Other specified disorders of bone density and structure, unspecified site: Secondary | ICD-10-CM | POA: Diagnosis not present

## 2023-02-04 DIAGNOSIS — I493 Ventricular premature depolarization: Secondary | ICD-10-CM | POA: Diagnosis not present

## 2023-02-04 DIAGNOSIS — E039 Hypothyroidism, unspecified: Secondary | ICD-10-CM | POA: Diagnosis not present

## 2023-02-04 DIAGNOSIS — I420 Dilated cardiomyopathy: Secondary | ICD-10-CM | POA: Diagnosis not present

## 2023-02-04 DIAGNOSIS — Z7689 Persons encountering health services in other specified circumstances: Secondary | ICD-10-CM | POA: Diagnosis not present

## 2023-02-04 DIAGNOSIS — R7301 Impaired fasting glucose: Secondary | ICD-10-CM | POA: Diagnosis not present

## 2023-02-04 DIAGNOSIS — K5790 Diverticulosis of intestine, part unspecified, without perforation or abscess without bleeding: Secondary | ICD-10-CM | POA: Diagnosis not present

## 2023-02-04 DIAGNOSIS — K219 Gastro-esophageal reflux disease without esophagitis: Secondary | ICD-10-CM | POA: Diagnosis not present

## 2023-02-04 DIAGNOSIS — I10 Essential (primary) hypertension: Secondary | ICD-10-CM | POA: Diagnosis not present

## 2023-02-04 DIAGNOSIS — G720 Drug-induced myopathy: Secondary | ICD-10-CM | POA: Diagnosis not present

## 2023-02-04 DIAGNOSIS — J45909 Unspecified asthma, uncomplicated: Secondary | ICD-10-CM | POA: Diagnosis not present

## 2023-02-17 ENCOUNTER — Other Ambulatory Visit: Payer: Self-pay | Admitting: Internal Medicine

## 2023-02-17 DIAGNOSIS — Z1231 Encounter for screening mammogram for malignant neoplasm of breast: Secondary | ICD-10-CM

## 2023-03-03 ENCOUNTER — Other Ambulatory Visit: Payer: Self-pay

## 2023-03-03 ENCOUNTER — Other Ambulatory Visit (HOSPITAL_COMMUNITY): Payer: Self-pay

## 2023-03-03 MED ORDER — IBANDRONATE SODIUM 150 MG PO TABS
150.0000 mg | ORAL_TABLET | ORAL | 2 refills | Status: DC
  Filled 2023-03-03: qty 3, 90d supply, fill #0
  Filled 2023-05-26: qty 3, 90d supply, fill #1
  Filled 2023-09-07: qty 3, 90d supply, fill #2

## 2023-03-04 ENCOUNTER — Other Ambulatory Visit (HOSPITAL_COMMUNITY): Payer: Self-pay

## 2023-03-18 ENCOUNTER — Encounter: Payer: Self-pay | Admitting: Allergy

## 2023-03-18 ENCOUNTER — Ambulatory Visit: Payer: PPO | Admitting: Allergy

## 2023-03-18 VITALS — BP 120/72 | HR 74 | Temp 97.5°F | Resp 18

## 2023-03-18 DIAGNOSIS — J3089 Other allergic rhinitis: Secondary | ICD-10-CM

## 2023-03-18 DIAGNOSIS — K219 Gastro-esophageal reflux disease without esophagitis: Secondary | ICD-10-CM | POA: Diagnosis not present

## 2023-03-18 DIAGNOSIS — J4551 Severe persistent asthma with (acute) exacerbation: Secondary | ICD-10-CM

## 2023-03-18 MED ORDER — IPRATROPIUM-ALBUTEROL 0.5-2.5 (3) MG/3ML IN SOLN
3.0000 mL | Freq: Once | RESPIRATORY_TRACT | Status: AC
  Administered 2023-03-18: 3 mL via RESPIRATORY_TRACT

## 2023-03-18 NOTE — Progress Notes (Signed)
Follow-up Note  RE: DAMETRA HEFFERN MRN: [id number] DOB: 06-25-1951 Date of Office Visit: 03/18/2023   History of present illness: Allison Clark is a 72 y.o. female presenting today for sick visit.  She has a history of asthma, allergic rhinitis and laryngeal pharyngeal reflux.   She was last seen in the office on 11/06/2022 by Dr. Lucie Leather her primary allergist. At that visit she was recommended to start use of airsupra as needed. She started coughing at first was dry but as the week has gone the cough has become more productive.  She states when she coughs now it "sounds really bad".  She states she has had wheezing that she can hear.  She does not some chest tightness and soreness of chest from coughing. She also states she has noted some elevation in heart rate.  No fever, night sweats or chills.  She states symptoms worsen if she exerts herself.  Husband states she has been outside more.  She states the heat seems to affect her more.  He also states they have been going to [employer] and thus exposed to more dust.  She took some mucinex-dm that helped some.   She has airsupra sample inhaler but states has not used it this week at all.  She was trying not to need to come to doctor as she does have appt with Dr Lucie Leather in July.     She was on fasenra last summer and stopped in November as she was under control.  She has essentially not required any asthma maintenance medications this year at all.       Review of systems: Review of Systems  Constitutional: Negative.   HENT: Negative.    Eyes: Negative.   Respiratory:         See HPI  Cardiovascular: Negative.   Gastrointestinal: Negative.   Musculoskeletal: Negative.   Skin: Negative.   Allergic/Immunologic: Negative.   Neurological: Negative.      All other systems negative unless noted above in HPI  Past medical/social/surgical/family history have been reviewed and are unchanged unless specifically indicated below.  No  changes  Medication List: Current Outpatient Medications  Medication Sig Dispense Refill   Cholecalciferol (VITAMIN D3 PO) Take 1 tablet by mouth daily.     dapagliflozin propanediol (FARXIGA) 5 MG TABS tablet Take 1 tablet (5 mg total) by mouth daily before breakfast. 90 tablet 3   EPINEPHrine 0.3 mg/0.3 mL IJ SOAJ injection Use as directed for life threatening allergic reactions 2 each 3   fluticasone (FLONASE) 50 MCG/ACT nasal spray Place 1 spray into both nostrils daily.      ibandronate (BONIVA) 150 MG tablet Take 150 mg by mouth every 30 (thirty) days.     ibandronate (BONIVA) 150 MG tablet Take 1 tablet by mouth once monthly 3 tablet 2   ipratropium (ATROVENT) 0.06 % nasal spray Use 2 sprays in each nostril before meals as directed 15 mL 5   levalbuterol (XOPENEX HFA) 45 MCG/ACT inhaler INHALE 2 PUFFS BY MOUTH EVERY 4 TO 6 HOURS AS NEEDED FOR COUGH OR WHEEZE 75 g 1   levothyroxine (SYNTHROID, LEVOTHROID) 112 MCG tablet Take 112 mcg by mouth daily before breakfast.      loratadine (CLARITIN) 10 MG tablet Take 10 mg by mouth daily.     metoprolol succinate (TOPROL-XL) 50 MG 24 hr tablet Take 50 mg by mouth daily. Take with or immediately following a meal.     omeprazole (PRILOSEC) 40  MG capsule TAKE 1 CAPSULE BY MOUTH 1 TO 2 TIMES PER DAY (Patient taking differently: Take by mouth See admin instructions. TAKE 1 CAPSULE BY MOUTH 1 TO 2 TIMES PER DAY) 180 capsule 0   sacubitril-valsartan (ENTRESTO) 49-51 MG Take 1 tablet by mouth 2 (two) times daily. 180 tablet 3   simvastatin (ZOCOR) 20 MG tablet Take 1 tablet (20 mg total) by mouth every evening. 90 tablet 1   spironolactone (ALDACTONE) 25 MG tablet Take 1 tablet (25 mg total) by mouth daily. 30 tablet 4   No current facility-administered medications for this visit.     Known medication allergies: Allergies  Allergen Reactions   Avelox [Moxifloxacin Hcl In Nacl] Hives   Albuterol Other (See Comments)   Anastrozole Other (See  Comments)   Atorvastatin Other (See Comments)   Ciclesonide Other (See Comments)   Ezetimibe Other (See Comments)   Metronidazole Other (See Comments)   Sulfa Antibiotics Rash     Physical examination: Blood pressure 120/72, pulse 74, temperature (!) 97.5 F (36.4 C), temperature source Oral, resp. rate 18, SpO2 90 %.  General: Alert, interactive, in no acute distress, coughing during encounter. HEENT: PERRLA, TMs pearly gray, turbinates non-edematous without discharge, post-pharynx non erythematous. Neck: Supple without lymphadenopathy. Lungs: Mildly decreased breath sounds bilaterally without wheezing, rhonchi or rales. {no increased work of breathing. CV: Normal S1, S2 without murmurs. Abdomen: Nondistended, nontender. Skin: Warm and dry, without lesions or rashes. Extremities:  No clubbing, cyanosis or edema. Neuro:   Grossly intact.  Diagnositics/Labs: Duoneb given in office due to lung exam and coughing during visit.  Lung exam much improved with clear breath sounds throughout.   Prednisone 20mg  given in office.    Assessment and plan: Severe persistent asthma with exacerbation Allergic rhinitis Laryngeal pharyngeal reflux   1.  Continue Omeprazole 40 mg - 1 tablet daily  2. Continue Flonase - 1 spray each nostril 1-2 times per day if needed  3. Continue Claritin if needed  4. Use AIRSUPRA - 2 inhalations every 4-6 hours for the next 2 days then return to as needed use  5. Take Prednisone 20mg  twice a day for next 5 days  6. Ok to use Mucinex DM to help loosen mucus and help with cough.  Drink plenty of water.    Keep July 2024 appointment with Dr Lucie Leather  I appreciate the opportunity to take part in Norelle's care. Please do not hesitate to contact me with questions.  Sincerely,   Margo Aye, MD Allergy/Immunology [facility]

## 2023-03-18 NOTE — Patient Instructions (Addendum)
  1.  Continue Omeprazole 40 mg - 1 tablet daily  2. Continue Flonase - 1 spray each nostril 1-2 times per day if needed  3. Continue Claritin if needed  4. Use AIRSUPRA - 2 inhalations every 4-6 hours for the next 2 days then return to as needed use  5. Take Prednisone 20mg  twice a day for next 5 days  6. Ok to use Mucinex DM to help loosen mucus and help with cough.  Drink plenty of water.    Keep July 2024 appointment with Dr Lucie Leather

## 2023-03-31 ENCOUNTER — Ambulatory Visit: Payer: PPO

## 2023-04-08 ENCOUNTER — Telehealth: Payer: Self-pay | Admitting: Cardiology

## 2023-04-08 ENCOUNTER — Other Ambulatory Visit: Payer: Self-pay | Admitting: Cardiology

## 2023-04-08 ENCOUNTER — Other Ambulatory Visit: Payer: Self-pay

## 2023-04-08 ENCOUNTER — Other Ambulatory Visit (HOSPITAL_COMMUNITY): Payer: Self-pay

## 2023-04-08 MED ORDER — METOPROLOL SUCCINATE ER 50 MG PO TB24
50.0000 mg | ORAL_TABLET | Freq: Every day | ORAL | 3 refills | Status: DC
  Filled 2023-04-08: qty 90, 90d supply, fill #0
  Filled 2023-07-07: qty 90, 90d supply, fill #1
  Filled 2023-09-30: qty 90, 90d supply, fill #2
  Filled 2023-12-29: qty 90, 90d supply, fill #3

## 2023-04-08 MED FILL — Omeprazole Cap Delayed Release 40 MG: ORAL | 90 days supply | Qty: 180 | Fill #0 | Status: AC

## 2023-04-08 NOTE — Telephone Encounter (Signed)
*  STAT* If patient is at the pharmacy, call can be transferred to refill team.   1. Which medications need to be refilled? (please list name of each medication and dose if known) metoprolol succinate (TOPROL-XL) 50 MG 24 hr tablet   2. Which pharmacy/location (including street and city if local pharmacy) is medication to be sent to? [place] - [facility]   3. Do they need a 30 day or 90 day supply? 90

## 2023-04-23 ENCOUNTER — Ambulatory Visit
Admission: RE | Admit: 2023-04-23 | Discharge: 2023-04-23 | Disposition: A | Discharge status: Home / Self Care | Payer: PPO | Source: Ambulatory Visit | Attending: Internal Medicine | Admitting: Internal Medicine

## 2023-04-23 DIAGNOSIS — Z1231 Encounter for screening mammogram for malignant neoplasm of breast: Secondary | ICD-10-CM

## 2023-04-28 ENCOUNTER — Other Ambulatory Visit (HOSPITAL_COMMUNITY): Payer: Self-pay

## 2023-04-28 ENCOUNTER — Other Ambulatory Visit: Payer: Self-pay

## 2023-05-07 ENCOUNTER — Ambulatory Visit: Payer: PPO | Admitting: Allergy and Immunology

## 2023-05-07 VITALS — BP 118/64 | HR 78 | Resp 18

## 2023-05-07 DIAGNOSIS — J4550 Severe persistent asthma, uncomplicated: Secondary | ICD-10-CM

## 2023-05-07 DIAGNOSIS — J3089 Other allergic rhinitis: Secondary | ICD-10-CM

## 2023-05-07 DIAGNOSIS — K219 Gastro-esophageal reflux disease without esophagitis: Secondary | ICD-10-CM

## 2023-05-07 NOTE — Patient Instructions (Signed)
  1. Continue Omeprazole 40 mg - 1 tablet daily  2. Continue Flonase - 1 spray each nostril 1-2 times per day if needed  3. Continue Claritin if needed  4. Continue AIRSUPRA - 2 inhalations every 4-6 hours if needed  5. Return in 6 months or earlier if problem  6. Plan for fall flu vaccine

## 2023-05-07 NOTE — Progress Notes (Unsigned)
[place] - [place] - [place] - [place] - [place]   Follow-up Note  Referring Provider: Rodrigo Ran, MD Primary Provider: Rodrigo Ran, MD Date of Office Visit: 05/07/2023  Subjective:   Allison Clark (DOB: 01-18-1951) is a 72 y.o. female who [facility] in re-evaluation of the following:  HPI: Allison Clark returns to this clinic in evaluation of asthma and allergic rhinitis and LPR.  I last saw her in this clinic 06 November 2022.  She did visit with Dr. Delorse Lek on 18 March 2023 for what appeared to be a viral induced respiratory tract flareup requiring the administration of a systemic steroid and fortunately she resolved that issue and at this point in time she is doing very well and does not have any respiratory tract symptoms involving either her head or her chest.  She is not using any controller agents for asthma and rarely uses a short acting bronchodilator.  She does continue on Flonase.  She believes that her reflux is under very good control while using her omeprazole on a consistent basis.  Allergies as of 05/07/2023       Reactions   Avelox [moxifloxacin Hcl In Nacl] Hives   Sulfa Antibiotics Rash        Medication List    Entresto 49-51 MG Generic drug: sacubitril-valsartan Take 1 tablet by mouth 2 (two) times daily.   Farxiga 5 MG Tabs tablet Generic drug: dapagliflozin propanediol Take 1 tablet (5 mg total) by mouth daily before breakfast.   fluticasone 50 MCG/ACT nasal spray Commonly known as: FLONASE Place 1 spray into both nostrils daily.   ibandronate 150 MG tablet Commonly known as: BONIVA Take 1 tablet by mouth once monthly   ipratropium 0.06 % nasal spray Commonly known as: ATROVENT Use 2 sprays in each nostril before meals as directed   levalbuterol 45 MCG/ACT inhaler Commonly known as: XOPENEX HFA INHALE 2 PUFFS BY MOUTH EVERY 4 TO 6 HOURS AS NEEDED FOR COUGH OR WHEEZE   levothyroxine 112  MCG tablet Commonly known as: SYNTHROID Take 112 mcg by mouth daily before breakfast.   loratadine 10 MG tablet Commonly known as: CLARITIN Take 10 mg by mouth daily.   metoprolol succinate 50 MG 24 hr tablet Commonly known as: TOPROL-XL Take 1 tablet (50 mg total) by mouth daily. Take with or immediately following a meal.   omeprazole 40 MG capsule Commonly known as: PRILOSEC TAKE 1 CAPSULE BY MOUTH 1 TO 2 TIMES PER DAY   simvastatin 20 MG tablet Commonly known as: ZOCOR Take 1 tablet (20 mg total) by mouth every evening.   spironolactone 25 MG tablet Commonly known as: ALDACTONE Take 1 tablet (25 mg total) by mouth daily.   VITAMIN D3 PO Take 1 tablet by mouth daily.    Past Medical History:  Diagnosis Date   Asthma    BBB (bundle branch block)    left   Breast cancer (HCC) 2004   Left Breast Cancer   Cardiomyopathy (HCC)    GERD (gastroesophageal reflux disease)    Thyroid disease     Past Surgical History:  Procedure Laterality Date   BREAST LUMPECTOMY Left 2004   malignant   BREAST SURGERY     STERILIZATION     THYROIDECTOMY     TONSILLECTOMY     TUBAL LIGATION      Review of systems negative except as noted in HPI / PMHx or noted below:  Review of Systems  Constitutional: Negative.   HENT: Negative.    Eyes: Negative.   Respiratory: Negative.    Cardiovascular: Negative.   Gastrointestinal: Negative.   Genitourinary: Negative.   Musculoskeletal: Negative.   Skin: Negative.   Neurological: Negative.   Endo/Heme/Allergies: Negative.   Psychiatric/Behavioral: Negative.       Objective:   Vitals:   05/07/23 1047  BP: 118/64  Pulse: 78  Resp: 18  SpO2: 94%          Physical Exam Constitutional:      Appearance: She is not diaphoretic.  HENT:     Head: Normocephalic.     Right Ear: Tympanic membrane, ear canal and external ear normal.     Left Ear: Tympanic membrane, ear canal and external ear normal.     Nose: Nose normal. No  mucosal edema or rhinorrhea.     Mouth/Throat:     Pharynx: Uvula midline. No oropharyngeal exudate.  Eyes:     Conjunctiva/sclera: Conjunctivae normal.  Neck:     Thyroid: No thyromegaly.     Trachea: Trachea normal. No tracheal tenderness or tracheal deviation.  Cardiovascular:     Rate and Rhythm: Normal rate and regular rhythm.     Heart sounds: Normal heart sounds, S1 normal and S2 normal. No murmur heard. Pulmonary:     Effort: No respiratory distress.     Breath sounds: Normal breath sounds. No stridor. No wheezing or rales.  Lymphadenopathy:     Head:     Right side of head: No tonsillar adenopathy.     Left side of head: No tonsillar adenopathy.     Cervical: No cervical adenopathy.  Skin:    Findings: No erythema or rash.     Nails: There is no clubbing.  Neurological:     Mental Status: She is alert.     Diagnostics: Spirometry was performed and demonstrated an FEV1 of 1.46 at 73 % of predicted.   Assessment and Plan:   1. Asthma, severe persistent, well-controlled   2. Other allergic rhinitis   3. Laryngopharyngeal reflux (LPR)    1. Continue Omeprazole 40 mg - 1 tablet daily  2. Continue Flonase - 1 spray each nostril 1-2 times per day if needed  3. Continue Claritin if needed  4. Continue AIRSUPRA - 2 inhalations every 4-6 hours if needed  5. Return in 6 months or earlier if problem  6. Plan for fall flu vaccine  Allison Clark appears to be doing pretty well with minimal amounts of medications at this point in time and we are not going to reintroduce any anti-inflammatory agents for her airway unless she becomes more symptomatic or her requirement for a bronchodilator is reinitiated.  She will continue on Flonase for her upper airway and she will continue on omeprazole for reflux.  Assuming she does well with this plan I will see her back in this clinic in 6 months or earlier if there is a problem.   Laurette Schimke, MD Allergy / Immunology [facility]

## 2023-05-08 ENCOUNTER — Other Ambulatory Visit (HOSPITAL_COMMUNITY): Payer: Self-pay

## 2023-05-08 ENCOUNTER — Other Ambulatory Visit: Payer: Self-pay | Admitting: Cardiology

## 2023-05-08 ENCOUNTER — Encounter: Payer: Self-pay | Admitting: Allergy and Immunology

## 2023-05-08 MED ORDER — SPIRONOLACTONE 25 MG PO TABS
25.0000 mg | ORAL_TABLET | Freq: Every day | ORAL | 4 refills | Status: DC
  Filled 2023-05-08: qty 30, 30d supply, fill #0
  Filled 2023-06-09: qty 30, 30d supply, fill #1
  Filled 2023-07-07: qty 30, 30d supply, fill #2
  Filled 2023-07-31 – 2023-08-01 (×2): qty 30, 30d supply, fill #3
  Filled 2023-09-07: qty 30, 30d supply, fill #4

## 2023-06-09 ENCOUNTER — Other Ambulatory Visit: Payer: Self-pay

## 2023-06-09 ENCOUNTER — Other Ambulatory Visit (HOSPITAL_COMMUNITY): Payer: Self-pay

## 2023-06-09 MED ORDER — SIMVASTATIN 20 MG PO TABS
20.0000 mg | ORAL_TABLET | Freq: Every evening | ORAL | 1 refills | Status: DC
  Filled 2023-06-09: qty 90, 90d supply, fill #0
  Filled 2023-09-30: qty 90, 90d supply, fill #1

## 2023-06-10 ENCOUNTER — Other Ambulatory Visit (HOSPITAL_COMMUNITY): Payer: Self-pay

## 2023-07-07 ENCOUNTER — Other Ambulatory Visit: Payer: Self-pay | Admitting: Allergy and Immunology

## 2023-07-07 ENCOUNTER — Other Ambulatory Visit (HOSPITAL_COMMUNITY): Payer: Self-pay

## 2023-07-07 ENCOUNTER — Other Ambulatory Visit: Payer: Self-pay

## 2023-07-07 MED ORDER — OMEPRAZOLE 40 MG PO CPDR
DELAYED_RELEASE_CAPSULE | ORAL | 5 refills | Status: DC
  Filled 2023-07-07: qty 60, 30d supply, fill #0
  Filled 2023-09-30: qty 180, 90d supply, fill #1
  Filled 2024-05-31: qty 120, 60d supply, fill #2

## 2023-07-10 ENCOUNTER — Other Ambulatory Visit (HOSPITAL_COMMUNITY): Payer: Self-pay

## 2023-08-01 ENCOUNTER — Other Ambulatory Visit (HOSPITAL_COMMUNITY): Payer: Self-pay

## 2023-08-07 ENCOUNTER — Encounter: Payer: Self-pay | Admitting: Cardiology

## 2023-08-08 ENCOUNTER — Ambulatory Visit: Payer: PPO | Attending: Cardiology | Admitting: Cardiology

## 2023-08-08 VITALS — BP 98/60 | HR 71 | Ht 63.0 in | Wt 171.6 lb

## 2023-08-08 DIAGNOSIS — I10 Essential (primary) hypertension: Secondary | ICD-10-CM

## 2023-08-08 DIAGNOSIS — I5042 Chronic combined systolic (congestive) and diastolic (congestive) heart failure: Secondary | ICD-10-CM | POA: Diagnosis not present

## 2023-08-08 DIAGNOSIS — I420 Dilated cardiomyopathy: Secondary | ICD-10-CM | POA: Diagnosis not present

## 2023-08-08 DIAGNOSIS — E782 Mixed hyperlipidemia: Secondary | ICD-10-CM | POA: Diagnosis not present

## 2023-08-08 NOTE — Progress Notes (Unsigned)
Cardiology Office Note:    Date:  08/08/2023   ID:  Allison Clark, DOB 1950/11/22, MRN [id number]  PCP:  Rodrigo Ran, MD  Cardiologist:  Gypsy Balsam, MD    Referring MD: Rodrigo Ran, MD   Chief Complaint  Patient presents with   Medication Management    Aldactone, if plan to remain on this, she want a 90 day supply    History of Present Illness:    Allison Clark is a 72 y.o. female with past medical history significant for cardiomyopathy initially diagnosed with 2025% but then after guideline directed medical therapy improvement to 4550, also chronic left bundle branch block.  She did have coronary CT which showed only minimal disease clearly does not explain her cardiomyopathy.  She is doing well comes today to my office denies have any chest pain tightness squeezing pressure burning chest.  Does what she wants with no difficulties.  Past Medical History:  Diagnosis Date   Acute pharyngitis 09/01/2009   Acute upper respiratory infection 12/13/2021   Anemia 12/13/2021   ARF (acute renal failure) (HCC) 06/18/2012   Arthritis 04/09/2021   Asthma    Atypical chest pain 04/08/2022   BBB (bundle branch block)    left   Breast cancer (HCC) 2004   Left Breast Cancer   C. difficile colitis 06/21/2012   Cardiomyopathy (HCC)    Congestive heart failure (CHF) (HCC) 12/03/2022   Dilated cardiomyopathy (HCC) 12/13/2021   Disorder of bone 01/25/2010   Diverticula of intestine 04/25/2020   Diverticulitis 06/14/2012   Diverticulitis of colon 07/01/2012   Drug-induced myopathy 12/13/2021   Encounter for general adult medical examination without abnormal findings 12/18/2015   Enterocolitis due to Clostridium difficile 07/01/2012   Essential hypertension 10/14/1998   Fatigue 01/25/2010   Fever 03/20/2010   GERD 04/10/2010   Qualifier: Diagnosis of   By: Delton Coombes MD, [place]        GERD (gastroesophageal reflux disease)    Hardening of the aorta (main artery of the heart)  (HCC) 12/13/2021   Healthcare-associated pneumonia 06/14/2012   Hyperlipidemia 04/25/2020   Hypertrophic condition of skin 04/11/2015   Hypothyroidism 01/25/2010   Impacted cerumen 12/13/2021   Impaired fasting glucose 04/25/2020   Laryngopharyngeal reflux (LPR) 07/17/2015   Has been doing well while using just Dexilant 60mg  daily without any ranitidine in the evening. However since her flare of asthma she has has some reflux event up to her chest.      Left lower quadrant pain 05/15/2016   Liver cyst 12/13/2021   Malignant neoplasm of female breast (HCC) 04/10/2010   Qualifier: History of   By: Delton Coombes MD, [place]        Mild persistent asthma 07/17/2015   Presents today with a three day history of wheezing, coughing, irritated throat, and SABA use as a result of Cat exposure on Friday. No chills, no fever, some sputum that may be slightly tinged yellow color on rare occasion. Was doing wonderful with any exacerbation in a year and minimal requirement for a bronchodilator until this event.     Neck pain 02/28/2017   Obesity 07/01/2012   Osteopenia 12/13/2021   Pain in left arm 03/17/2019   Pain in left hip 05/15/2016   Personal history of malignant neoplasm of breast 12/13/2021   Proteinuria 04/27/2020   Seasonal allergic rhinitis 01/25/2010   Stable AR without much problem this year while using her flonase intermittently      Severe persistent asthma without  complication 07/26/2020   Thyroid disease    Underimmunization status 07/01/2012   Ventricular premature depolarization 07/01/2011    Past Surgical History:  Procedure Laterality Date   BREAST LUMPECTOMY Left 2004   malignant   BREAST SURGERY     STERILIZATION     THYROIDECTOMY     TONSILLECTOMY     TUBAL LIGATION      Current Medications: Current Meds  Medication Sig   Albuterol-Budesonide (AIRSUPRA) 90-80 MCG/ACT AERO Inhale 1 puff into the lungs as needed (SOb).   Cholecalciferol (VITAMIN D3 PO) Take 1 tablet by  mouth daily.   dapagliflozin propanediol (FARXIGA) 5 MG TABS tablet Take 1 tablet (5 mg total) by mouth daily before breakfast.   fluticasone (FLONASE) 50 MCG/ACT nasal spray Place 1 spray into both nostrils daily.    ibandronate (BONIVA) 150 MG tablet Take 1 tablet by mouth once monthly   ipratropium (ATROVENT) 0.06 % nasal spray Use 2 sprays in each nostril before meals as directed (Patient taking differently: Place 2 sprays into both nostrils 4 (four) times daily.)   levothyroxine (SYNTHROID, LEVOTHROID) 112 MCG tablet Take 112 mcg by mouth daily before breakfast.    loratadine (CLARITIN) 10 MG tablet Take 10 mg by mouth daily.   metoprolol succinate (TOPROL-XL) 50 MG 24 hr tablet Take 1 tablet (50 mg total) by mouth daily. Take with or immediately following a meal.   omeprazole (PRILOSEC) 40 MG capsule TAKE 1 CAPSULE BY MOUTH 1 TO 2 TIMES PER DAY (Patient taking differently: Take 40 mg by mouth daily. TAKE 1 CAPSULE BY MOUTH 1 TO 2 TIMES PER DAY)   sacubitril-valsartan (ENTRESTO) 49-51 MG Take 1 tablet by mouth 2 (two) times daily.   simvastatin (ZOCOR) 20 MG tablet Take 1 tablet (20 mg total) by mouth every evening.   spironolactone (ALDACTONE) 25 MG tablet Take 1 tablet (25 mg total) by mouth daily.   [DISCONTINUED] levalbuterol (XOPENEX HFA) 45 MCG/ACT inhaler INHALE 2 PUFFS BY MOUTH EVERY 4 TO 6 HOURS AS NEEDED FOR COUGH OR WHEEZE (Patient taking differently: Inhale 2 puffs into the lungs every 6 (six) hours as needed for wheezing or shortness of breath. INHALE 2 PUFFS BY MOUTH EVERY 4 TO 6 HOURS AS NEEDED FOR COUGH OR WHEEZE)     Allergies:   Avelox [moxifloxacin hcl in nacl] and Sulfa antibiotics   Social History   Socioeconomic History   Marital status: Married    Spouse name: Not on file   Number of children: Not on file   Years of education: Not on file   Highest education level: Not on file  Occupational History   Not on file  Tobacco Use   Smoking status: Never   Smokeless  tobacco: Never  Vaping Use   Vaping status: Never Used  Substance and Sexual Activity   Alcohol use: No   Drug use: No   Sexual activity: Not Currently  Other Topics Concern   Not on file  Social History Narrative   Not on file   Social Determinants of Health   Financial Resource Strain: Not on file  Food Insecurity: Not on file  Transportation Needs: Not on file  Physical Activity: Not on file  Stress: Not on file  Social Connections: Not on file     Family History: The patient's family history includes Allergies in her son. There is no history of Allergic rhinitis, Angioedema, Asthma, Eczema, Immunodeficiency, Urticaria, Breast cancer, or Sleep apnea. ROS:   Please see the history  of present illness.    All 14 point review of systems negative except as described per history of present illness  EKGs/Labs/Other Studies Reviewed:         Recent Labs: No results found for requested labs within last 365 days.  Recent Lipid Panel No results found for: "CHOL", "TRIG", "HDL", "CHOLHDL", "VLDL", "LDLCALC", "LDLDIRECT"  Physical Exam:    VS:  BP 98/60 (BP Location: Left Arm, Patient Position: Sitting)   Pulse 71   Ht 5\' 3"  (1.6 m)   Wt 171 lb 9.6 oz (77.8 kg)   SpO2 93%   BMI 30.40 kg/m     Wt Readings from Last 3 Encounters:  08/08/23 171 lb 9.6 oz (77.8 kg)  01/17/23 176 lb 6.4 oz (80 kg)  10/22/22 172 lb (78 kg)     GEN:  Well nourished, well developed in no acute distress HEENT: Normal NECK: No JVD; No carotid bruits LYMPHATICS: No lymphadenopathy CARDIAC: RRR, no murmurs, no rubs, no gallops RESPIRATORY:  Clear to auscultation without rales, wheezing or rhonchi  ABDOMEN: Soft, non-tender, non-distended MUSCULOSKELETAL:  No edema; No deformity  SKIN: Warm and dry LOWER EXTREMITIES: no swelling NEUROLOGIC:  Alert and oriented x 3 PSYCHIATRIC:  Normal affect   ASSESSMENT:    1. Dilated cardiomyopathy (HCC)   2. Chronic combined systolic and diastolic  congestive heart failure (HCC)   3. Essential hypertension   4. Mixed hyperlipidemia    PLAN:    In order of problems listed above:  Dilated cardiomyopathy: Doing well from that point review clinically stable.  Guideline directed medical therapy which I will continue.  I will schedule to have another echocardiogram done in April. Essential hypertension blood pressure well-controlled continue present management. Mixed dyslipidemia I did review her K PN which show me HDL 40 LDL at 90 this is from last year we will make arrangements for fasting lipid profile to be redone.   Medication Adjustments/Labs and Tests Ordered: Current medicines are reviewed at length with the patient today.  Concerns regarding medicines are outlined above.  No orders of the defined types were placed in this encounter.  Medication changes: No orders of the defined types were placed in this encounter.   Signed, Georgeanna Lea, MD, [facility] 08/08/2023 11:41 AM    [facility]

## 2023-08-08 NOTE — Patient Instructions (Addendum)
Medication Instructions:  Your physician recommends that you continue on your current medications as directed. Please refer to the Current Medication list given to you today.  *If you need a refill on your cardiac medications before your next appointment, please call your pharmacy*   Lab Work: None Ordered If you have labs (blood work) drawn today and your tests are completely normal, you will receive your results only by: MyChart Message (if you have MyChart) OR A paper copy in the mail If you have any lab test that is abnormal or we need to change your treatment, we will call you to review the results.   Testing/Procedures: Your physician has requested that you have an echocardiogram. Echocardiography is a painless test that uses sound waves to create images of your heart. It provides your doctor with information about the size and shape of your heart and how well your heart's chambers and valves are working. This procedure takes approximately one hour. There are no restrictions for this procedure. Please do NOT wear cologne, perfume, aftershave, or lotions (deodorant is allowed). Please arrive 15 minutes prior to your appointment time.      Follow-Up: At [facility], you and your health needs are our priority.  As part of our continuing mission to provide you with exceptional heart care, we have created designated Provider Care Teams.  These Care Teams include your primary Cardiologist (physician) and Advanced Practice Providers (APPs -  Physician Assistants and Nurse Practitioners) who all work together to provide you with the care you need, when you need it.  We recommend signing up for the patient portal called "MyChart".  Sign up information is provided on this After Visit Summary.  MyChart is used to connect with patients for Virtual Visits (Telemedicine).  Patients are able to view lab/test results, encounter notes, upcoming appointments, etc.  Non-urgent messages can be sent to  your provider as well.   To learn more about what you can do with MyChart, go to [email or web address].    Your next appointment:   6 month(s)  The format for your next appointment:   In Person  Provider:   Robert Krasowski, MD    Other Instructions NA  

## 2023-08-22 ENCOUNTER — Other Ambulatory Visit (HOSPITAL_COMMUNITY): Payer: Self-pay

## 2023-09-09 ENCOUNTER — Other Ambulatory Visit (HOSPITAL_COMMUNITY): Payer: Self-pay

## 2023-09-30 ENCOUNTER — Other Ambulatory Visit: Payer: Self-pay

## 2023-09-30 ENCOUNTER — Other Ambulatory Visit: Payer: Self-pay | Admitting: Allergy and Immunology

## 2023-09-30 ENCOUNTER — Encounter: Payer: Self-pay | Admitting: Cardiology

## 2023-09-30 ENCOUNTER — Other Ambulatory Visit (HOSPITAL_COMMUNITY): Payer: Self-pay

## 2023-09-30 MED ORDER — SPIRONOLACTONE 25 MG PO TABS
25.0000 mg | ORAL_TABLET | Freq: Every day | ORAL | 3 refills | Status: DC
  Filled 2023-09-30: qty 90, 90d supply, fill #0
  Filled 2023-12-29: qty 90, 90d supply, fill #1

## 2023-09-30 MED ORDER — NIRMATRELVIR&RITONAVIR 300/100 20 X 150 MG & 10 X 100MG PO TBPK
ORAL_TABLET | ORAL | 0 refills | Status: DC
  Filled 2023-09-30: qty 30, 5d supply, fill #0

## 2023-09-30 MED ORDER — IPRATROPIUM BROMIDE 0.06 % NA SOLN
NASAL | 1 refills | Status: DC
  Filled 2023-09-30: qty 15, 30d supply, fill #0

## 2023-10-01 ENCOUNTER — Other Ambulatory Visit (HOSPITAL_COMMUNITY): Payer: Self-pay

## 2023-10-01 MED ORDER — SYNTHROID 112 MCG PO TABS
112.0000 ug | ORAL_TABLET | Freq: Every day | ORAL | 3 refills | Status: DC
  Filled 2023-10-01: qty 90, 90d supply, fill #0
  Filled 2023-12-29: qty 90, 90d supply, fill #1
  Filled 2024-03-29: qty 90, 90d supply, fill #2
  Filled 2024-06-28: qty 90, 90d supply, fill #3

## 2023-10-02 ENCOUNTER — Other Ambulatory Visit (HOSPITAL_COMMUNITY): Payer: Self-pay

## 2023-10-30 ENCOUNTER — Other Ambulatory Visit (HOSPITAL_COMMUNITY): Payer: Self-pay

## 2023-11-05 ENCOUNTER — Ambulatory Visit: Payer: PPO | Admitting: Allergy and Immunology

## 2023-11-10 DIAGNOSIS — R82998 Other abnormal findings in urine: Secondary | ICD-10-CM | POA: Diagnosis not present

## 2023-11-13 ENCOUNTER — Ambulatory Visit: Payer: PPO | Admitting: Allergy and Immunology

## 2023-11-13 ENCOUNTER — Encounter: Payer: Self-pay | Admitting: Allergy and Immunology

## 2023-11-13 VITALS — BP 118/62 | HR 63 | Resp 16 | Ht 63.0 in | Wt 176.2 lb

## 2023-11-13 DIAGNOSIS — T63481D Toxic effect of venom of other arthropod, accidental (unintentional), subsequent encounter: Secondary | ICD-10-CM | POA: Diagnosis not present

## 2023-11-13 DIAGNOSIS — T63481A Toxic effect of venom of other arthropod, accidental (unintentional), initial encounter: Secondary | ICD-10-CM

## 2023-11-13 DIAGNOSIS — J3089 Other allergic rhinitis: Secondary | ICD-10-CM | POA: Diagnosis not present

## 2023-11-13 DIAGNOSIS — J4550 Severe persistent asthma, uncomplicated: Secondary | ICD-10-CM

## 2023-11-13 DIAGNOSIS — K219 Gastro-esophageal reflux disease without esophagitis: Secondary | ICD-10-CM | POA: Diagnosis not present

## 2023-11-13 NOTE — Patient Instructions (Addendum)
  1. Continue Omeprazole 40 mg - 1 tablet daily  2. If needed:  A. Claritin 10 mg - 1 tablet 1 time per day B. AIRSUPRA - 2 inhalations every 4-6 hours  C. Flonase - 1 spray each nostril 1-2 times per day  3. Return in 6 months or earlier if problem  4. Influenza = Tamiflu. Covid = Paxlovid  5. If swelling reaction distant from sting site will require further evaluation.

## 2023-11-13 NOTE — Progress Notes (Signed)
[place] - [place] - [place] - [place] - [place]   Follow-up Note  Referring Provider: Rodrigo Ran, MD Primary Provider: Rodrigo Ran, MD Date of Office Visit: 11/13/2023  Subjective:   Allison Clark (DOB: Aug 30, 1951) is a 73 y.o. female who [facility] in re-evaluation of the following:  HPI: [place] returns to this clinic in evaluation of asthma, allergic rhinitis, LPR, history of dilated cardiomyopathy.  I last saw her in this clinic 07 May 2023.  She has had an excellent interval of time regarding her respiratory tract and she rarely uses Airsupra and has no significant upper or lower respiratory tract symptoms and has not required a systemic steroid or an antibiotic for any type of airway issue.  She does use Flonase on a pretty consistent basis.  Her reflux is under excellent control while using omeprazole.  She has obtained this years flu vaccine.  She did contract COVID December 2024, treated with Paxlovid, with minimal sequela.  She was stung on her neck and hand by a flying hymenoptera and developed a large local reaction without any associated systemic or constitutional symptoms.  Allergies as of 11/13/2023       Reactions   Avelox [moxifloxacin Hcl In Nacl] Hives   Sulfa Antibiotics Rash        Medication List    Airsupra 90-80 MCG/ACT Aero Generic drug: Albuterol-Budesonide Inhale 1 puff into the lungs as needed (SOb).   Entresto 49-51 MG Generic drug: sacubitril-valsartan Take 1 tablet by mouth 2 (two) times daily.   Farxiga 5 MG Tabs tablet Generic drug: dapagliflozin propanediol Take 1 tablet (5 mg total) by mouth daily before breakfast.   fluticasone 50 MCG/ACT nasal spray Commonly known as: FLONASE Place 1 spray into both nostrils daily.   ibandronate 150 MG tablet Commonly known as: BONIVA Take 1 tablet by mouth once monthly   Synthroid 112 MCG tablet Generic drug: levothyroxine Take  1 tablet (112 mcg total) by mouth daily.   levothyroxine 112 MCG tablet Commonly known as: SYNTHROID Take 112 mcg by mouth daily before breakfast.   loratadine 10 MG tablet Commonly known as: CLARITIN Take 10 mg by mouth daily.   metoprolol succinate 50 MG 24 hr tablet Commonly known as: TOPROL-XL Take 1 tablet (50 mg total) by mouth daily. Take with or immediately following a meal.   omeprazole 40 MG capsule Commonly known as: PRILOSEC TAKE 1 CAPSULE BY MOUTH 1 TO 2 TIMES PER DAY   simvastatin 20 MG tablet Commonly known as: ZOCOR Take 1 tablet (20 mg total) by mouth every evening.   spironolactone 25 MG tablet Commonly known as: ALDACTONE Take 1 tablet (25 mg total) by mouth daily.   VITAMIN D3 PO Take 1 tablet by mouth daily.    Past Medical History:  Diagnosis Date   Acute pharyngitis 09/01/2009   Acute upper respiratory infection 12/13/2021   Anemia 12/13/2021   ARF (acute renal failure) (HCC) 06/18/2012   Arthritis 04/09/2021   Asthma    Atypical chest pain 04/08/2022   BBB (bundle branch block)    left   Breast cancer (HCC) 2004   Left Breast Cancer   C. difficile colitis 06/21/2012   Cardiomyopathy (HCC)    Congestive heart failure (CHF) (HCC) 12/03/2022   Dilated cardiomyopathy (HCC) 12/13/2021   Disorder of bone 01/25/2010   Diverticula of intestine 04/25/2020   Diverticulitis 06/14/2012   Diverticulitis of colon 07/01/2012   Drug-induced myopathy 12/13/2021  Encounter for general adult medical examination without abnormal findings 12/18/2015   Enterocolitis due to Clostridium difficile 07/01/2012   Essential hypertension 10/14/1998   Fatigue 01/25/2010   Fever 03/20/2010   GERD 04/10/2010   Qualifier: Diagnosis of   By: Delton Coombes MD, [place]        GERD (gastroesophageal reflux disease)    Hardening of the aorta (main artery of the heart) (HCC) 12/13/2021   Healthcare-associated pneumonia 06/14/2012   Hyperlipidemia 04/25/2020   Hypertrophic  condition of skin 04/11/2015   Hypothyroidism 01/25/2010   Impacted cerumen 12/13/2021   Impaired fasting glucose 04/25/2020   Laryngopharyngeal reflux (LPR) 07/17/2015   Has been doing well while using just Dexilant 60mg  daily without any ranitidine in the evening. However since her flare of asthma she has has some reflux event up to her chest.      Left lower quadrant pain 05/15/2016   Liver cyst 12/13/2021   Malignant neoplasm of female breast (HCC) 04/10/2010   Qualifier: History of   By: Delton Coombes MD, [place]        Mild persistent asthma 07/17/2015   Presents today with a three day history of wheezing, coughing, irritated throat, and SABA use as a result of Cat exposure on Friday. No chills, no fever, some sputum that may be slightly tinged yellow color on rare occasion. Was doing wonderful with any exacerbation in a year and minimal requirement for a bronchodilator until this event.     Neck pain 02/28/2017   Obesity 07/01/2012   Osteopenia 12/13/2021   Pain in left arm 03/17/2019   Pain in left hip 05/15/2016   Personal history of malignant neoplasm of breast 12/13/2021   Proteinuria 04/27/2020   Seasonal allergic rhinitis 01/25/2010   Stable AR without much problem this year while using her flonase intermittently      Severe persistent asthma without complication 07/26/2020   Thyroid disease    Underimmunization status 07/01/2012   Ventricular premature depolarization 07/01/2011    Past Surgical History:  Procedure Laterality Date   BREAST LUMPECTOMY Left 2004   malignant   BREAST SURGERY     STERILIZATION     THYROIDECTOMY     TONSILLECTOMY     TUBAL LIGATION      Review of systems negative except as noted in HPI / PMHx or noted below:  Review of Systems  Constitutional: Negative.   HENT: Negative.    Eyes: Negative.   Respiratory: Negative.    Cardiovascular: Negative.   Gastrointestinal: Negative.   Genitourinary: Negative.   Musculoskeletal: Negative.    Skin: Negative.   Neurological: Negative.   Endo/Heme/Allergies: Negative.   Psychiatric/Behavioral: Negative.       Objective:   Vitals:   11/13/23 1010  BP: 118/62  Pulse: 63  Resp: 16  SpO2: 96%   Height: 5\' 3"  (160 cm)  Weight: 176 lb 3.2 oz (79.9 kg)   Physical Exam Constitutional:      Appearance: She is not diaphoretic.  HENT:     Head: Normocephalic.     Right Ear: Tympanic membrane, ear canal and external ear normal.     Left Ear: Tympanic membrane, ear canal and external ear normal.     Nose: Nose normal. No mucosal edema or rhinorrhea.     Mouth/Throat:     Pharynx: Uvula midline. No oropharyngeal exudate.  Eyes:     Conjunctiva/sclera: Conjunctivae normal.  Neck:     Thyroid: No thyromegaly.     Trachea: Trachea  normal. No tracheal tenderness or tracheal deviation.  Cardiovascular:     Rate and Rhythm: Normal rate and regular rhythm.     Heart sounds: Normal heart sounds, S1 normal and S2 normal. No murmur heard. Pulmonary:     Effort: No respiratory distress.     Breath sounds: Normal breath sounds. No stridor. No wheezing or rales.  Lymphadenopathy:     Head:     Right side of head: No tonsillar adenopathy.     Left side of head: No tonsillar adenopathy.     Cervical: No cervical adenopathy.  Skin:    Findings: No erythema or rash.     Nails: There is no clubbing.  Neurological:     Mental Status: She is alert.     Diagnostics: Spirometry was performed and demonstrated an FEV1 of 1.57 at 79 % of predicted.  Assessment and Plan:   1. Asthma, severe persistent, well-controlled   2. Other allergic rhinitis   3. Laryngopharyngeal reflux (LPR)   4. Local reaction to hymenoptera sting    1. Continue Omeprazole 40 mg - 1 tablet daily  2. If needed:  A. Claritin 10 mg - 1 tablet 1 time per day B. AIRSUPRA - 2 inhalations every 4-6 hours  C. Flonase - 1 spray each nostril 1-2 times per day  3. Return in 6 months or earlier if problem  4.  Influenza = Tamiflu. Covid = Paxlovid  5. If swelling reaction distant from sting site will require further evaluation.   Breezy is really doing very well on her current therapy which includes a collection of anti-inflammatory medications for her airway if needed and consistent use of omeprazole for her reflux.  She did have a large local reaction to hymenoptera venom exposure recently but she had no associated systemic or constitutional symptoms and thus is not at risk for a systemic reaction at this point in time with subsequent exposure to hymenoptera venom.  I have asked her to contact me if she ever develops any type of reaction distant from her sting site.  Will see her back in this clinic in 6 months or earlier if there is a problem.   Laurette Schimke, MD Allergy / Immunology [facility]

## 2023-11-17 ENCOUNTER — Encounter: Payer: Self-pay | Admitting: Allergy and Immunology

## 2023-12-15 ENCOUNTER — Other Ambulatory Visit: Payer: Self-pay

## 2023-12-15 ENCOUNTER — Other Ambulatory Visit: Payer: Self-pay | Admitting: Allergy and Immunology

## 2023-12-15 ENCOUNTER — Other Ambulatory Visit (HOSPITAL_COMMUNITY): Payer: Self-pay

## 2023-12-15 MED ORDER — IBANDRONATE SODIUM 150 MG PO TABS
150.0000 mg | ORAL_TABLET | ORAL | 2 refills | Status: DC
  Filled 2023-12-15: qty 3, 90d supply, fill #0
  Filled 2024-03-29: qty 3, 90d supply, fill #1
  Filled 2024-06-28: qty 3, 90d supply, fill #2

## 2023-12-18 ENCOUNTER — Other Ambulatory Visit (HOSPITAL_COMMUNITY): Payer: Self-pay

## 2023-12-19 ENCOUNTER — Other Ambulatory Visit (HOSPITAL_COMMUNITY): Payer: Self-pay

## 2023-12-24 ENCOUNTER — Other Ambulatory Visit (HOSPITAL_COMMUNITY): Payer: Self-pay

## 2023-12-29 ENCOUNTER — Other Ambulatory Visit: Payer: Self-pay

## 2023-12-29 ENCOUNTER — Other Ambulatory Visit (HOSPITAL_COMMUNITY): Payer: Self-pay

## 2023-12-29 MED ORDER — SIMVASTATIN 20 MG PO TABS
20.0000 mg | ORAL_TABLET | Freq: Every evening | ORAL | 1 refills | Status: DC
  Filled 2023-12-29: qty 90, 90d supply, fill #0
  Filled 2024-03-29: qty 90, 90d supply, fill #1

## 2024-01-25 ENCOUNTER — Other Ambulatory Visit: Payer: Self-pay | Admitting: Cardiology

## 2024-01-26 ENCOUNTER — Other Ambulatory Visit (HOSPITAL_COMMUNITY): Payer: Self-pay

## 2024-01-26 MED ORDER — DAPAGLIFLOZIN PROPANEDIOL 5 MG PO TABS
5.0000 mg | ORAL_TABLET | Freq: Every day | ORAL | 3 refills | Status: AC
  Filled 2024-01-26: qty 90, 90d supply, fill #0
  Filled 2024-04-26 (×2): qty 90, 90d supply, fill #1
  Filled 2024-07-26: qty 90, 90d supply, fill #2
  Filled 2024-10-25: qty 90, 90d supply, fill #3

## 2024-01-26 MED ORDER — ENTRESTO 49-51 MG PO TABS
1.0000 | ORAL_TABLET | Freq: Two times a day (BID) | ORAL | 3 refills | Status: DC
  Filled 2024-01-26: qty 180, 90d supply, fill #0
  Filled 2024-04-26 (×2): qty 180, 90d supply, fill #1
  Filled 2024-07-26: qty 180, 90d supply, fill #2
  Filled 2024-10-25: qty 60, 30d supply, fill #3

## 2024-01-29 ENCOUNTER — Ambulatory Visit: Attending: Cardiology

## 2024-01-29 DIAGNOSIS — I420 Dilated cardiomyopathy: Secondary | ICD-10-CM | POA: Diagnosis not present

## 2024-01-29 LAB — ECHOCARDIOGRAM COMPLETE
Area-P 1/2: 3.37 cm2
S' Lateral: 3.2 cm

## 2024-02-03 ENCOUNTER — Telehealth: Payer: Self-pay

## 2024-02-03 NOTE — Telephone Encounter (Signed)
 Left message on My Chart with Echo results per Dr. Vanetta Shawl note. Routed to PCP.

## 2024-02-06 ENCOUNTER — Other Ambulatory Visit (HOSPITAL_COMMUNITY): Payer: Self-pay

## 2024-02-06 ENCOUNTER — Ambulatory Visit: Attending: Cardiology | Admitting: Cardiology

## 2024-02-06 ENCOUNTER — Encounter: Payer: Self-pay | Admitting: Cardiology

## 2024-02-06 VITALS — BP 110/68 | HR 74 | Ht 63.0 in | Wt 173.8 lb

## 2024-02-06 DIAGNOSIS — I420 Dilated cardiomyopathy: Secondary | ICD-10-CM

## 2024-02-06 DIAGNOSIS — I5042 Chronic combined systolic (congestive) and diastolic (congestive) heart failure: Secondary | ICD-10-CM | POA: Diagnosis not present

## 2024-02-06 DIAGNOSIS — I10 Essential (primary) hypertension: Secondary | ICD-10-CM

## 2024-02-06 DIAGNOSIS — I447 Left bundle-branch block, unspecified: Secondary | ICD-10-CM | POA: Diagnosis not present

## 2024-02-06 MED ORDER — SPIRONOLACTONE 25 MG PO TABS
12.5000 mg | ORAL_TABLET | Freq: Every day | ORAL | 3 refills | Status: AC
  Filled 2024-02-06: qty 90, 180d supply, fill #0
  Filled 2024-05-31: qty 45, 90d supply, fill #0
  Filled 2024-09-12: qty 45, 90d supply, fill #1

## 2024-02-06 NOTE — Addendum Note (Signed)
 Addended by: Shawnee Dellen D on: 02/06/2024 10:29 AM   Modules accepted: Orders

## 2024-02-06 NOTE — Patient Instructions (Signed)
 Medication Instructions:   DECREASE: Spironolactone  to 12.5mg  1 tablet daily   Lab Work: None Ordered If you have labs (blood work) drawn today and your tests are completely normal, you will receive your results only by: MyChart Message (if you have MyChart) OR A paper copy in the mail If you have any lab test that is abnormal or we need to change your treatment, we will call you to review the results.   Testing/Procedures: None Ordered   Follow-Up: At [facility], you and your health needs are our priority.  As part of our continuing mission to provide you with exceptional heart care, we have created designated Provider Care Teams.  These Care Teams include your primary Cardiologist (physician) and Advanced Practice Providers (APPs -  Physician Assistants and Nurse Practitioners) who all work together to provide you with the care you need, when you need it.  We recommend signing up for the patient portal called "MyChart".  Sign up information is provided on this After Visit Summary.  MyChart is used to connect with patients for Virtual Visits (Telemedicine).  Patients are able to view lab/test results, encounter notes, upcoming appointments, etc.  Non-urgent messages can be sent to your provider as well.   To learn more about what you can do with MyChart, go to [email or web address].    Your next appointment:   6 month(s)  The format for your next appointment:   In Person  Provider:   Ralene Burger, MD    Other Instructions NA

## 2024-02-06 NOTE — Progress Notes (Signed)
 Cardiology Office Note:    Date:  02/06/2024   ID:  Allison Clark, DOB May 12, 1951, MRN [id number]  PCP:  Aldo Hun, MD  Cardiologist:  Ralene Burger, MD    Referring MD: Aldo Hun, MD   No chief complaint on file.   History of Present Illness:    Allison Clark is a 73 y.o. female past medical history significant for cardiomyopathy initially diagnosed with ejection fraction 20-25 but after that guideline directed medical therapy show improvement to normalization, chronic left bundle branch block, coronary CT angio showed only minimal disease clearly does not explain cardiomyopathy.  She is doing great she is asymptomatic is able to walk climb stairs with no difficulties.  She described however episodes when she is very weak tired exhausted especially with weather is very hot  Past Medical History:  Diagnosis Date   Acute pharyngitis 09/01/2009   Acute upper respiratory infection 12/13/2021   Anemia 12/13/2021   ARF (acute renal failure) (HCC) 06/18/2012   Arthritis 04/09/2021   Asthma    Atypical chest pain 04/08/2022   BBB (bundle branch block)    left   Breast cancer (HCC) 2004   Left Breast Cancer   C. difficile colitis 06/21/2012   Cardiomyopathy (HCC)    Congestive heart failure (CHF) (HCC) 12/03/2022   Dilated cardiomyopathy (HCC) 12/13/2021   Disorder of bone 01/25/2010   Diverticula of intestine 04/25/2020   Diverticulitis 06/14/2012   Diverticulitis of colon 07/01/2012   Drug-induced myopathy 12/13/2021   Encounter for general adult medical examination without abnormal findings 12/18/2015   Enterocolitis due to Clostridium difficile 07/01/2012   Essential hypertension 10/14/1998   Fatigue 01/25/2010   Fever 03/20/2010   GERD 04/10/2010   Qualifier: Diagnosis of   By: Baldwin Levee MD, [place]        GERD (gastroesophageal reflux disease)    Hardening of the aorta (main artery of the heart) (HCC) 12/13/2021   Healthcare-associated pneumonia 06/14/2012    Hyperlipidemia 04/25/2020   Hypertrophic condition of skin 04/11/2015   Hypothyroidism 01/25/2010   Impacted cerumen 12/13/2021   Impaired fasting glucose 04/25/2020   Laryngopharyngeal reflux (LPR) 07/17/2015   Has been doing well while using just Dexilant  60mg  daily without any ranitidine  in the evening. However since her flare of asthma she has has some reflux event up to her chest.      Left lower quadrant pain 05/15/2016   Liver cyst 12/13/2021   Malignant neoplasm of female breast (HCC) 04/10/2010   Qualifier: History of   By: Baldwin Levee MD, [place]        Mild persistent asthma 07/17/2015   Presents today with a three day history of wheezing, coughing, irritated throat, and SABA use as a result of Cat exposure on Friday. No chills, no fever, some sputum that may be slightly tinged yellow color on rare occasion. Was doing wonderful with any exacerbation in a year and minimal requirement for a bronchodilator until this event.     Neck pain 02/28/2017   Obesity 07/01/2012   Osteopenia 12/13/2021   Pain in left arm 03/17/2019   Pain in left hip 05/15/2016   Personal history of malignant neoplasm of breast 12/13/2021   Proteinuria 04/27/2020   Seasonal allergic rhinitis 01/25/2010   Stable AR without much problem this year while using her flonase  intermittently      Severe persistent asthma without complication 07/26/2020   Thyroid  disease    Underimmunization status 07/01/2012   Ventricular premature depolarization 07/01/2011  Past Surgical History:  Procedure Laterality Date   BREAST LUMPECTOMY Left 2004   malignant   BREAST SURGERY     STERILIZATION     THYROIDECTOMY     TONSILLECTOMY     TUBAL LIGATION      Current Medications: Current Meds  Medication Sig   Albuterol -Budesonide  (AIRSUPRA) 90-80 MCG/ACT AERO Inhale 1 puff into the lungs as needed (SOb).   Cholecalciferol (VITAMIN D3 PO) Take 1 tablet by mouth daily.   dapagliflozin  propanediol (FARXIGA ) 5 MG TABS  tablet Take 1 tablet (5 mg total) by mouth daily before breakfast.   fluticasone  (FLONASE ) 50 MCG/ACT nasal spray Place 1 spray into both nostrils daily.    ibandronate  (BONIVA ) 150 MG tablet Take 1 tablet (150 mg total) by mouth every 30 (thirty) days.   levothyroxine  (SYNTHROID , LEVOTHROID) 112 MCG tablet Take 112 mcg by mouth daily before breakfast.    loratadine  (CLARITIN ) 10 MG tablet Take 10 mg by mouth daily.   metoprolol  succinate (TOPROL -XL) 50 MG 24 hr tablet Take 1 tablet (50 mg total) by mouth daily. Take with or immediately following a meal.   omeprazole  (PRILOSEC) 40 MG capsule TAKE 1 CAPSULE BY MOUTH 1 TO 2 TIMES PER DAY (Patient taking differently: Take 40 mg by mouth daily. TAKE 1 CAPSULE BY MOUTH 1 TO 2 TIMES PER DAY)   sacubitril -valsartan  (ENTRESTO ) 49-51 MG Take 1 tablet by mouth 2 (two) times daily.   simvastatin  (ZOCOR ) 20 MG tablet Take 1 tablet (20 mg total) by mouth every evening.   spironolactone  (ALDACTONE ) 25 MG tablet Take 1 tablet (25 mg total) by mouth daily.   SYNTHROID  112 MCG tablet Take 1 tablet (112 mcg total) by mouth daily.     Allergies:   Avelox [moxifloxacin hcl in nacl], Morphine , and Sulfa antibiotics   Social History   Socioeconomic History   Marital status: Married    Spouse name: Not on file   Number of children: Not on file   Years of education: Not on file   Highest education level: Not on file  Occupational History   Not on file  Tobacco Use   Smoking status: Never   Smokeless tobacco: Never  Vaping Use   Vaping status: Never Used  Substance and Sexual Activity   Alcohol  use: No   Drug use: No   Sexual activity: Not Currently  Other Topics Concern   Not on file  Social History Narrative   Not on file   Social Drivers of Health   Financial Resource Strain: Not on file  Food Insecurity: Not on file  Transportation Needs: Not on file  Physical Activity: Not on file  Stress: Not on file  Social Connections: Not on file      Family History: The patient's family history includes Allergies in her son. There is no history of Allergic rhinitis, Angioedema, Asthma, Eczema, Immunodeficiency, Urticaria, Breast cancer, or Sleep apnea. ROS:   Please see the history of present illness.    All 14 point review of systems negative except as described per history of present illness  EKGs/Labs/Other Studies Reviewed:    EKG Interpretation Date/Time:  Friday February 06 2024 09:39:25 EDT Ventricular Rate:  74 PR Interval:  186 QRS Duration:  158 QT Interval:  442 QTC Calculation: 490 R Axis:   -81  Text Interpretation: Normal sinus rhythm Left axis deviation Left bundle branch block Abnormal ECG When compared with ECG of 21-Feb-2017 16:57, PREVIOUS ECG IS PRESENT Confirmed by Ralene Burger [phone])  on 02/06/2024 9:46:54 AM    Recent Labs: No results found for requested labs within last 365 days.  Recent Lipid Panel No results found for: "CHOL", "TRIG", "HDL", "CHOLHDL", "VLDL", "LDLCALC", "LDLDIRECT"  Physical Exam:    VS:  BP 110/68 (BP Location: Right Arm, Patient Position: Lying right side)   Pulse 74   Ht 5\' 3"  (1.6 m)   Wt 173 lb 12.8 oz (78.8 kg)   SpO2 90%   BMI 30.79 kg/m     Wt Readings from Last 3 Encounters:  02/06/24 173 lb 12.8 oz (78.8 kg)  11/13/23 176 lb 3.2 oz (79.9 kg)  08/08/23 171 lb 9.6 oz (77.8 kg)     GEN:  Well nourished, well developed in no acute distress HEENT: Normal NECK: No JVD; No carotid bruits LYMPHATICS: No lymphadenopathy CARDIAC: RRR, no murmurs, no rubs, no gallops RESPIRATORY:  Clear to auscultation without rales, wheezing or rhonchi  ABDOMEN: Soft, non-tender, non-distended MUSCULOSKELETAL:  No edema; No deformity  SKIN: Warm and dry LOWER EXTREMITIES: no swelling NEUROLOGIC:  Alert and oriented x 3 PSYCHIATRIC:  Normal affect   ASSESSMENT:    1. Essential hypertension   2. Dilated cardiomyopathy (HCC)   3. Chronic combined systolic and diastolic  congestive heart failure (HCC)   4. Left bundle branch block    PLAN:    In order of problems listed above:  Cardiomyopathy normalization I will cut down her Aldactone  to only 12.5 daily rest of the medication will be the same. History of dilated cardiomyopathy, normal left ventricular ejection fraction baseline last echo continue guideline directed medical therapy. Chronic combined systolic diastolic congestive heart failure stable. Left bundle branch block noted. Dyslipidemia I did review K PN which show me data from September 24, 2023 LDL 80 HDL 45 she is taking Zocor  20 which I will continue   Medication Adjustments/Labs and Tests Ordered: Current medicines are reviewed at length with the patient today.  Concerns regarding medicines are outlined above.  Orders Placed This Encounter  Procedures   EKG 12-Lead   Medication changes: No orders of the defined types were placed in this encounter.   Signed, Manfred Seed, MD, [facility] 02/06/2024 10:13 AM    [facility]

## 2024-02-09 ENCOUNTER — Telehealth: Payer: Self-pay

## 2024-02-09 NOTE — Telephone Encounter (Signed)
 Pt viewed Echo results on My Chart per Dr. Vanetta Shawl note. Routed to PCP.

## 2024-03-16 ENCOUNTER — Other Ambulatory Visit (HOSPITAL_COMMUNITY): Payer: Self-pay

## 2024-03-16 DIAGNOSIS — J302 Other seasonal allergic rhinitis: Secondary | ICD-10-CM | POA: Diagnosis not present

## 2024-03-16 DIAGNOSIS — H6692 Otitis media, unspecified, left ear: Secondary | ICD-10-CM | POA: Diagnosis not present

## 2024-03-16 MED ORDER — NEOMYCIN-POLYMYXIN-HC 3.5-10000-1 OT SUSP
4.0000 [drp] | Freq: Three times a day (TID) | OTIC | 0 refills | Status: DC
  Filled 2024-03-16: qty 10, 5d supply, fill #0

## 2024-03-16 MED ORDER — AZITHROMYCIN 250 MG PO TABS
ORAL_TABLET | ORAL | 0 refills | Status: DC
  Filled 2024-03-16: qty 6, 5d supply, fill #0

## 2024-03-29 ENCOUNTER — Other Ambulatory Visit: Payer: Self-pay

## 2024-03-29 ENCOUNTER — Other Ambulatory Visit: Payer: Self-pay | Admitting: Cardiology

## 2024-03-31 ENCOUNTER — Other Ambulatory Visit (HOSPITAL_COMMUNITY): Payer: Self-pay

## 2024-03-31 ENCOUNTER — Other Ambulatory Visit: Payer: Self-pay

## 2024-03-31 MED ORDER — METOPROLOL SUCCINATE ER 50 MG PO TB24
50.0000 mg | ORAL_TABLET | Freq: Every day | ORAL | 3 refills | Status: AC
  Filled 2024-03-31: qty 90, 90d supply, fill #0
  Filled 2024-06-28: qty 90, 90d supply, fill #1
  Filled 2024-09-27: qty 90, 90d supply, fill #2

## 2024-04-08 ENCOUNTER — Other Ambulatory Visit: Payer: Self-pay | Admitting: Internal Medicine

## 2024-04-08 DIAGNOSIS — Z1231 Encounter for screening mammogram for malignant neoplasm of breast: Secondary | ICD-10-CM

## 2024-04-26 ENCOUNTER — Other Ambulatory Visit (HOSPITAL_COMMUNITY): Payer: Self-pay

## 2024-04-26 ENCOUNTER — Other Ambulatory Visit: Payer: Self-pay

## 2024-04-27 ENCOUNTER — Other Ambulatory Visit: Payer: Self-pay

## 2024-04-27 ENCOUNTER — Other Ambulatory Visit (HOSPITAL_COMMUNITY): Payer: Self-pay

## 2024-05-04 ENCOUNTER — Ambulatory Visit
Admission: RE | Admit: 2024-05-04 | Discharge: 2024-05-04 | Disposition: A | Discharge status: Home / Self Care | Source: Ambulatory Visit | Attending: Internal Medicine | Admitting: Internal Medicine

## 2024-05-04 DIAGNOSIS — Z1231 Encounter for screening mammogram for malignant neoplasm of breast: Secondary | ICD-10-CM

## 2024-05-06 ENCOUNTER — Other Ambulatory Visit (HOSPITAL_COMMUNITY): Payer: Self-pay

## 2024-05-06 ENCOUNTER — Encounter: Payer: Self-pay | Admitting: Allergy and Immunology

## 2024-05-06 ENCOUNTER — Ambulatory Visit: Admitting: Allergy and Immunology

## 2024-05-06 VITALS — BP 134/72 | HR 72 | Resp 18 | Ht 62.6 in | Wt 180.6 lb

## 2024-05-06 DIAGNOSIS — K219 Gastro-esophageal reflux disease without esophagitis: Secondary | ICD-10-CM

## 2024-05-06 DIAGNOSIS — J3089 Other allergic rhinitis: Secondary | ICD-10-CM

## 2024-05-06 DIAGNOSIS — J4550 Severe persistent asthma, uncomplicated: Secondary | ICD-10-CM

## 2024-05-06 MED ORDER — IPRATROPIUM BROMIDE 0.06 % NA SOLN
NASAL | 11 refills | Status: AC
  Filled 2024-05-06: qty 15, 30d supply, fill #0
  Filled 2024-07-26: qty 15, 30d supply, fill #1
  Filled 2024-08-24: qty 15, 30d supply, fill #2
  Filled 2024-09-22: qty 15, 30d supply, fill #3

## 2024-05-06 NOTE — Patient Instructions (Addendum)
  1. Continue Omeprazole  40 mg - 1 tablet daily  2. If needed:  A. Claritin  10 mg - 1 tablet 1 time per day B. AIRSUPRA - 2 inhalations every 4-6 hours  C. Flonase  - 1 spray each nostril 1-2 times per day D. Ipratropium 0.06% -2 sprays each nostril every 6 hours to dry nose  3. Return in 12 months or earlier if problem  4. Influenza = Tamiflu. Covid = Paxlovid

## 2024-05-06 NOTE — Progress Notes (Signed)
  - [place] - [place] - [place] - [place]   Follow-up Note  Referring Provider: Shayne Anes, MD Primary Provider: Shayne Anes, MD Date of Office Visit: 05/06/2024  Subjective:   Allison Clark (DOB: 1951-03-16) is a 73 y.o. female who [facility] in re-evaluation of the following:  HPI: Allison Clark returns to this clinic in evaluation of asthma, allergic rhinitis, LPR, history of dilated cardiomyopathy.  Last saw her in this clinic 13 November 2023.  She has really done very well with her airway issue and has not required a systemic steroid or antibiotic for any type of airway problem and does not use the short acting bronchodilator and can exert herself to the extent that she so desires.  She intermittently uses an antihistamine and some nasal steroids and some nasal ipratropium.  Her reflux and her throat issues under very good control while using omeprazole  every day.  Allergies as of 05/06/2024       Reactions   Avelox [moxifloxacin Hcl In Nacl] Hives   Morphine  Rash   morphine    Sulfa Antibiotics Rash        Medication List    Airsupra 90-80 MCG/ACT Aero Generic drug: Albuterol -Budesonide  Inhale 1 puff into the lungs as needed (SOb).   Entresto  49-51 MG Generic drug: sacubitril -valsartan  Take 1 tablet by mouth 2 (two) times daily.   Farxiga  5 MG Tabs tablet Generic drug: dapagliflozin  propanediol Take 1 tablet (5 mg total) by mouth daily before breakfast.   fluticasone  50 MCG/ACT nasal spray Commonly known as: FLONASE  Place 1 spray into both nostrils daily.   ibandronate  150 MG tablet Commonly known as: BONIVA  Take 1 tablet (150 mg total) by mouth every 30 (thirty) days.   ipratropium 0.06 % nasal spray Commonly known as: ATROVENT  Can use two sprays in each nostril every six hours as needed to dry up nose. Started by: Worthy Boschert J Leba Tibbitts   loratadine  10 MG tablet Commonly known as: CLARITIN  Take 10  mg by mouth daily.   metoprolol  succinate 50 MG 24 hr tablet Commonly known as: TOPROL -XL Take 1 tablet (50 mg total) by mouth daily. Take with or immediately following a meal.   omeprazole  40 MG capsule Commonly known as: PRILOSEC TAKE 1 CAPSULE BY MOUTH 1 TO 2 TIMES PER DAY   simvastatin  20 MG tablet Commonly known as: ZOCOR  Take 1 tablet (20 mg total) by mouth every evening.   spironolactone  25 MG tablet Commonly known as: ALDACTONE  Take 0.5 tablets (12.5 mg total) by mouth daily.   Synthroid  112 MCG tablet Generic drug: levothyroxine  Take 1 tablet (112 mcg total) by mouth daily.   VITAMIN D3 PO Take 1 tablet by mouth daily.    Past Medical History:  Diagnosis Date   Acute pharyngitis 09/01/2009   Acute upper respiratory infection 12/13/2021   Anemia 12/13/2021   ARF (acute renal failure) (HCC) 06/18/2012   Arthritis 04/09/2021   Asthma    Atypical chest pain 04/08/2022   BBB (bundle branch block)    left   Breast cancer (HCC) 2004   Left Breast Cancer   C. difficile colitis 06/21/2012   Cardiomyopathy (HCC)    Congestive heart failure (CHF) (HCC) 12/03/2022   Dilated cardiomyopathy (HCC) 12/13/2021   Disorder of bone 01/25/2010   Diverticula of intestine 04/25/2020   Diverticulitis 06/14/2012   Diverticulitis of colon 07/01/2012   Drug-induced myopathy 12/13/2021   Encounter for general adult medical examination without abnormal findings  12/18/2015   Enterocolitis due to Clostridium difficile 07/01/2012   Essential hypertension 10/14/1998   Fatigue 01/25/2010   Fever 03/20/2010   GERD 04/10/2010   Qualifier: Diagnosis of   By: Shelah MD, Lamar RAMAN        GERD (gastroesophageal reflux disease)    Hardening of the aorta (main artery of the heart) (HCC) 12/13/2021   Healthcare-associated pneumonia 06/14/2012   Hyperlipidemia 04/25/2020   Hypertrophic condition of skin 04/11/2015   Hypothyroidism 01/25/2010   Impacted cerumen 12/13/2021   Impaired fasting  glucose 04/25/2020   Laryngopharyngeal reflux (LPR) 07/17/2015   Has been doing well while using just Dexilant  60mg  daily without any ranitidine  in the evening. However since her flare of asthma she has has some reflux event up to her chest.      Left lower quadrant pain 05/15/2016   Liver cyst 12/13/2021   Malignant neoplasm of female breast (HCC) 04/10/2010   Qualifier: History of   By: Shelah MD, Lamar RAMAN        Mild persistent asthma 07/17/2015   Presents today with a three day history of wheezing, coughing, irritated throat, and SABA use as a result of Cat exposure on Friday. No chills, no fever, some sputum that may be slightly tinged yellow color on rare occasion. Was doing wonderful with any exacerbation in a year and minimal requirement for a bronchodilator until this event.     Neck pain 02/28/2017   Obesity 07/01/2012   Osteopenia 12/13/2021   Pain in left arm 03/17/2019   Pain in left hip 05/15/2016   Personal history of malignant neoplasm of breast 12/13/2021   Proteinuria 04/27/2020   Seasonal allergic rhinitis 01/25/2010   Stable AR without much problem this year while using her flonase  intermittently      Severe persistent asthma without complication 07/26/2020   Thyroid  disease    Underimmunization status 07/01/2012   Ventricular premature depolarization 07/01/2011    Past Surgical History:  Procedure Laterality Date   BREAST LUMPECTOMY Left 2004   malignant   BREAST SURGERY     STERILIZATION     THYROIDECTOMY     TONSILLECTOMY     TUBAL LIGATION      Review of systems negative except as noted in HPI / PMHx or noted below:  Review of Systems  Constitutional: Negative.   HENT: Negative.    Eyes: Negative.   Respiratory: Negative.    Cardiovascular: Negative.   Gastrointestinal: Negative.   Genitourinary: Negative.   Musculoskeletal: Negative.   Skin: Negative.   Neurological: Negative.   Endo/Heme/Allergies: Negative.   Psychiatric/Behavioral: Negative.        Objective:   Vitals:   05/06/24 1647  BP: 134/72  Pulse: 72  Resp: 18  SpO2: 92%   Height: 5' 2.6 (159 cm)  Weight: 180 lb 9.6 oz (81.9 kg)   Physical Exam Constitutional:      Appearance: She is not diaphoretic.  HENT:     Head: Normocephalic.     Right Ear: Tympanic membrane, ear canal and external ear normal.     Left Ear: Tympanic membrane, ear canal and external ear normal.     Nose: Nose normal. No mucosal edema or rhinorrhea.     Mouth/Throat:     Pharynx: Uvula midline. No oropharyngeal exudate.  Eyes:     Conjunctiva/sclera: Conjunctivae normal.  Neck:     Thyroid : No thyromegaly.     Trachea: Trachea normal. No tracheal tenderness or tracheal deviation.  Cardiovascular:  Rate and Rhythm: Normal rate and regular rhythm.     Heart sounds: Normal heart sounds, S1 normal and S2 normal. No murmur heard. Pulmonary:     Effort: No respiratory distress.     Breath sounds: Normal breath sounds. No stridor. No wheezing or rales.  Lymphadenopathy:     Head:     Right side of head: No tonsillar adenopathy.     Left side of head: No tonsillar adenopathy.     Cervical: No cervical adenopathy.  Skin:    Findings: No erythema or rash.     Nails: There is no clubbing.  Neurological:     Mental Status: She is alert.     Diagnostics: Spirometry was performed and demonstrated an FEV1 of 1.51 at 75 % of predicted.  Assessment and Plan:   1. Asthma, severe persistent, well-controlled   2. Other allergic rhinitis   3. Laryngopharyngeal reflux (LPR)    1. Continue Omeprazole  40 mg - 1 tablet daily  2. If needed:  A. Claritin  10 mg - 1 tablet 1 time per day B. AIRSUPRA - 2 inhalations every 4-6 hours  C. Flonase  - 1 spray each nostril 1-2 times per day D. Ipratropium 0.06% -2 sprays each nostril every 6 hours to dry nose  3. Return in 12 months or earlier if problem  4. Influenza = Tamiflu. Covid = Paxlovid   Allison Clark appears to be doing very well and  she has a good understanding of her disease state and how to use her medications appropriately depending on disease activity and I am going to refill her medications and see her back in this clinic in 1 year or earlier if there is a problem.  Camellia Denis, MD Allergy / Immunology [facility]

## 2024-05-08 ENCOUNTER — Other Ambulatory Visit (HOSPITAL_COMMUNITY): Payer: Self-pay

## 2024-05-10 ENCOUNTER — Encounter: Payer: Self-pay | Admitting: Allergy and Immunology

## 2024-05-12 ENCOUNTER — Ambulatory Visit: Payer: PPO | Admitting: Allergy and Immunology

## 2024-05-21 ENCOUNTER — Other Ambulatory Visit (HOSPITAL_COMMUNITY): Payer: Self-pay

## 2024-05-21 DIAGNOSIS — Z8619 Personal history of other infectious and parasitic diseases: Secondary | ICD-10-CM | POA: Diagnosis not present

## 2024-05-21 DIAGNOSIS — E785 Hyperlipidemia, unspecified: Secondary | ICD-10-CM | POA: Diagnosis not present

## 2024-05-21 DIAGNOSIS — J45909 Unspecified asthma, uncomplicated: Secondary | ICD-10-CM | POA: Diagnosis not present

## 2024-05-21 DIAGNOSIS — R7301 Impaired fasting glucose: Secondary | ICD-10-CM | POA: Diagnosis not present

## 2024-05-21 DIAGNOSIS — E039 Hypothyroidism, unspecified: Secondary | ICD-10-CM | POA: Diagnosis not present

## 2024-05-21 DIAGNOSIS — I2510 Atherosclerotic heart disease of native coronary artery without angina pectoris: Secondary | ICD-10-CM | POA: Diagnosis not present

## 2024-05-21 DIAGNOSIS — G720 Drug-induced myopathy: Secondary | ICD-10-CM | POA: Diagnosis not present

## 2024-05-21 DIAGNOSIS — Z853 Personal history of malignant neoplasm of breast: Secondary | ICD-10-CM | POA: Diagnosis not present

## 2024-05-21 DIAGNOSIS — E669 Obesity, unspecified: Secondary | ICD-10-CM | POA: Diagnosis not present

## 2024-05-21 DIAGNOSIS — I10 Essential (primary) hypertension: Secondary | ICD-10-CM | POA: Diagnosis not present

## 2024-05-21 DIAGNOSIS — I420 Dilated cardiomyopathy: Secondary | ICD-10-CM | POA: Diagnosis not present

## 2024-05-21 DIAGNOSIS — I700 Atherosclerosis of aorta: Secondary | ICD-10-CM | POA: Diagnosis not present

## 2024-05-21 DIAGNOSIS — I493 Ventricular premature depolarization: Secondary | ICD-10-CM | POA: Diagnosis not present

## 2024-05-21 MED ORDER — MUPIROCIN 2 % EX OINT
1.0000 | TOPICAL_OINTMENT | Freq: Three times a day (TID) | CUTANEOUS | 5 refills | Status: AC
  Filled 2024-05-21: qty 22, 8d supply, fill #0
  Filled 2024-10-10: qty 22, 8d supply, fill #1

## 2024-05-31 ENCOUNTER — Other Ambulatory Visit (HOSPITAL_COMMUNITY): Payer: Self-pay

## 2024-05-31 ENCOUNTER — Other Ambulatory Visit: Payer: Self-pay

## 2024-06-22 DIAGNOSIS — Z23 Encounter for immunization: Secondary | ICD-10-CM | POA: Diagnosis not present

## 2024-06-28 ENCOUNTER — Other Ambulatory Visit (HOSPITAL_COMMUNITY): Payer: Self-pay

## 2024-06-29 ENCOUNTER — Other Ambulatory Visit (HOSPITAL_COMMUNITY): Payer: Self-pay

## 2024-06-29 ENCOUNTER — Other Ambulatory Visit: Payer: Self-pay

## 2024-06-29 MED ORDER — SIMVASTATIN 20 MG PO TABS
20.0000 mg | ORAL_TABLET | Freq: Every evening | ORAL | 3 refills | Status: AC
  Filled 2024-06-29: qty 90, 90d supply, fill #0
  Filled 2024-09-27: qty 90, 90d supply, fill #1

## 2024-06-30 ENCOUNTER — Other Ambulatory Visit: Payer: Self-pay

## 2024-07-26 ENCOUNTER — Other Ambulatory Visit (HOSPITAL_COMMUNITY): Payer: Self-pay

## 2024-08-09 ENCOUNTER — Encounter: Payer: Self-pay | Admitting: *Deleted

## 2024-08-09 ENCOUNTER — Encounter: Payer: Self-pay | Admitting: Cardiology

## 2024-08-09 ENCOUNTER — Ambulatory Visit: Attending: Cardiology | Admitting: Cardiology

## 2024-08-09 VITALS — BP 130/60 | HR 86 | Ht 62.6 in | Wt 179.0 lb

## 2024-08-09 DIAGNOSIS — I447 Left bundle-branch block, unspecified: Secondary | ICD-10-CM

## 2024-08-09 DIAGNOSIS — E782 Mixed hyperlipidemia: Secondary | ICD-10-CM

## 2024-08-09 DIAGNOSIS — I10 Essential (primary) hypertension: Secondary | ICD-10-CM | POA: Diagnosis not present

## 2024-08-09 DIAGNOSIS — Z8619 Personal history of other infectious and parasitic diseases: Secondary | ICD-10-CM | POA: Insufficient documentation

## 2024-08-09 DIAGNOSIS — I420 Dilated cardiomyopathy: Secondary | ICD-10-CM | POA: Diagnosis not present

## 2024-08-09 NOTE — Progress Notes (Unsigned)
 Cardiology Office Note:    Date:  08/09/2024   ID:  Allison Clark, DOB 10-27-1950, MRN [id number]  PCP:  Shayne Anes, MD  Cardiologist:  Lamar Fitch, MD    Referring MD: Shayne Anes, MD   Chief Complaint  Patient presents with   Follow-up  Doing fine  History of Present Illness:    Allison Clark is a 73 y.o. female past medical history significant for cardiomyopathy initially diagnosed with ejection fraction 2025% but after being put on guideline directed medical therapy with improvement to normalization, also chronic left bundle branch block, course Teangi is only minimal disease clearly not explanation for her cardiomyopathy.  Comes today to months for follow-up overall doing well, denies have any chest pain tightness squeezing pressure burning chest no palpitation dizziness swelling of lower extremities overall doing fine  Past Medical History:  Diagnosis Date   Acute pharyngitis 09/01/2009   Acute upper respiratory infection 12/13/2021   Anemia 12/13/2021   ARF (acute renal failure) 06/18/2012   Arthritis 04/09/2021   Asthma    Atypical chest pain 04/08/2022   BBB (bundle branch block)    left   Breast cancer (HCC) 2004   Left Breast Cancer   C. difficile colitis 06/21/2012   Cardiomyopathy (HCC)    Congestive heart failure (CHF) (HCC) 12/03/2022   Dilated cardiomyopathy (HCC) 12/13/2021   Disorder of bone 01/25/2010   Diverticula of intestine 04/25/2020   Diverticulitis 06/14/2012   Diverticulitis of colon 07/01/2012   Drug-induced myopathy 12/13/2021   Encounter for general adult medical examination without abnormal findings 12/18/2015   Enterocolitis due to Clostridium difficile 07/01/2012   Essential hypertension 10/14/1998   Fatigue 01/25/2010   Fever 03/20/2010   GERD 04/10/2010   Qualifier: Diagnosis of   By: Shelah MD, Lamar RAMAN        GERD (gastroesophageal reflux disease)    Hardening of the aorta (main artery of the heart) 12/13/2021    Healthcare-associated pneumonia 06/14/2012   Hyperlipidemia 04/25/2020   Hypertrophic condition of skin 04/11/2015   Hypothyroidism 01/25/2010   Impacted cerumen 12/13/2021   Impaired fasting glucose 04/25/2020   Laryngopharyngeal reflux (LPR) 07/17/2015   Has been doing well while using just Dexilant  60mg  daily without any ranitidine  in the evening. However since her flare of asthma she has has some reflux event up to her chest.      Left lower quadrant pain 05/15/2016   Liver cyst 12/13/2021   Malignant neoplasm of female breast (HCC) 04/10/2010   Qualifier: History of   By: Shelah MD, Lamar RAMAN        Mild persistent asthma 07/17/2015   Presents today with a three day history of wheezing, coughing, irritated throat, and SABA use as a result of Cat exposure on Friday. No chills, no fever, some sputum that may be slightly tinged yellow color on rare occasion. Was doing wonderful with any exacerbation in a year and minimal requirement for a bronchodilator until this event.     Neck pain 02/28/2017   Obesity 07/01/2012   Osteopenia 12/13/2021   Pain in left arm 03/17/2019   Pain in left hip 05/15/2016   Personal history of malignant neoplasm of breast 12/13/2021   Proteinuria 04/27/2020   Seasonal allergic rhinitis 01/25/2010   Stable AR without much problem this year while using her flonase  intermittently      Severe persistent asthma without complication (HCC) 07/26/2020   Thyroid  disease    Underimmunization status 07/01/2012   Ventricular premature  depolarization 07/01/2011    Past Surgical History:  Procedure Laterality Date   BREAST LUMPECTOMY Left 2004   malignant   BREAST SURGERY     STERILIZATION     THYROIDECTOMY     TONSILLECTOMY     TUBAL LIGATION      Current Medications: Current Meds  Medication Sig   Albuterol -Budesonide  (AIRSUPRA) 90-80 MCG/ACT AERO Inhale 1 puff into the lungs as needed (SOb).   Cholecalciferol (VITAMIN D3 PO) Take 1 tablet by mouth daily.    dapagliflozin  propanediol (FARXIGA ) 5 MG TABS tablet Take 1 tablet (5 mg total) by mouth daily before breakfast.   fluticasone  (FLONASE ) 50 MCG/ACT nasal spray Place 1 spray into both nostrils daily.    ibandronate  (BONIVA ) 150 MG tablet Take 1 tablet (150 mg total) by mouth every 30 (thirty) days.   ipratropium (ATROVENT ) 0.06 % nasal spray Can use two sprays in each nostril every six hours as needed to dry up nose.   loratadine  (CLARITIN ) 10 MG tablet Take 10 mg by mouth daily.   metoprolol  succinate (TOPROL -XL) 50 MG 24 hr tablet Take 1 tablet (50 mg total) by mouth daily. Take with or immediately following a meal.   mupirocin  ointment (BACTROBAN ) 2 % Apply topically up to 3 times daily to treat or prevent skin infection   omeprazole  (PRILOSEC) 40 MG capsule TAKE 1 CAPSULE BY MOUTH 1 TO 2 TIMES PER DAY   sacubitril -valsartan  (ENTRESTO ) 49-51 MG Take 1 tablet by mouth 2 (two) times daily.   simvastatin  (ZOCOR ) 20 MG tablet Take 1 tablet (20 mg total) by mouth every evening.   spironolactone  (ALDACTONE ) 25 MG tablet Take 0.5 tablets (12.5 mg total) by mouth daily.   SYNTHROID  112 MCG tablet Take 1 tablet (112 mcg total) by mouth daily.     Allergies:   Avelox [moxifloxacin hcl in nacl], Morphine , and Sulfa antibiotics   Social History   Socioeconomic History   Marital status: Married    Spouse name: Not on file   Number of children: Not on file   Years of education: Not on file   Highest education level: Not on file  Occupational History   Not on file  Tobacco Use   Smoking status: Never   Smokeless tobacco: Never  Vaping Use   Vaping status: Never Used  Substance and Sexual Activity   Alcohol  use: No   Drug use: No   Sexual activity: Not Currently  Other Topics Concern   Not on file  Social History Narrative   Not on file   Social Drivers of Health   Financial Resource Strain: Not on file  Food Insecurity: Not on file  Transportation Needs: Not on file  Physical  Activity: Not on file  Stress: Not on file  Social Connections: Not on file     Family History: The patient's family history includes Allergies in her son. There is no history of Allergic rhinitis, Angioedema, Asthma, Eczema, Immunodeficiency, Urticaria, Breast cancer, or Sleep apnea. ROS:   Please see the history of present illness.    All 14 point review of systems negative except as described per history of present illness  EKGs/Labs/Other Studies Reviewed:         Recent Labs: No results found for requested labs within last 365 days.  Recent Lipid Panel No results found for: CHOL, TRIG, HDL, CHOLHDL, VLDL, LDLCALC, LDLDIRECT  Physical Exam:    VS:  BP 130/60   Pulse 86   Ht 5' 2.6 (1.59 m)  Wt 179 lb (81.2 kg)   SpO2 93%   BMI 32.11 kg/m     Wt Readings from Last 3 Encounters:  08/09/24 179 lb (81.2 kg)  05/06/24 180 lb 9.6 oz (81.9 kg)  02/06/24 173 lb 12.8 oz (78.8 kg)     GEN:  Well nourished, well developed in no acute distress HEENT: Normal NECK: No JVD; No carotid bruits LYMPHATICS: No lymphadenopathy CARDIAC: RRR, no murmurs, no rubs, no gallops RESPIRATORY:  Clear to auscultation without rales, wheezing or rhonchi  ABDOMEN: Soft, non-tender, non-distended MUSCULOSKELETAL:  No edema; No deformity  SKIN: Warm and dry LOWER EXTREMITIES: no swelling NEUROLOGIC:  Alert and oriented x 3 PSYCHIATRIC:  Normal affect   ASSESSMENT:    1. Dilated cardiomyopathy (HCC)   2. Essential hypertension   3. Left bundle branch block   4. Mixed hyperlipidemia    PLAN:    In order of problems listed above:  History of dilated cardiomyopathy on guideline directed medical therapy echocardiogram from April of review showed preserved ejection fraction continue present management. Dyslipidemia I did review KPN which show me LDL 80 HDL 45, we will continue present management with simvastatin  20 mg daily. Left bundle branch block chronic  present. Essential hypertension blood pressure well-controlled   Medication Adjustments/Labs and Tests Ordered: Current medicines are reviewed at length with the patient today.  Concerns regarding medicines are outlined above.  No orders of the defined types were placed in this encounter.  Medication changes: No orders of the defined types were placed in this encounter.   Signed, Lamar DOROTHA Fitch, MD, [facility] 08/09/2024 4:41 PM    [facility]

## 2024-08-09 NOTE — Patient Instructions (Signed)
 Medication Instructions:  Your physician recommends that you continue on your current medications as directed. Please refer to the Current Medication list given to you today.  *If you need a refill on your cardiac medications before your next appointment, please call your pharmacy*  Lab Work: NONE If you have labs (blood work) drawn today and your tests are completely normal, you will receive your results only by: MyChart Message (if you have MyChart) OR A paper copy in the mail If you have any lab test that is abnormal or we need to change your treatment, we will call you to review the results.  Testing/Procedures: Your physician has requested that you have an echocardiogram. Echocardiography is a painless test that uses sound waves to create images of your heart. It provides your doctor with information about the size and shape of your heart and how well your heart's chambers and valves are working. This procedure takes approximately one hour. There are no restrictions for this procedure. Please do NOT wear cologne, perfume, aftershave, or lotions (deodorant is allowed). Please arrive 15 minutes prior to your appointment time.  Please note: We ask at that you not bring children with you during ultrasound (echo/ vascular) testing. Due to room size and safety concerns, children are not allowed in the ultrasound rooms during exams. Our front office staff cannot provide observation of children in our lobby area while testing is being conducted. An adult accompanying a patient to their appointment will only be allowed in the ultrasound room at the discretion of the ultrasound technician under special circumstances. We apologize for any inconvenience.   Follow-Up: At [facility], you and your health needs are our priority.  As part of our continuing mission to provide you with exceptional heart care, our providers are all part of one team.  This team includes your primary Cardiologist  (physician) and Advanced Practice Providers or APPs (Physician Assistants and Nurse Practitioners) who all work together to provide you with the care you need, when you need it.  Your next appointment:   6 month(s)  Provider:   Ralene Burger, MD    We recommend signing up for the patient portal called "MyChart".  Sign up information is provided on this After Visit Summary.  MyChart is used to connect with patients for Virtual Visits (Telemedicine).  Patients are able to view lab/test results, encounter notes, upcoming appointments, etc.  Non-urgent messages can be sent to your provider as well.   To learn more about what you can do with MyChart, go to [email or web address].   Other Instructions

## 2024-08-24 ENCOUNTER — Other Ambulatory Visit (HOSPITAL_COMMUNITY): Payer: Self-pay

## 2024-09-12 ENCOUNTER — Other Ambulatory Visit: Payer: Self-pay | Admitting: Allergy and Immunology

## 2024-09-13 ENCOUNTER — Other Ambulatory Visit: Payer: Self-pay

## 2024-09-13 ENCOUNTER — Other Ambulatory Visit (HOSPITAL_COMMUNITY): Payer: Self-pay

## 2024-09-13 MED ORDER — OMEPRAZOLE 40 MG PO CPDR
DELAYED_RELEASE_CAPSULE | ORAL | 5 refills | Status: AC
  Filled 2024-09-13: qty 60, 30d supply, fill #0
  Filled 2024-10-10: qty 60, 30d supply, fill #1

## 2024-09-27 ENCOUNTER — Other Ambulatory Visit (HOSPITAL_COMMUNITY): Payer: Self-pay

## 2024-09-27 ENCOUNTER — Other Ambulatory Visit: Payer: Self-pay

## 2024-09-27 MED ORDER — IBANDRONATE SODIUM 150 MG PO TABS
150.0000 mg | ORAL_TABLET | ORAL | 2 refills | Status: AC
  Filled 2024-09-27: qty 3, 90d supply, fill #0
  Filled 2024-11-17: qty 3, 90d supply, fill #1

## 2024-09-27 MED ORDER — LEVOTHYROXINE SODIUM 112 MCG PO TABS
112.0000 ug | ORAL_TABLET | Freq: Every morning | ORAL | 3 refills | Status: AC
  Filled 2024-09-27: qty 90, 90d supply, fill #0

## 2024-10-25 ENCOUNTER — Other Ambulatory Visit (HOSPITAL_COMMUNITY): Payer: Self-pay

## 2024-10-25 ENCOUNTER — Other Ambulatory Visit: Payer: Self-pay

## 2024-10-25 MED ORDER — LEVOTHYROXINE SODIUM 112 MCG PO TABS
112.0000 ug | ORAL_TABLET | Freq: Every morning | ORAL | 3 refills | Status: AC
  Filled 2024-10-25 (×2): qty 90, 90d supply, fill #0

## 2024-10-26 ENCOUNTER — Other Ambulatory Visit (HOSPITAL_COMMUNITY): Payer: Self-pay

## 2024-10-27 ENCOUNTER — Other Ambulatory Visit (HOSPITAL_COMMUNITY): Payer: Self-pay

## 2024-10-28 ENCOUNTER — Other Ambulatory Visit: Payer: Self-pay

## 2024-10-28 ENCOUNTER — Other Ambulatory Visit (HOSPITAL_COMMUNITY): Payer: Self-pay

## 2024-10-29 ENCOUNTER — Other Ambulatory Visit (HOSPITAL_COMMUNITY): Payer: Self-pay

## 2024-10-30 ENCOUNTER — Other Ambulatory Visit (HOSPITAL_COMMUNITY): Payer: Self-pay

## 2024-11-01 ENCOUNTER — Other Ambulatory Visit: Payer: Self-pay

## 2024-11-09 ENCOUNTER — Other Ambulatory Visit: Payer: Self-pay

## 2024-11-09 ENCOUNTER — Other Ambulatory Visit (HOSPITAL_COMMUNITY): Payer: Self-pay

## 2024-11-09 ENCOUNTER — Encounter: Payer: Self-pay | Admitting: Cardiology

## 2024-11-09 MED ORDER — SACUBITRIL-VALSARTAN 49-51 MG PO TABS
1.0000 | ORAL_TABLET | Freq: Two times a day (BID) | ORAL | 3 refills | Status: AC
  Filled 2024-11-09: qty 180, 90d supply, fill #0

## 2024-11-17 ENCOUNTER — Other Ambulatory Visit (HOSPITAL_COMMUNITY): Payer: Self-pay

## 2025-02-01 ENCOUNTER — Ambulatory Visit

## 2025-02-08 ENCOUNTER — Ambulatory Visit: Admitting: Cardiology

## 2025-05-04 ENCOUNTER — Ambulatory Visit: Admitting: Allergy and Immunology
